# Patient Record
Sex: Female | Born: 1980 | Race: Black or African American | Hispanic: No | Marital: Married | Smoking: Never smoker
Health system: Southern US, Community
[De-identification: ages and names within clinical notes are randomized; demographics above are authoritative.]

## PROBLEM LIST (undated history)

## (undated) DIAGNOSIS — L309 Dermatitis, unspecified: Secondary | ICD-10-CM

## (undated) DIAGNOSIS — E282 Polycystic ovarian syndrome: Secondary | ICD-10-CM

## (undated) DIAGNOSIS — T7840XA Allergy, unspecified, initial encounter: Secondary | ICD-10-CM

## (undated) DIAGNOSIS — J45909 Unspecified asthma, uncomplicated: Secondary | ICD-10-CM

## (undated) DIAGNOSIS — F32A Depression, unspecified: Secondary | ICD-10-CM

## (undated) DIAGNOSIS — N200 Calculus of kidney: Secondary | ICD-10-CM

## (undated) DIAGNOSIS — K219 Gastro-esophageal reflux disease without esophagitis: Secondary | ICD-10-CM

## (undated) DIAGNOSIS — F419 Anxiety disorder, unspecified: Secondary | ICD-10-CM

## (undated) DIAGNOSIS — G4730 Sleep apnea, unspecified: Secondary | ICD-10-CM

## (undated) DIAGNOSIS — L732 Hidradenitis suppurativa: Secondary | ICD-10-CM

## (undated) HISTORY — DX: Dermatitis, unspecified: L30.9

## (undated) HISTORY — DX: Gastro-esophageal reflux disease without esophagitis: K21.9

## (undated) HISTORY — DX: Unspecified asthma, uncomplicated: J45.909

## (undated) HISTORY — DX: Sleep apnea, unspecified: G47.30

## (undated) HISTORY — DX: Hidradenitis suppurativa: L73.2

## (undated) HISTORY — DX: Calculus of kidney: N20.0

## (undated) HISTORY — DX: Polycystic ovarian syndrome: E28.2

## (undated) HISTORY — PX: NECK SURGERY: SHX720

## (undated) HISTORY — PX: APPENDECTOMY: SHX54

## (undated) HISTORY — DX: Anxiety disorder, unspecified: F41.9

## (undated) HISTORY — DX: Allergy, unspecified, initial encounter: T78.40XA

## (undated) HISTORY — DX: Depression, unspecified: F32.A

---

## 2000-11-13 ENCOUNTER — Other Ambulatory Visit: Admission: RE | Admit: 2000-11-13 | Discharge: 2000-11-13 | Payer: Self-pay | Admitting: Obstetrics and Gynecology

## 2001-10-10 ENCOUNTER — Encounter: Admission: RE | Admit: 2001-10-10 | Discharge: 2002-01-08 | Payer: Self-pay | Admitting: Family Medicine

## 2001-10-23 ENCOUNTER — Ambulatory Visit (HOSPITAL_COMMUNITY): Admission: RE | Admit: 2001-10-23 | Discharge: 2001-10-23 | Payer: Self-pay | Admitting: Endocrinology

## 2003-07-09 ENCOUNTER — Encounter: Admission: RE | Admit: 2003-07-09 | Discharge: 2003-10-07 | Payer: Self-pay | Admitting: Endocrinology

## 2005-05-30 ENCOUNTER — Emergency Department (HOSPITAL_COMMUNITY): Admission: EM | Admit: 2005-05-30 | Discharge: 2005-05-30 | Payer: Self-pay | Admitting: Emergency Medicine

## 2006-05-01 DIAGNOSIS — N200 Calculus of kidney: Secondary | ICD-10-CM

## 2006-05-01 HISTORY — PX: LITHOTRIPSY: SUR834

## 2006-05-01 HISTORY — DX: Calculus of kidney: N20.0

## 2006-09-09 ENCOUNTER — Emergency Department (HOSPITAL_COMMUNITY): Admission: EM | Admit: 2006-09-09 | Discharge: 2006-09-09 | Payer: Self-pay | Admitting: Emergency Medicine

## 2006-09-10 ENCOUNTER — Inpatient Hospital Stay (HOSPITAL_COMMUNITY): Admission: EM | Admit: 2006-09-10 | Discharge: 2006-09-12 | Payer: Self-pay | Admitting: Emergency Medicine

## 2007-04-16 ENCOUNTER — Ambulatory Visit (HOSPITAL_COMMUNITY): Admission: RE | Admit: 2007-04-16 | Discharge: 2007-04-16 | Payer: Self-pay | Admitting: *Deleted

## 2007-08-15 ENCOUNTER — Inpatient Hospital Stay (HOSPITAL_COMMUNITY): Admission: AD | Admit: 2007-08-15 | Discharge: 2007-08-15 | Payer: Self-pay | Admitting: Obstetrics and Gynecology

## 2008-02-11 ENCOUNTER — Encounter: Admission: RE | Admit: 2008-02-11 | Discharge: 2008-02-11 | Payer: Self-pay | Admitting: Obstetrics and Gynecology

## 2008-02-14 ENCOUNTER — Inpatient Hospital Stay (HOSPITAL_COMMUNITY): Admission: AD | Admit: 2008-02-14 | Discharge: 2008-02-14 | Payer: Self-pay | Admitting: Obstetrics and Gynecology

## 2008-04-02 ENCOUNTER — Inpatient Hospital Stay (HOSPITAL_COMMUNITY): Admission: RE | Admit: 2008-04-02 | Discharge: 2008-04-05 | Payer: Self-pay | Admitting: Obstetrics and Gynecology

## 2009-04-11 ENCOUNTER — Emergency Department (HOSPITAL_COMMUNITY): Admission: EM | Admit: 2009-04-11 | Discharge: 2009-04-11 | Payer: Self-pay | Admitting: Emergency Medicine

## 2009-04-12 ENCOUNTER — Emergency Department (HOSPITAL_COMMUNITY): Admission: EM | Admit: 2009-04-12 | Discharge: 2009-04-12 | Payer: Self-pay | Admitting: Emergency Medicine

## 2009-04-19 ENCOUNTER — Ambulatory Visit (HOSPITAL_COMMUNITY): Admission: RE | Admit: 2009-04-19 | Discharge: 2009-04-19 | Payer: Self-pay | Admitting: Internal Medicine

## 2009-05-29 ENCOUNTER — Emergency Department (HOSPITAL_COMMUNITY): Admission: EM | Admit: 2009-05-29 | Discharge: 2009-05-29 | Payer: Self-pay | Admitting: Emergency Medicine

## 2009-09-17 ENCOUNTER — Emergency Department (HOSPITAL_COMMUNITY): Admission: EM | Admit: 2009-09-17 | Discharge: 2009-09-17 | Payer: Self-pay | Admitting: Emergency Medicine

## 2010-03-16 ENCOUNTER — Emergency Department (HOSPITAL_COMMUNITY): Admission: EM | Admit: 2010-03-16 | Discharge: 2010-03-16 | Payer: Self-pay | Admitting: Emergency Medicine

## 2010-07-18 LAB — URINE MICROSCOPIC-ADD ON

## 2010-07-18 LAB — URINALYSIS, ROUTINE W REFLEX MICROSCOPIC
Bilirubin Urine: NEGATIVE
Glucose, UA: NEGATIVE mg/dL
Ketones, ur: NEGATIVE mg/dL
Nitrite: NEGATIVE
Protein, ur: NEGATIVE mg/dL
Specific Gravity, Urine: 1.02 (ref 1.005–1.030)
Urobilinogen, UA: 0.2 mg/dL (ref 0.0–1.0)

## 2010-08-02 LAB — URINALYSIS, ROUTINE W REFLEX MICROSCOPIC
Bilirubin Urine: NEGATIVE
Glucose, UA: NEGATIVE mg/dL
Glucose, UA: NEGATIVE mg/dL
Nitrite: NEGATIVE
Protein, ur: 30 mg/dL — AB
Protein, ur: NEGATIVE mg/dL
Specific Gravity, Urine: 1.02 (ref 1.005–1.030)
Urobilinogen, UA: 0.2 mg/dL (ref 0.0–1.0)
Urobilinogen, UA: 0.2 mg/dL (ref 0.0–1.0)
pH: 5.5 (ref 5.0–8.0)

## 2010-08-02 LAB — BASIC METABOLIC PANEL
BUN: 10 mg/dL (ref 6–23)
Chloride: 105 mEq/L (ref 96–112)
GFR calc non Af Amer: >60 mL/min (ref >60)
Potassium: 4.1 mEq/L (ref 3.5–5.1)
Sodium: 138 mEq/L (ref 135–145)

## 2010-08-02 LAB — DIFFERENTIAL
Basophils Absolute: 0 10*3/uL (ref 0.0–0.1)
Basophils Relative: 0 % (ref 0–1)
Eosinophils Absolute: 0.1 10*3/uL (ref 0.0–0.7)
Lymphs Abs: 1.8 10*3/uL (ref 0.7–4.0)
Monocytes Relative: 7 % (ref 3–12)
Neutro Abs: 4.7 10*3/uL (ref 1.7–7.7)

## 2010-08-02 LAB — CBC
HCT: 41 % (ref 36.0–46.0)
Hemoglobin: 13.8 g/dL (ref 12.0–15.0)
RBC: 5.31 MIL/uL — ABNORMAL HIGH (ref 3.87–5.11)
RDW: 15.3 % (ref 11.5–15.5)

## 2010-08-02 LAB — URINE MICROSCOPIC-ADD ON

## 2010-08-02 LAB — URINE CULTURE

## 2010-08-02 LAB — PREGNANCY, URINE: Preg Test, Ur: NEGATIVE

## 2010-09-13 NOTE — H&P (Signed)
NAMEGARIELLE, MROZ              ACCOUNT NO.:  [id number]   MEDICAL RECORD NO.:  [id number]          PATIENT TYPE:  INP   LOCATION:  9170                          FACILITY:  [facility]   PHYSICIAN:  Hal Morales, M.D.DATE OF BIRTH:  [birth year]   DATE OF ADMISSION:  04/02/2008  DATE OF DISCHARGE:                              HISTORY & PHYSICAL   HISTORY:  Ms. Sonya Olson is a 30 year old married black female primigravida  at 38-5/7 weeks who presents this morning for a scheduled induction with  favorable cervix.  She has been followed by MD service at [facility].  She was  a transfer of care from [facility] around [redacted] weeks gestation.   PAST MEDICAL HISTORY:  Her history has been remarkable for:  1. Hemoglobin C trait.  2. Right ovarian cyst.  3. History of PCOS.  4. Obese.  5. Group beta strep positive.  6. Positive LAC.  7. Proteinuria.  8. History of kidney stones.  9. Family history of high blood pressure.   She is without complaints on morning of admission.  Denies PIH or UTI  signs or symptoms.  She is having some regular contractions.  They are  about every 3-4 minutes apart.  She reports good fetal movement.  No  leakage of fluid or vaginal bleeding.  Does voice desire for early  epidural.   OBSTETRICAL HISTORY:  She is a primigravida.   LABORATORY DATA:  Prenatal labs:  Blood type is O+, Rh antibody screen  negative.  Sickle cell is negative.  However, hemoglobinopathy of  hemoglobin C trait.  RPR nonreactive.  Rubella titer immune.  Hepatitis  surface antigen negative.  HIV nonreactive.  She had a quad screen which  was within normal limits.  Her hemoglobin on October 25, 2007 was 11.4,  hematocrit 33.2, platelets were 217.  She had a 2 hour glucose tolerance  test fasting that was 96, 1 hour was 176, 2 hour was 161.   ALLERGIES:  DENIES MEDICATION OR LATEX ALLERGY.  No other sensitivities.   MENSTRUAL HISTORY:  She reports menarche at 17.  She has had irregular  cycles.   Diagnosed with PCOS.  Reported an LMP of July 06, 2007, but  best [facility] April 11, 2008 by an ultrasound August 15, 2007.   She reports use of birth control pills as well as condoms for  contraception in the past, specifically Alesse.  She had an abnormal Pap  smear 2004-2005.  Repeat was done and was normal.  Ovarian cyst during  the pregnancy was 10 cm.  On one ultrasound had previously been 3 cm.  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  She took Clomid 50  and then Femara.  She took some infertility shots at [facility].  Treated  for chlamydia 2004-2005 and frequent yeast infection, varicella as a  child.   GENETIC HISTORY:  Unremarkable.   FAMILY HISTORY:  Mother, maternal grandmother, maternal grandfather  heart attack, otherwise those folks have cardiovascular disease.  Sister  anemia.  Maternal grandmother and mom diabetic.  Sister thyroid  dysfunction.  Sister renal failure  at 54 years of age.  Maternal  grandmother CVA and maternal uncle schizophrenia.  Maternal aunt and  uncle alcohol abuse.   SOCIAL HISTORY:  She is a married African American female.  Her  husband's name is Serena Petterson III.  He is involved and supportive.  The  patient is a Herbalist.  The father of the baby works full-  time at [employer].  He has had 15 years of education.  The patient has  a bachelor's degree.  She denies alcohol, tobacco or illicit drug use.   HISTORY OF PRESENT PREGNANCY:  She reported a pregravid weight of 240.  She is 5 feet 6-3/4 inches.  She initiated care at [facility] and  then later transferred care to [place] at around 22 weeks.  Around  mid  August, she had new OB workup December 13, 2007.  Her weight at the time  was 270.  She had trace protein at that time.  She was measuring 2 weeks  ahead of gestational age.  The patient had an ultrasound at 24-3/7  weeks, single intrauterine pregnancy, vertex, normal fluid.  Gestational  age was 90 and 2 which was  consistent with dating.  Cervical length was  greater than 3 cm.  Positive urine culture and was prescribed Macrobid  for 7 days.  Started complaining of dependent edema in lower extremities  around December 31, 2007.  Did start having 1+ proteinuria around 25-3/7  weeks and 24 hour urine was completed.  She was encouraged to obtain  compression hose for edema.  Did complain of some heartburn and was  prescribed Zantac b.i.d.  At 26-3/7 weeks, she was prescribed Septra DS  for 14 days for continued persisting UTI and plan was made for her to  have prophylaxis dose secondary to continued UTI for a 24 hour urine  that she had done previously showed 189 of total protein for the 24  hours.  Creatinine clearance was 258 mL per minute.  CMP was normal  except for her albumin was 3.4.  She complained of some upper  respiratory complaints and was treated with Tamiflu at that time, it was  on January 08, 2008 and was taken out of work for 1 week.   The patient did have a positive lupus anticoagulant screen at around 26-  3/7 weeks which  plan was made to have it repeated in 6 weeks.  At 28-  3/7 weeks, she had her Glucola.  Urine culture was sent.  Hemoglobin was  11.4.  She was switched to PreferaOB.  Had an ultrasound at 29-5/7 weeks  secondary to size greater than dates.  Baby was measuring in the 72nd  percentile, weighing 3 pounds 3 ounces, vertex, cervical length 4.4 cm  and normal fluid.  She was prescribed Ambien at that time for insomnia.  Three hour GTT was done that day and was within normal limits except for  1 elevated reading.  The 1 hour was high equal to 202.  The patient was  instructed after that on proper nutrition and diet.  She was to have  fasting blood sugars and 2 hour PCs every 4 weeks.  A growth ultrasound  planned for 32 and 36 weeks.  She had a repeat ultrasound at 31-6/7  weeks.  AFI was less than third percentile 6.76, BPP was 8/8, vertex,  posterior placenta, 4  pounds 1 ounce which was 67th percentile.  She had  a repeat 24 hour urine that was  handed in on that same day.  At 32-6/7  weeks, she had a repeat ultrasound and AFI was still less than third  percentile, it was 5.69.  She was encouraged to increase her water  intake.  She had a reactive NST.  She did start antenatal testing with  every other week ultrasound and in between weeks for twice a week NSTs.   She was prescribed a Z-Pak at 33-4/7 weeks for suspected upper  respiratory infection.  Ultrasound that day showed AFI in 20th  percentile.  BPP was 8/8.  A 24 hour urine just before that showed total  protein 193.  The patient continued spilling trace to 1+ protein in her  urine, common pregnancy discomfort and continued worsening lower  extremity edema.  Continued measuring size greater than dates with  fundal height, however, growth remained less than 90th percentile.  Fluid continued to go up and down for AFI, it was in 10th percentile at  35 weeks and plan was made for her induction today, April 02, 2008.   OBJECTIVE:  VITAL SIGNS:  On admission, initial blood pressure 115/56,  heart rate was 104, respirations were 22 and temperature 98.3.  Fetal  heart rate on admission 145, reactive, moderate variability.  Contractions every 3-4 minutes on arrival, mild-moderate on palpation.  GENERAL:  No acute distress, alert and oriented x3, pleasant.  HEENT:  Grossly intact and within normal limits.  CARDIOVASCULAR:  Regular rate and rhythm without murmur.  LUNGS:  Clear to auscultation bilaterally.  ABDOMEN:  Soft, nontender, gravid.  Fundal height slightly greater than  dates.  Cervix 2 cm, 70% and -2 station, soft, anterior with slightly  bulging membranes.  EXTREMITIES:  She has 3+ pitting edema to bilateral  lower extremities.  No clonus.  DTRs are difficult to assess, but around  1+ in arms.  SKIN:  Warm, dry and intact.  The patient does have issues with mobility  secondary to size  and discomfort.   IMPRESSION:  1. Interim pregnancy at 38-5/7 weeks.  2. Group B strep positive.  3. Proteinuria.  4. Favorable cervix and for induction.   PLAN:  1. Admit to birthing suites with Dr. Pennie Rushing as attending physician.  2. Routine L&D orders.  3. Low-dose Pitocin IV per protocol.  4. Penicillin G IV per GBS prophylaxis protocol and MD to follow.      Candice Keokee, [place]      Hal Morales, M.D.  Electronically Signed    CHS/MEDQ  D:  04/02/2008  T:  04/02/2008  Job:  [id number]

## 2010-09-13 NOTE — H&P (Signed)
[name], BEHRLE            ACCOUNT NO.:  [id number]   MEDICAL RECORD NO.:  [id number]          PATIENT TYPE:  INP   LOCATION:  A303                          FACILITY:  [facility]   PHYSICIAN:  Dennie Maizes, M.D.   DATE OF BIRTH:  1980-11-30   DATE OF ADMISSION:  09/10/2006  DATE OF DISCHARGE:  LH                              HISTORY & PHYSICAL   CHIEF COMPLAINT:  Severe left flank pain, radiating to the front,  nausea, low grade chills.   HISTORY OF PRESENT ILLNESS:  A 30 year old female who has a past history  of recurrent urolithiasis.  She passed a stone last year.  She has been  to the emergency room at [facility] x2.  She complains of  severe left flank pain radiating to the front associated with nausea,  vomiting, as well as chills for the past two days.  She denied having  any voiding difficulty, dysuria, or gross hematuria.  She has not had  any fever.  Urinalysis in the emergency room was suggestive of a urinary  tract infection.  The patient was started on p.o. Levaquin. She was sent  home with p.o. medications.  She returned to the ER with severe  persistent pain.  Evaluation has been done with a non-contrast CT scan  of the abdomen and pelvis.  This revealed a 5-mm size left ureteropelvic  junction stone with obstruction and mild hydronephrosis.  The patient's  pain was not adequately controlled in the emergency room.  She has been  admitted to the hospital for pain control and further treatment.   PAST MEDICAL HISTORY:  1. History of polycystic ovarian disease.  2. Status post appendectomy.  3. Recurrent urolithiasis.   MEDICATIONS:  1. Birth control pills.  2. Metformin 5 mg p.o. b.i.d. \   ALLERGIES:  NONE.   PHYSICAL EXAMINATION:  GENERAL:  The patient is in moderate discomfort.  HEENT:  Normal.  NECK:  No masses.  LUNGS:  Clear to auscultation.  HEART:  Regular rate and rhythm.  No murmurs.  ABDOMEN:  Soft.  No palpable flank mass.  Moderate  left costovertebral  angle tenderness is noted.  No suprapubic tenderness.  Bladder is not  palpable.   ADMISSION LABORATORY:  BUN 9, creatinine 0.62.  CBC:  WBC 10.1,  hemoglobin 13.9, hematocrit 41.  Urinalysis:  Blood large, nitrate  positive, leukocyte esterase trace, WBC 21-50 per high-powered field,  RBCs 21-50 per high-powered field, bacteria many.  Urine pregnancy test  is negative.   IMPRESSION:  1. Left renal calculus with obstruction.  2. Left renal colic.  3. Left hydronephrosis.  4. Urinary tract infection.   PLAN:  1. Urine culture and sensitivity today.  We will start the patient on      IV Levaquin.  2. IV fluids.  3. Strain the urine for stones.  4. Discuss with the patient regarding management options.  She has      severe persistent pain.  We will proceed with cystoscopy, left      retrograde pyelogram, and left ureteral stent placement under  anesthesia in the a.m.  The obstructing stone will later be treated      with ESL after control of the urinary tract infection.      Dennie Maizes, M.D.  Electronically Signed     SK/MEDQ  D:  09/10/2006  T:  09/10/2006  Job:  [id number]

## 2010-09-13 NOTE — Op Note (Signed)
[name], [name]            ACCOUNT NO.:  [id number]   MEDICAL RECORD NO.:  [id number]          PATIENT TYPE:  INP   LOCATION:  A303                          FACILITY:  [facility]   PHYSICIAN:  Dennie Maizes, M.D.   DATE OF BIRTH:  1981-04-05   DATE OF PROCEDURE:  09/11/2006  DATE OF DISCHARGE:                               OPERATIVE REPORT   PREOPERATIVE DIAGNOSIS:  Left renal calculus with obstruction, left  hydronephrosis, left renal colic, urinary tract infection.   POSTOPERATIVE DIAGNOSIS:  Left renal calculus with obstruction, left  hydronephrosis, left renal colic, urinary tract infection.   OPERATIVE PROCEDURE:  Cystoscopy, left retrograde pyelogram and left  ureteral stent placement.   ANESTHESIA:  General.   SURGEON:  Dennie Maizes, M.D.   COMPLICATIONS:  None.   ESTIMATED BLOOD LOSS:  None.   DRAINS:  6.26 cm size left ureteral stent.   COMPLICATIONS:  None.   INDICATIONS FOR PROCEDURE:  This is a 30 year old female was admitted to  hospital severe left flank pain and chills.  X-rays revealed a 5 mm size  left renal calculus was located the ureteropelvic junction with  obstruction and hydronephrosis.  The patient also had urinary tract  infection.  She was taken to the operating room today for cystoscopy,  left retrograde pyelogram and left ureteral stent placement.   DESCRIPTION OF PROCEDURE:  General anesthesia was induced and the  patient was placed on the OR table in the dorsal lithotomy position.  The lower abdomen and genitalia were prepped and draped in a sterile  fashion.  Cystoscopy was done with a 25-French scope.  Appearance of the  bladder was normal.  The trigone ureteral orifice and bladder mucosa  were unremarkable.   A 5-French French catheter was then placed the left ureteral orifice.  About 7 mL of Renografin 60 was injected the collecting system and  retrograde pyelogram was done by C-arm fluoroscopy.  The distal ureter  was normal.   There was a filling defect about 5 mm in size in the  proximal ureter at the level of ureteropelvic junction with obstruction  and hydronephrosis.   A 5-French open-ended catheter was then placed in the left distal  ureter.  A 0.038-gauge Bentson guide was then inserted the left  collecting system without any difficulty.   Open-ended catheter was then removed.  A 6 French 26 cm size stent was  then placed over the guidewire inserted into the left collecting system.  The cystoscope was removed.  The patient was transferred to the PACU in  a satisfactory condition.      Dennie Maizes, M.D.  Electronically Signed     SK/MEDQ  D:  09/11/2006  T:  09/11/2006  Job:  [id number]

## 2010-09-16 NOTE — Discharge Summary (Signed)
Sonya Olson, Sonya Olson            ACCOUNT NO.:  [id number]   MEDICAL RECORD NO.:  [id number]          PATIENT TYPE:  INP   LOCATION:  A303                          FACILITY:  [facility]   PHYSICIAN:  Dennie Maizes, M.D.   DATE OF BIRTH:  08-15-1980   DATE OF ADMISSION:  09/10/2006  DATE OF DISCHARGE:  05/14/2008LH                               DISCHARGE SUMMARY   FINAL DIAGNOSES:  1. Left renal calculus with obstruction.  2. Left renal colic.  3. Left hydronephrosis.  4. Possible urinary tract infection.   OPERATIVE PROCEDURE:  Cystoscopy, left retrograde pyelogram, and left  ureteral stent placement done on Sep 11, 2006.   COMPLICATIONS:  None.   DISCHARGE SUMMARY:  This 30 year old female has a past history of  recurrent urolithiasis.  She has passed a stone last.  She has been to  the emergency room at [facility] x2.  She complained of severe  left flank pain radiating to the front associated with nausea, vomiting,  as well as chills, for the past 2 days.  She denied having any voiding  difficulty, dysuria, gross hematuria.  She has not had any fever.  Urinalysis in the emergency room was suggestive of urinary tract  infection.  The patient was started on p.o. Levaquin.  She was sent home  with p.o. medication.  She returned to the ER with severe persistent  pain.  Evaluation was done with a noncontrast CT scan of abdomen and  pelvis.  This revealed a 5 mm size left ureteropelvic junction stone  with obstruction in left and mild hydronephrosis.  The patient's pain  was not adequately controlled in the emergency room.  She was admitted  to hospital for pain control and further treatment.   PAST MEDICAL HISTORY:  Past history of ovarian disease, status post  appendectomy, recurrent urolithiasis.   MEDICATIONS:  1. Birth control pills.  2. Metformin 500 mg 1 p.o. b.i.d.   ALLERGIES:  None.   EXAMINATION:  The patient was in moderate discomfort.  HEAD, EYES, EARS,  NOSE AND THROAT:  Normal.  NECK:  No masses.  LUNGS:  Clear to auscultation.  HEART:  Regular rate and rhythm.  No murmurs.  ABDOMEN:  Soft.  Palpable flank mass.  Moderate left costovertebral  angle tenderness was noted.  No suprapubic tenderness.  Bladder was not  palpable.   ADMISSION LABS:  The BUN 9, creatinine 0.62.  The CBC - WBC 10.1,  hemoglobin 13.9, hematocrit 41.  Urinalysis:  Blood large, nitrate  positive, leukocyte esterase trace, WBCs 21-50 per high power field,  RBCs 21-50 per high-powered field, bacteria many.  Urine pregnancy test  was negative.  Urine culture and sensitivity was done and the patient  was started on IV Levaquin.   The patient continued to have intermittent severe left flank pain.  I  discussed with her about the management options.  She was taken to the  OR on Sep 11, 2006.  Cystoscopy, left retrograde pyelogram, and left  ureteral stent placement were done under general anesthesia.  The  patient has considerable pain even after the stent  placement.  She was  in turn on narcotics.  On Sep 12, 2006, she was found to have good pain  relief.  She was discharged and sent home.  She was given Cipro, 500 mg  p.o. b.i.d. for 7 days, and Percocet, 5/325, one p.o. q.8 h. p.r.n.  pain, #20.  She will be reviewed in the office and ESL of the renal  calculus will be done as an outpatient.  The condition of the patient at  the time of discharge was stable.  She was advised to call me for fever,  chills, voiding difficulty, or gross hematuria.      Dennie Maizes, M.D.  Electronically Signed     SK/MEDQ  D:  10/01/2006  T:  10/01/2006  Job:  [id number]

## 2011-01-24 LAB — URINALYSIS, ROUTINE W REFLEX MICROSCOPIC
Bilirubin Urine: NEGATIVE
Hgb urine dipstick: NEGATIVE
Ketones, ur: NEGATIVE
Nitrite: NEGATIVE
Protein, ur: NEGATIVE
Specific Gravity, Urine: >1.03 — ABNORMAL HIGH
Urobilinogen, UA: 0.2

## 2011-01-24 LAB — CBC
Hemoglobin: 12.4
MCV: 74.4 — ABNORMAL LOW
Platelets: 250

## 2011-01-24 LAB — POCT PREGNANCY, URINE: Preg Test, Ur: POSITIVE

## 2011-01-30 LAB — COMPREHENSIVE METABOLIC PANEL
AST: 19
Albumin: 2.4 — ABNORMAL LOW
Alkaline Phosphatase: 84
BUN: 5 — ABNORMAL LOW
Chloride: 105
GFR calc Af Amer: >60
Potassium: 3.6
Total Bilirubin: 0.2 — ABNORMAL LOW
Total Protein: 5.4 — ABNORMAL LOW

## 2011-01-30 LAB — LUPUS ANTICOAGULANT PANEL: Lupus Anticoagulant: NOT DETECTED

## 2011-01-30 LAB — URINE CULTURE: Colony Count: 70000

## 2011-02-03 LAB — CBC
HCT: 28.3 % — ABNORMAL LOW (ref 36.0–46.0)
Hemoglobin: 11.3 g/dL — ABNORMAL LOW (ref 12.0–15.0)
MCHC: 33.3 g/dL (ref 30.0–36.0)
MCHC: 33.8 g/dL (ref 30.0–36.0)
MCV: 73.7 fL — ABNORMAL LOW (ref 78.0–100.0)
Platelets: 171 10*3/uL (ref 150–400)
RBC: 4.53 MIL/uL (ref 3.87–5.11)
RDW: 18.4 % — ABNORMAL HIGH (ref 11.5–15.5)
RDW: 18.6 % — ABNORMAL HIGH (ref 11.5–15.5)

## 2011-02-03 LAB — GLUCOSE, CAPILLARY: Glucose-Capillary: 129 mg/dL — ABNORMAL HIGH (ref 70–99)

## 2011-02-03 LAB — RPR: RPR Ser Ql: NONREACTIVE

## 2011-08-28 ENCOUNTER — Ambulatory Visit (INDEPENDENT_AMBULATORY_CARE_PROVIDER_SITE_OTHER): Payer: BC Managed Care – PPO | Admitting: Obstetrics and Gynecology

## 2011-08-28 ENCOUNTER — Encounter: Payer: Self-pay | Admitting: Obstetrics and Gynecology

## 2011-08-28 VITALS — BP 124/70 | Temp 97.9°F | Ht 66.5 in | Wt 306.0 lb

## 2011-08-28 DIAGNOSIS — Z331 Pregnant state, incidental: Secondary | ICD-10-CM

## 2011-08-28 DIAGNOSIS — E282 Polycystic ovarian syndrome: Secondary | ICD-10-CM | POA: Insufficient documentation

## 2011-08-28 DIAGNOSIS — IMO0001 Reserved for inherently not codable concepts without codable children: Secondary | ICD-10-CM

## 2011-08-28 DIAGNOSIS — O10019 Pre-existing essential hypertension complicating pregnancy, unspecified trimester: Secondary | ICD-10-CM

## 2011-08-28 DIAGNOSIS — O10919 Unspecified pre-existing hypertension complicating pregnancy, unspecified trimester: Secondary | ICD-10-CM

## 2011-08-28 DIAGNOSIS — IMO0002 Reserved for concepts with insufficient information to code with codable children: Secondary | ICD-10-CM

## 2011-08-28 DIAGNOSIS — Z01419 Encounter for gynecological examination (general) (routine) without abnormal findings: Secondary | ICD-10-CM

## 2011-08-28 DIAGNOSIS — B356 Tinea cruris: Secondary | ICD-10-CM

## 2011-08-28 DIAGNOSIS — Z124 Encounter for screening for malignant neoplasm of cervix: Secondary | ICD-10-CM

## 2011-08-28 MED ORDER — METFORMIN HCL ER 500 MG PO TB24: 500.0000 mg | ORAL_TABLET | Freq: Every day | ORAL | Status: DC

## 2011-08-28 MED ORDER — LEVONORGESTREL-ETHINYL ESTRAD 0.1-20 MG-MCG PO TABS: 1.0000 | ORAL_TABLET | Freq: Every day | ORAL | Status: DC

## 2011-08-28 MED ORDER — CLOTRIMAZOLE-BETAMETHASONE 1-0.05 % EX CREA: TOPICAL_CREAM | Freq: Two times a day (BID) | CUTANEOUS | Status: AC

## 2011-08-28 NOTE — Progress Notes (Signed)
Addended by: Henreitta Leber on: 08/28/2011 11:49 AM   Modules accepted: Orders

## 2011-08-28 NOTE — Progress Notes (Signed)
Last Pap:09/28/2010 WNL  Regular Periods:no PT STATES THEY HAVE NEVER BEEN REGULAR Monthly Breast exam:yes Tetanus<17yrs:yes Nl.Bladder Function:yes Daily BMs:yes Healthy Diet:yes Calcium:no Mammogram:yes Exercise:yes Seatbelt: yes Abuse at home: yes Stressful work:yes Sigmoid-colonoscopy: NEVER

## 2011-08-28 NOTE — Progress Notes (Signed)
Subjective:    Sonya Olson is a 31 y.o. female, G1P1001, who presents for an annual exam. The patient reports:no complaints    History   Social History  . Marital Status: Married    Spouse Name: N/A    Number of Children: N/A  . Years of Education: N/A   Social History Main Topics  . Smoking status: Never Smoker   . Smokeless tobacco: Never Used  . Alcohol Use: No  . Drug Use: No  . Sexually Active: Yes -- Female partner(s)    Birth Control/ Protection: Pill     ALESSE   Other Topics Concern  . None   Social History Narrative  . None    Last mammogram: was normal Last pap: was normal  2012  Menstrual cycle:   LMP: Patient's last menstrual period was 08/24/2011.           Cycle is monthly with normal flow                                  and without intermenstrual bleeding or severe dysmenorrhea  Review of Systems Pertinent items are noted in HPI. Denies pelvic pain, uti symptoms, vaginitis symptoms, irregular bleeding, menopausal symptoms or rectal bleeding   Objective:    BP 124/70  Temp(Src) 97.9 F (36.6 C) (Oral)  Ht 5' 6.5" (1.689 m)  Wt 306 lb (138.801 kg)  BMI 48.65 kg/m2  LMP 08/24/2011 Weight:  Wt Readings from Last 1 Encounters:  08/28/11 306 lb (138.801 kg)   BMI: Body mass index is 48.65 kg/(m^2). General Appearance: Alert, appropriate appearance for age. No acute distress HEENT: Grossly normal Neck / Thyroid: Supple, no masses, nodes or enlargement Lungs: clear to auscultation bilaterally Back: No CVA tenderness Breast Exam: No masses or nodes.No dimpling, nipple retraction or discharge. Cardiovascular: Regular rate and rhythm. S1, S2, no murmur Gastrointestinal: Soft, non-tender, no masses or organomegaly Pelvic Exam: Vaginal: normal mucosa without prolapse or lesions Cervix: normal appearance Adnexa: normal bimanual exam Uterus: normal single, nontender Exam limited by anxiety and body habitus  Rectovaginal: not indicated Lymphatic  Exam: Non-palpable nodes in neck, clavicular, axillary, or inguinal regions Skin: no rash or abnormalities Extremities: no redness or tenderness in the calves or thighs, no edema negative Homan's  Neurologic: grossly normal Psychiatric: Alert and oriented, appropriate affect.  Natal Cleft: medial aspect with pigmented, well circumscribed rash and area of excoriations  Wet Prep:not applicable Urinalysis:not applicable UPT: Not done   Assessment:    Normal gyn exam Tinea Cruris    Plan:  Refill on Alesse #1 1 po qd 12 refills pap smear return annually or prn Lotrisone Cream 30 grams topically twice daily for 7-14 days Decrease soap and hot water use.  Discontinue Hibiclens   Jhalen Eley,ELMIRAPA-C

## 2011-08-28 NOTE — Progress Notes (Signed)
Addended byWinfred Leeds on: 08/28/2011 01:10 PM   Modules accepted: Orders

## 2011-08-30 LAB — PAP IG W/ RFLX HPV ASCU

## 2012-03-26 DIAGNOSIS — L0291 Cutaneous abscess, unspecified: Secondary | ICD-10-CM | POA: Insufficient documentation

## 2012-04-09 DIAGNOSIS — L729 Follicular cyst of the skin and subcutaneous tissue, unspecified: Secondary | ICD-10-CM | POA: Insufficient documentation

## 2012-11-18 ENCOUNTER — Emergency Department (HOSPITAL_COMMUNITY)
Admission: EM | Admit: 2012-11-18 | Discharge: 2012-11-18 | Disposition: A | Discharge status: Home / Self Care | Payer: 59 | Attending: Emergency Medicine | Admitting: Emergency Medicine

## 2012-11-18 ENCOUNTER — Encounter (HOSPITAL_COMMUNITY): Payer: Self-pay | Admitting: *Deleted

## 2012-11-18 DIAGNOSIS — E669 Obesity, unspecified: Secondary | ICD-10-CM | POA: Insufficient documentation

## 2012-11-18 DIAGNOSIS — Z9889 Other specified postprocedural states: Secondary | ICD-10-CM | POA: Insufficient documentation

## 2012-11-18 DIAGNOSIS — Z79899 Other long term (current) drug therapy: Secondary | ICD-10-CM | POA: Insufficient documentation

## 2012-11-18 DIAGNOSIS — L0211 Cutaneous abscess of neck: Secondary | ICD-10-CM

## 2012-11-18 DIAGNOSIS — E282 Polycystic ovarian syndrome: Secondary | ICD-10-CM | POA: Insufficient documentation

## 2012-11-18 DIAGNOSIS — L03221 Cellulitis of neck: Secondary | ICD-10-CM | POA: Insufficient documentation

## 2012-11-18 MED ORDER — FENTANYL CITRATE 0.05 MG/ML IJ SOLN
100.0000 ug | Freq: Once | INTRAMUSCULAR | Status: AC
  Administered 2012-11-18: 100 ug via NASAL
  Filled 2012-11-18: qty 2

## 2012-11-18 MED ORDER — LIDOCAINE-EPINEPHRINE (PF) 2 %-1:200000 IJ SOLN
INTRAMUSCULAR | Status: AC
  Filled 2012-11-18: qty 20

## 2012-11-18 MED ORDER — HYDROCODONE-ACETAMINOPHEN 5-325 MG PO TABS: 2.0000 | ORAL_TABLET | ORAL | Status: DC | PRN

## 2012-11-18 MED ORDER — LIDOCAINE-EPINEPHRINE (PF) 2 %-1:200000 IJ SOLN
INTRAMUSCULAR | Status: AC
  Administered 2012-11-18: 20 mL
  Filled 2012-11-18: qty 20

## 2012-11-18 NOTE — ED Notes (Signed)
Pt had surgery to remove cyst from neck on December 19th. Pt has another cyst on neck, was seen at [facility] & advised to come to the ER to be drained.

## 2012-11-18 NOTE — ED Provider Notes (Signed)
History    CSN: [id number] Arrival date & time 11/18/12  2110  First MD Initiated Contact with Patient 11/18/12 2142     Chief Complaint  Patient presents with  . Abscess   (Consider location/radiation/quality/duration/timing/severity/associated sxs/prior Treatment) HPI This 32 year old female has a history of a cyst removal from her posterior neck in the past, she now has several days of posterior neck tenderness and swelling and was referred to the emergency department for possible abscess needing drainage from an urgent care tonight, upon arrival to the emergency department she does have a small amount of spontaneous purulent drainage from her neck abscess, she is no fever no headache no stiff neck no breathing difficulty no chest pain no cough no shortness of breath no abdominal pain no vomiting no back pain. She is well localized moderately severe pain and tenderness to her posterior neck skin the abscess area only. There is no treatment prior to arrival. Past Medical History  Diagnosis Date  . PCOS (polycystic ovarian syndrome)    obesity Past Surgical History  Procedure Laterality Date  . Appendectomy    . Neck surgery     Family History  Problem Relation Age of Onset  . Hypertension Mother   . Diabetes Mother   . Thyroid disease Sister   . Kidney disease Sister   . Cancer Sister     OVARIAN  . Asthma Daughter   . Hypertension Maternal Uncle   . Diabetes Maternal Uncle   . Stroke Maternal Grandmother   . Hypertension Maternal Grandfather   . Mental illness Maternal Uncle    History  Substance Use Topics  . Smoking status: Never Smoker   . Smokeless tobacco: Never Used  . Alcohol Use: No   OB History   Grav Para Term Preterm Abortions TAB SAB Ect Mult Living   1 1 1       1      Review of Systems 10 Systems reviewed and are negative for acute change except as noted in the HPI. Allergies  Review of patient's allergies indicates no known allergies.  Home  Medications   Current Outpatient Rx  Name  Route  Sig  Dispense  Refill  . HYDROcodone-acetaminophen (NORCO) 5-325 MG per tablet   Oral   Take 2 tablets by mouth every 4 (four) hours as needed for pain. Dispense to go.   6 tablet   0   . HYDROcodone-acetaminophen (NORCO) 5-325 MG per tablet   Oral   Take 2 tablets by mouth every 4 (four) hours as needed for pain.   10 tablet   0   . levonorgestrel-ethinyl estradiol (AVIANE,ALESSE,LESSINA) 0.1-20 MG-MCG tablet   Oral   Take 1 tablet by mouth daily.   1 Package   12   . metFORMIN (GLUCOPHAGE) 500 MG tablet   Oral   Take 500 mg by mouth 2 (two) times daily with a meal.          BP 135/87  Pulse 89  Temp(Src) 98.5 F (36.9 C) (Oral)  Resp 20  Ht 5\' 7"  (1.702 m)  Wt 315 lb (142.883 kg)  BMI 49.32 kg/m2  SpO2 98%  LMP 11/14/2012 Physical Exam  Nursing note and vitals reviewed. Constitutional:  Awake, alert, nontoxic appearance.  HENT:  Head: Atraumatic.  Eyes: Right eye exhibits no discharge. Left eye exhibits no discharge.  Neck: Neck supple.  Posterior neck has a several centimeter diameter palpable abscess with scant purulent spontaneous drainage with limited bedside ultrasound revealing  subcutaneous fluid collection consistent with abscess, clinically there is no surrounding cellulitis with no erythema, induration, or excessive warmth surrounding the abscess  Cardiovascular: Normal rate and regular rhythm.   No murmur heard. Pulmonary/Chest: Effort normal and breath sounds normal. No stridor. No respiratory distress. She has no wheezes. She has no rales. She exhibits no tenderness.  Abdominal: Soft. Bowel sounds are normal. She exhibits no distension. There is no tenderness. There is no rebound.  Musculoskeletal: She exhibits no tenderness.  Baseline ROM, no obvious new focal weakness.  Neurological:  Mental status and motor strength appears baseline for patient and situation.  Skin: No rash noted.  Psychiatric:  She has a normal mood and affect.    ED Course  Procedures (including critical care time) INCISION AND DRAINAGE Performed by: Hurman Horn Consent: Verbal consent obtained. Risks and benefits: risks, benefits and alternatives were discussed Time out performed prior to procedure Type: abscess with limited bedside US used to localize subcutaneous fluid collection Body area: posterior neck Anesthesia: local infiltration Incision was made with a scalpel. Local anesthetic: lidocaine 2% with epinephrine Anesthetic total: 8 ml Complexity: complex Blunt dissection to break up loculations Drainage: purulent Drainage amount: copious Packing material: none Patient tolerance: Patient tolerated the procedure well with no immediate complications.  Patient informed of clinical course, understand medical decision-making process, and agree with plan. Labs Reviewed - No data to display No results found. 1. Abscess of neck     MDM  I doubt any other EMC precluding discharge at this time including, but not necessarily limited to the following:sepsis.  Hurman Horn, MD 11/20/12 2213

## 2012-11-25 MED FILL — Oxycodone w/ Acetaminophen Tab 5-325 MG: ORAL | Qty: 6 | Status: AC

## 2013-09-03 ENCOUNTER — Ambulatory Visit (INDEPENDENT_AMBULATORY_CARE_PROVIDER_SITE_OTHER): Payer: 59

## 2013-09-03 VITALS — BP 140/81 | HR 99 | Resp 18 | Ht 67.0 in | Wt 310.0 lb

## 2013-09-03 DIAGNOSIS — R609 Edema, unspecified: Secondary | ICD-10-CM

## 2013-09-03 DIAGNOSIS — B353 Tinea pedis: Secondary | ICD-10-CM

## 2013-09-03 MED ORDER — LULICONAZOLE 1 % EX CREA: TOPICAL_CREAM | CUTANEOUS | Status: DC

## 2013-09-03 NOTE — Progress Notes (Signed)
   Subjective:    Patient ID: Sonya Olson, female    DOB: 03/02/1981, 33 y.o.   MRN: [id number]  HPI Comments: N athletes feet L B/L plantar feet D and O 1 month or more C peeling and severe itching A enclosed shoes T cleaning bath areas with bleach, multiple OTC antifungal creams       Review of Systems  Allergic/Immunologic: Positive for environmental allergies.       Pollen, and dust, cats       Objective:   Physical Exam Neurovascular status is intact with pedal pulses palpable DP and PT +2/4 bilateral capillary fill time 3 seconds all digits is normal plantar response and DTRs noted neurologically skin color pigment normal hair growth absent patient does have some significant peripheral edema both lower extremities did recommend compression stockings. Orthopedic biomechanical exam rectus foot type mild digital contractures noted flexible nature. Patient does have Amoxil distribution of dry scaling fissuring skin with interdigital maceration and fissuring and some proximal distal sulcus blistering in the past. No active blisters discharge or drainage are noted at this time. No significant malodor is noted. Patient does have considerable or pruritus and itching. Has tried topical antifungal is using Lamisil cream or other Lotrimin type preparations for 4-6 weeks although not consistent orthopedic biomechanical exam unremarkable remainder of exam is unremarkable no open wounds or ulcerations are noted.       Assessment & Plan:  Assessment this time #1 tinea pedis with Amoxil distribution of the foot also has venous insufficiency and edema would benefit from some compression stockings to give recommendations for support hose can be obtained at elastic therapy and [place]. Plan at this time patient placed on a regimen of prescription for luzu topical antifungal. A prescription for did to the mailing pharmacy with coupon. Patient will initiate daily or twice daily  applications for 6-8 week duration as instructed. Followup in the next 2-3 months if it fails to improve or resolve also recommendations for treatment of shoes suggest a spray antifungal such as cold bother Tinactin spray for shoes and using hot water and bleach to wash white cotton or Kerlix socks avoid nylon or other materials that increased moisture perspiration patient is advised however fungal infections can recur suggest followup in 3 months for reassessment if no improvement next  [facility] [place] Merin Borjon DPM

## 2013-09-03 NOTE — Patient Instructions (Addendum)
Athlete's Foot Athlete's foot (tinea pedis) is a fungal infection of the skin on the feet. It often occurs on the skin between the toes or underneath the toes. It can also occur on the soles of the feet. Athlete's foot is more likely to occur in hot, humid weather. Not washing your feet or changing your socks often enough can contribute to athlete's foot. The infection can spread from person to person (contagious). CAUSES Athlete's foot is caused by a fungus. This fungus thrives in warm, moist places. Most people get athlete's foot by sharing shower stalls, towels, and wet floors with an infected person. People with weakened immune systems, including those with diabetes, may be more likely to get athlete's foot. SYMPTOMS   Itchy areas between the toes or on the soles of the feet.  White, flaky, or scaly areas between the toes or on the soles of the feet.  Tiny, intensely itchy blisters between the toes or on the soles of the feet.  Tiny cuts on the skin. These cuts can develop a bacterial infection.  Thick or discolored toenails. DIAGNOSIS  Your caregiver can usually tell what the problem is by doing a physical exam. Your caregiver may also take a skin sample from the rash area. The skin sample may be examined under a microscope, or it may be tested to see if fungus will grow in the sample. A sample may also be taken from your toenail for testing. TREATMENT  Over-the-counter and prescription medicines can be used to kill the fungus. These medicines are available as powders or creams. Your caregiver can suggest medicines for you. Fungal infections respond slowly to treatment. You may need to continue using your medicine for several weeks. PREVENTION   Do not share towels.  Wear sandals in wet areas, such as shared locker rooms and shared showers.  Keep your feet dry. Wear shoes that allow air to circulate. Wear cotton or wool socks. HOME CARE INSTRUCTIONS   Take medicines as directed by  your caregiver. Do not use steroid creams on athlete's foot.  Keep your feet clean and cool. Wash your feet daily and dry them thoroughly, especially between your toes.  Change your socks every day. Wear cotton or wool socks. In hot climates, you may need to change your socks 2 to 3 times per day.  Wear sandals or canvas tennis shoes with good air circulation.  If you have blisters, soak your feet in Burow's solution or Epsom salts for 20 to 30 minutes, 2 times a day to dry out the blisters. Make sure you dry your feet thoroughly afterward. SEEK MEDICAL CARE IF:   You have a fever.  You have swelling, soreness, warmth, or redness in your foot.  You are not getting better after 7 days of treatment.  You are not completely cured after 30 days.  You have any problems caused by your medicines. MAKE SURE YOU:   Understand these instructions.  Will watch your condition.  Will get help right away if you are not doing well or get worse. Document Released: 04/14/2000 Document Revised: 07/10/2011 Document Reviewed: 02/03/2011 [facility] Patient Information 2014 [employer] [place].   Spray shoes with the antifungal spray after every use Wash white cotton or Kerlix socks in hot water and bleach Apply the prescribed antifungal cream twice daily for 6-8 weeks as instructed to all the affected area  Compression stockings or support hose can be obtained at elastic therapy in [place]. The fact restores located behind [place]  [employer]. [address] exit.

## 2014-12-31 ENCOUNTER — Encounter: Payer: Self-pay | Admitting: *Deleted

## 2015-01-06 ENCOUNTER — Encounter: Payer: Self-pay | Admitting: Physician Assistant

## 2015-01-06 ENCOUNTER — Ambulatory Visit (INDEPENDENT_AMBULATORY_CARE_PROVIDER_SITE_OTHER): Payer: 59 | Admitting: Physician Assistant

## 2015-01-06 VITALS — BP 124/76 | HR 96 | Temp 98.6°F | Resp 20 | Ht 67.0 in | Wt 326.0 lb

## 2015-01-06 DIAGNOSIS — L732 Hidradenitis suppurativa: Secondary | ICD-10-CM | POA: Diagnosis not present

## 2015-01-06 DIAGNOSIS — Z0000 Encounter for general adult medical examination without abnormal findings: Secondary | ICD-10-CM

## 2015-01-06 DIAGNOSIS — E282 Polycystic ovarian syndrome: Secondary | ICD-10-CM

## 2015-01-06 DIAGNOSIS — J3089 Other allergic rhinitis: Secondary | ICD-10-CM | POA: Insufficient documentation

## 2015-01-06 DIAGNOSIS — Z8249 Family history of ischemic heart disease and other diseases of the circulatory system: Secondary | ICD-10-CM

## 2015-01-06 DIAGNOSIS — G4733 Obstructive sleep apnea (adult) (pediatric): Secondary | ICD-10-CM | POA: Diagnosis not present

## 2015-01-06 DIAGNOSIS — Z9109 Other allergy status, other than to drugs and biological substances: Secondary | ICD-10-CM

## 2015-01-06 DIAGNOSIS — Z91048 Other nonmedicinal substance allergy status: Secondary | ICD-10-CM | POA: Diagnosis not present

## 2015-01-06 DIAGNOSIS — E669 Obesity, unspecified: Secondary | ICD-10-CM | POA: Insufficient documentation

## 2015-01-06 MED ORDER — NALTREXONE-BUPROPION HCL ER 8-90 MG PO TB12: ORAL_TABLET | ORAL | Status: DC

## 2015-01-06 NOTE — Progress Notes (Signed)
Patient ID: Sonya Olson MRN: [id number], DOB: Mar 08, 1981, 34 y.o. Date of Encounter: 01/06/2015,   Chief Complaint: Physical (CPE)  HPI: 34 y.o. y/o AA female  here for CPE. She is also being seen as a new patient to establish care.  She states that she has not had a primary care provider in quite some time. She does see the following specialists:  Dermatologist Dr. Sharyn Lull says that they are located across from [place]. Sees them for hidradenitis (problem with sweat glands-- which she says she has this affecting all the following areas:  her axilla under her breasts, and her groins, and her neck. Says that Humira recently got the indication to be used for this and so she is on Humira for this as well as some topical medications and also the medications prescribed by the allergist help with this hidradenitis and eczema.  She also sees asthma and allergy doctor at [facility] asthma [facility] allergy. Says that she is ill has allergies to cats, dogs, pollen, dust. They prescribe Xyzal and Singulair and Claritin.  She also sees gynecologist Dr. Penni Bombard at [facility]. She has PCOS. They prescribe metformin and her birth control pills to control her PCOS.  She says that she has been diagnosed with obstructive sleep apnea and has a CPAP machine but is not compliant with using this. Says that in the past she has had problems with insomnia and sleep walking even when she was much younger. Says that she has tried different types of masks but during her sleep she takes them off without realizing it etc. and just has not been able to use this.  We also discussed her family history which is significant for: She says that her maternal grandfather died of a sudden MI at age 31. Says that he had no known/ diagnosed CAD. Says that because of that family history, her mother's brother decided at age 33, despite being asymptomatic, that he should have screening evaluation for heart disease.  Says that he was asymptomatic but when they did evaluation it was positive and he was told he had to have CABG. Says that because of the father's history and the brother's history, her mother then decided she should go be evaluated. She was also asymptomatic. Her evaluation also was positive and she also ended up having to have CABG at age 53.  Also patient states that she wanted to discuss using medication called contrary they've for weight loss. She says that when she was pregnant with her daughter she gained 60 pounds despite being on a strict diet because she was off metformin during that time. Says now with age it is getting harder and harder for her to control her weight. Is that her weight today is the heaviest she has ever been. Says that she has a friend that has recently lost 30 pounds with Contrave. She is interested in getting this medication.  No other complaints or concerns.   Review of Systems: Consitutional: No fever, chills, fatigue, night sweats, lymphadenopathy. No significant/unexplained weight changes. Eyes: No visual changes, eye redness, or discharge. ENT/Mouth: No ear pain, sore throat, nasal drainage, or sinus pain. Cardiovascular: No chest pressure,heaviness, tightness or squeezing, even with exertion. No increased shortness of breath or dyspnea on exertion.No palpitations, edema, orthopnea, PND. Respiratory: No cough, hemoptysis, SOB, or wheezing. Gastrointestinal: No anorexia, dysphagia, reflux, pain, nausea, vomiting, hematemesis, diarrhea, constipation, BRBPR, or melena. Breast: No mass, nodules, bulging, or retraction. No skin changes or inflammation. No  nipple discharge. No lymphadenopathy. Genitourinary: No dysuria, hematuria, incontinence, vaginal discharge, pruritis, burning, abnormal bleeding, or pain. Musculoskeletal: No decreased ROM, No joint pain or swelling. No significant pain in neck, back, or extremities. Skin: No rash, pruritis, or concerning  lesions. Neurological: No headache, dizziness, syncope, seizures, tremors, memory loss, coordination problems, or paresthesias. Psychological: No anxiety, depression, hallucinations, SI/HI. Endocrine: No polydipsia, polyphagia, polyuria, or known diabetes.No increased fatigue. No palpitations/rapid heart rate. No significant/unexplained weight change. All other systems were reviewed and are otherwise negative.  Past Medical History  Diagnosis Date  . PCOS (polycystic ovarian syndrome)   . Kidney stones 2008    last episode  . Allergy   . Asthma   . Hydradenitis      Past Surgical History  Procedure Laterality Date  . Appendectomy    . Neck surgery    . Lithotripsy  2008    Home Meds:  Outpatient Prescriptions Prior to Visit  Medication Sig Dispense Refill  . levocetirizine (XYZAL) 5 MG tablet Take 5 mg by mouth every evening.    . metFORMIN (GLUCOPHAGE) 500 MG tablet Take 500 mg by mouth 2 (two) times daily with a meal.     No facility-administered medications prior to visit.     Allergies: No Known Allergies  Social History   Social History  . Marital Status: Married    Spouse Name: N/A  . Number of Children: N/A  . Years of Education: N/A   Occupational History  . Not on file.   Social History Main Topics  . Smoking status: Never Smoker   . Smokeless tobacco: Never Used  . Alcohol Use: No  . Drug Use: No  . Sexual Activity:    Partners: Male    Birth Control/ Protection: Pill     Comment: ALESSE   Other Topics Concern  . Not on file   Social History Narrative   Entered 12/2014:   Married.   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    Family History  Problem Relation Age of Onset  . Hypertension Mother   . Diabetes Mother   . Heart disease Mother 26    CABG @ 18  . Thyroid disease Sister   . Kidney disease Sister     2 kidney transplants  . Cancer Sister     OVARIAN  . Asthma Daughter   . Hypertension Maternal  Uncle   . Diabetes Maternal Uncle   . Heart disease Maternal Uncle 51    CABG @ 51  . Stroke Maternal Grandmother   . Hypertension Maternal Grandfather   . Heart disease Maternal Grandfather 61    Died of MI @ 52--No Dxed CAD prior  . Mental illness Maternal Uncle   . Alcohol abuse Father     Physical Exam: Blood pressure 124/76, pulse 96, temperature 98.6 F (37 C), temperature source Oral, resp. rate 20, height 5\' 7"  (1.702 m), weight 326 lb (147.873 kg)., Body mass index is 51.05 kg/(m^2). General: Severely Obese AAF. Appears in no acute distress. HEENT: Normocephalic, atraumatic. Conjunctiva pink, sclera non-icteric. Pupils 2 mm constricting to 1 mm, round, regular, and equally reactive to light and accomodation. EOMI. Internal auditory canal clear. TMs with good cone of light and without pathology. Nasal mucosa pink. Nares are without discharge. No sinus tenderness. Oral mucosa pink.  Pharynx without exudate.   Neck: Supple. Trachea midline. No thyromegaly. Full ROM. No lymphadenopathy.No Carotid Bruits. Lungs: Clear to auscultation  bilaterally without wheezes, rales, or rhonchi. Breathing is of normal effort and unlabored. Cardiovascular: RRR with S1 S2. No murmurs, rubs, or gallops. Distal pulses 2+ symmetrically. No carotid or abdominal bruits. Breast: Per Gyn. Abdomen: Soft, non-tender, non-distended with normoactive bowel sounds. No hepatosplenomegaly or masses. No rebound/guarding. No CVA tenderness. No hernias.  Genitourinary:  Per Gyn. Musculoskeletal: Full range of motion and 5/5 strength throughout.  Skin: Warm and moist without erythema, ecchymosis, wounds, or rash. Neuro: A+Ox3. CN II-XII grossly intact. Moves all extremities spontaneously. Full sensation throughout. Normal gait. DTR 2+ throughout upper and lower extremities.  Psych:  Responds to questions appropriately with a normal affect.   Assessment/Plan:  34 y.o. y/o female here for CPE 1. Visit for preventive  health examination  A. Screening Labs: She is not fasting but states that she can easily return fasting for lab work in the near future. - CBC with Differential/Platelet; Future - COMPLETE METABOLIC PANEL WITH GFR; Future - Lipid panel; Future - TSH; Future - Vit D  25 hydroxy (rtn osteoporosis monitoring); Future  B. Pap: Per Gyn  C. Screening Mammogram: Per Gyn  D. DEXA/BMD:  Can wait until closer to age 9 to start this  E. Colorectal Cancer Screening: There is no premature family history of colorectal cancer. No indication to require this until age 22.  F. Immunizations:  Influenza:--------N/A---Sept 7th----wait until later in October to get flu vaccine Tetanus:----->10 years--she is agreeable to receive this today-----however when I went to get the nurse to give this she states that we just ran out of this and it has been ordered and will be here tomorrow. ---------------TOLD PT TO GET TDAP WHEN SHE RETURNS FOR FASTING LABS----------------------- Pneumococcal: She has no indication to require this until age 43 Zostavax: Not indicated until age 70    2. Severe obesity (BMI >= 40) - Naltrexone-Bupropion HCl ER (CONTRAVE) 8-90 MG TB12; Week 1: 1 tablet in the morning Week 2: 1 tablet in the morning, 1 tablet at night Week 3: 2 tablet in the morning, 1 tablet at night Week 4: 2 tablets in the morning, 2 tablets at night  Dispense: 70 tablet; Refill: 0 She is to take the Contrave as directed above. She is to schedule follow-up office visit here for 4 weeks from now for follow-up. She is to follow-up sooner if she has any adverse effects with the medication.  3. Family history of premature coronary artery disease She says that her maternal grandfather died of a sudden MI at age 24. Says that he had no known/ diagnosed CAD. Says that because of that family history, her mother's brother decided at age 80, despite being asymptomatic, that he should have screening evaluation for  heart disease. Says that he was asymptomatic but when they did evaluation it was positive and he was told he had to have CABG. Says that because of the father's history and the brother's history, her mother then decided she should go be evaluated. She was also asymptomatic. Her evaluation also was positive and she also ended up having to have CABG at age 49. --- Because of these issues I do feel that weight loss is very important for her and therefore prescribing Contrave today.  4. PCOS (polycystic ovarian syndrome) --- Because of these comorbidities, I do feel that weight loss is very important for her and therefore prescribing Contrave today Remainder of PCOS treatment is being managed by GYN  5. OSA (obstructive sleep apnea) --- Because of these comorbidities, I do  feel that weight loss is very important for her and therefore prescribing Contrave today  6. Environmental allergies [facility] They prescribe her Xyzal Singulair and Claritin  7. Hydradenitis Managed by Dermatologist. On Humira for this   She is to return fasting for fasting labs. She is to return for T dap. She is to return for 1 month follow-up office visit regarding Contrave / weight loss.  [address] [place], [place], [facility] 01/06/2015 4:54 PM

## 2015-02-08 ENCOUNTER — Ambulatory Visit: Payer: 59 | Admitting: Physician Assistant

## 2015-08-10 LAB — HM PAP SMEAR

## 2015-08-11 ENCOUNTER — Encounter: Payer: Self-pay | Admitting: *Deleted

## 2015-11-12 ENCOUNTER — Encounter: Payer: Self-pay | Admitting: Allergy and Immunology

## 2015-11-12 ENCOUNTER — Ambulatory Visit (INDEPENDENT_AMBULATORY_CARE_PROVIDER_SITE_OTHER): Payer: 59 | Admitting: Allergy and Immunology

## 2015-11-12 ENCOUNTER — Encounter (INDEPENDENT_AMBULATORY_CARE_PROVIDER_SITE_OTHER): Payer: Self-pay

## 2015-11-12 VITALS — BP 118/68 | HR 90 | Temp 98.3°F | Resp 16 | Ht 66.0 in | Wt 299.6 lb

## 2015-11-12 DIAGNOSIS — L209 Atopic dermatitis, unspecified: Secondary | ICD-10-CM | POA: Diagnosis not present

## 2015-11-12 DIAGNOSIS — J4530 Mild persistent asthma, uncomplicated: Secondary | ICD-10-CM

## 2015-11-12 DIAGNOSIS — J3089 Other allergic rhinitis: Secondary | ICD-10-CM

## 2015-11-12 NOTE — Assessment & Plan Note (Signed)
   Continue appropriate skin care measures, desonide cream, and fluocinonide cream as described.

## 2015-11-12 NOTE — Assessment & Plan Note (Addendum)
   Continue montelukast 10 mg daily at bedtime and albuterol every 4-6 hours as needed.  Subjective and objective measures of pulmonary function will be followed and the treatment plan will be adjusted accordingly.  A letter has been [place] her place of [place] [employer], regarding making accommodations to minimize allergen exposure.

## 2015-11-12 NOTE — Patient Instructions (Addendum)
Mild persistent asthma  Continue montelukast 10 mg daily at bedtime and albuterol every 4-6 hours as needed.  Subjective and objective measures of pulmonary function will be followed and the treatment plan will be adjusted accordingly.  A letter has been written to her place of [place], [employer], regarding making accommodations to minimize allergen exposure.  Perennial and seasonal allergic rhinitis Sonya RungMonique is an excellent candidate for aeroallergen immunotherapy given her occupation and polisensitization to perennial and seasonal aeroallergens.  Continue appropriate allergen avoidance measures, montelukast daily, levocetirizine as needed, and fluticasone nasal spray as needed.  If additional over-the-counter antihistamine is required, I have recommended cetirizine or fexofenadine rather than loratadine.  If allergen avoidance measures and medications fail to adequately relieve symptoms, aeroallergen immunotherapy will be considered.  Atopic dermatitis  Continue appropriate skin care measures, desonide cream, and fluocinonide cream as described.    Return in about 4 months (around 03/14/2016), or if symptoms worsen or fail to improve.

## 2015-11-12 NOTE — Assessment & Plan Note (Signed)
Sonya RungMonique is an excellent candidate for aeroallergen immunotherapy given her occupation and polisensitization to perennial and seasonal aeroallergens.  Continue appropriate allergen avoidance measures, montelukast daily, levocetirizine as needed, and fluticasone nasal spray as needed.  If additional over-the-counter antihistamine is required, I have recommended cetirizine or fexofenadine rather than loratadine.  If allergen avoidance measures and medications fail to adequately relieve symptoms, aeroallergen immunotherapy will be considered.

## 2015-11-12 NOTE — Progress Notes (Signed)
Follow-up Note  RE: LEILENE DIPRIMA MRN: [id number] DOB: 1980-07-13 Date of Office Visit: 11/12/2015  Primary care provider: Frazier Richards, PA-C Referring provider: Dorena Bodo, PA-C  History of present illness: HPI Comments: Dayja Loveridge is a 35 y.o. female with asthma, allergic rhinitis, gastroesophageal reflux, and atopic dermatitis presents today for follow up.  She was last seen in this clinic in December 2015.  She currently works as a Building services engineer.  She notes that her asthma and allergic rhinoconjunctivitis are exacerbated by exposure to animals, even indirect exposure to animal allergen on other people's clothing.  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  She is concerned about the implications regarding her asthma and allergy control.  If she is able to avoid cats, dogs, and pollens, her asthma is stable and well-controlled with montelukast and albuterol as needed.  She controls her nasal/ocular symptoms with montelukast, levocetirizine, loratadine, and fluticasone nasal spray.  Typically with multiple medications she is able to control her nasal/ocular symptoms except when she is around animals.  She is followed by her dermatologist for atopic dermatitis as well as hydradenitis.  She has reservations about considering aeroallergen immunotherapy because of her current treatment regimen with Remicade.   Assessment and plan: Mild persistent asthma  Continue montelukast 10 mg daily at bedtime and albuterol every 4-6 hours as needed.  Subjective and objective measures of pulmonary function will be followed and the treatment plan will be adjusted accordingly.  A letter has been written to her place of employment, [employer], regarding making accommodations to minimize allergen exposure.  Perennial and seasonal allergic rhinitis Larayah is an excellent candidate for aeroallergen immunotherapy given  her occupation and polisensitization to perennial and seasonal aeroallergens.  Continue appropriate allergen avoidance measures, montelukast daily, levocetirizine as needed, and fluticasone nasal spray as needed.  If additional over-the-counter antihistamine is required, I have recommended cetirizine or fexofenadine rather than loratadine.  If allergen avoidance measures and medications fail to adequately relieve symptoms, aeroallergen immunotherapy will be considered.  Atopic dermatitis  Continue appropriate skin care measures, desonide cream, and fluocinonide cream as described.   Diagnositics: Spirometry:  Normal with an FEV1 of 82% predicted.  Please see scanned spirometry results for details.    Physical examination: Blood pressure 118/68, pulse 90, temperature 98.3 F (36.8 C), temperature source Oral, resp. rate 16, height 5\' 6"  (1.676 m), weight 299 lb 9.7 oz (135.9 kg), SpO2 95 %.  General: Alert, interactive, in no acute distress. HEENT: TMs pearly gray, turbinates moderately edematous without discharge, post-pharynx mildly erythematous. Neck: Supple without lymphadenopathy. Lungs: Clear to auscultation without wheezing, rhonchi or rales. CV: Normal S1, S2 without murmurs. Skin: Warm and dry, without lesions or rashes.  The following portions of the patient's history were reviewed and updated as appropriate: allergies, current medications, past family history, past medical history, past social history, past surgical history and problem list.    Medication List       This list is accurate as of: 11/12/15  2:21 PM.  Always use your most recent med list.               desonide 0.05 % cream  Commonly known as:  DESOWEN  Apply 1 application topically daily as needed.     fluocinonide cream 0.05 %  Commonly known as:  LIDEX  Apply 1 application topically daily.     HUMIRA 40 MG/0.8ML Pskt  Generic drug:  Adalimumab  Inject 1 each into the skin once a week.  Reported on 11/12/2015     inFLIXimab in sodium chloride 0.9 %  Inject 5 mg/kg into the vein every 8 (eight) weeks.     levocetirizine 5 MG tablet  Commonly known as:  XYZAL  Take 5 mg by mouth every evening.     levonorgestrel-ethinyl estradiol 0.1-20 MG-MCG tablet  Commonly known as:  AVIANE,ALESSE,LESSINA  Take 1 tablet by mouth.     loratadine 10 MG tablet  Commonly known as:  CLARITIN  Take 10 mg by mouth daily.     metFORMIN 500 MG tablet  Commonly known as:  GLUCOPHAGE  Take 500 mg by mouth 2 (two) times daily with a meal.     montelukast 10 MG tablet  Commonly known as:  SINGULAIR  Take 10 mg by mouth at bedtime.     Naltrexone-Bupropion HCl ER 8-90 MG Tb12  Commonly known as:  CONTRAVE  Week 1: 1 tablet in the morning Week 2: 1 tablet in the morning, 1 tablet at night Week 3: 2 tablet in the morning, 1 tablet at night Week 4: 2 tablets in the morning, 2 tablets at night     omeprazole 20 MG capsule  Commonly known as:  PRILOSEC  Take 20 mg by mouth 2 (two) times daily before a meal.        No Known Allergies  Review of systems: Constitutional: Negative for fever, chills and weight loss.  HENT: Negative for nosebleeds.   Positive for nasal congestion, runny nose, sneezing. Eyes: Negative for blurred vision.  Respiratory: Negative for hemoptysis.   Cardiovascular: Negative for chest pain.  Positive for chest tightness, coughing, wheezing. Gastrointestinal: Negative for diarrhea and constipation.  Genitourinary: Negative for dysuria.  Musculoskeletal: Negative for myalgias and joint pain.  Neurological: Negative for dizziness.  Endo/Heme/Allergies: Does not bruise/bleed easily.  Cutaneous: Negative for rash.  Past Medical History  Diagnosis Date  . PCOS (polycystic ovarian syndrome)   . Kidney stones 2008    last episode  . Allergy   . Asthma   . Hydradenitis   . Eczema     Family History  Problem Relation Age of Onset  . Hypertension Mother   .  Diabetes Mother   . Heart disease Mother 1552    CABG @ 6052  . Thyroid disease Sister   . Kidney disease Sister     2 kidney transplants  . Cancer Sister     OVARIAN  . Asthma Daughter   . Hypertension Maternal Uncle   . Diabetes Maternal Uncle   . Heart disease Maternal Uncle 51    CABG @ 51  . Stroke Maternal Grandmother   . Hypertension Maternal Grandfather   . Heart disease Maternal Grandfather 852    Died of MI @ 52--No Dxed CAD prior  . Mental illness Maternal Uncle   . Alcohol abuse Father     Social History   Social History  . Marital Status: Married    Spouse Name: N/A  . Number of Children: N/A  . Years of Education: N/A   Occupational History  . Not on file.   Social History Main Topics  . Smoking status: Never Smoker   . Smokeless tobacco: Never Used  . Alcohol Use: No  . Drug Use: No  . Sexual Activity:    Partners: Male    Birth Control/ Protection: Pill     Comment: ALESSE   Other  Topics Concern  . Not on file   Social History Narrative   Entered 12/2014:   Married.   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    I appreciate the opportunity to take part in [facility] care. Please do not hesitate to contact me with questions.  Sincerely,   R. Jorene Guest, MD

## 2015-11-22 ENCOUNTER — Ambulatory Visit: Payer: 59 | Admitting: Allergy and Immunology

## 2016-06-23 ENCOUNTER — Ambulatory Visit (INDEPENDENT_AMBULATORY_CARE_PROVIDER_SITE_OTHER): Payer: BC Managed Care – PPO | Admitting: Family Medicine

## 2016-06-23 ENCOUNTER — Encounter: Payer: Self-pay | Admitting: Family Medicine

## 2016-06-23 DIAGNOSIS — E282 Polycystic ovarian syndrome: Secondary | ICD-10-CM | POA: Diagnosis not present

## 2016-06-23 DIAGNOSIS — L03119 Cellulitis of unspecified part of limb: Secondary | ICD-10-CM

## 2016-06-23 DIAGNOSIS — L02619 Cutaneous abscess of unspecified foot: Secondary | ICD-10-CM

## 2016-06-23 MED ORDER — NALTREXONE-BUPROPION HCL ER 8-90 MG PO TB12: ORAL_TABLET | ORAL | 2 refills | Status: DC

## 2016-06-23 MED ORDER — FLUCONAZOLE 150 MG PO TABS: ORAL_TABLET | ORAL | 1 refills | Status: DC

## 2016-06-23 MED ORDER — SULFAMETHOXAZOLE-TRIMETHOPRIM 800-160 MG PO TABS: 1.0000 | ORAL_TABLET | Freq: Two times a day (BID) | ORAL | 0 refills | Status: DC

## 2016-06-23 NOTE — Patient Instructions (Signed)
Take 1 tablet daily in the morning x 1 week THen 1 tablet twice a day for 1 week Then 2 tablets in morning and 1 in evening x 1 week Then 2 tablets in morning and 2 in the evening- and continue this  Take antibiotics as prescribed F/U 6 weeks for weight

## 2016-06-23 NOTE — Progress Notes (Signed)
   Subjective:    Patient ID: Sonya Olson, female    DOB: 20-Jan-1981, 36 y.o.   MRN: [id number]  Patient presents for bug bite on left foot   Yesterday left foot pain, did not have any particular injury, today noticed redness and hard spot withswelling on top of foot. No fever, no chills. No pain with ambulating.  Unsure if she was bitten by something   On Remicade for her severe hidraenitis   She also has change in insurance wants to try Contrave see if it is covered      Review Of Systems:  GEN- denies fatigue, fever, weight loss,weakness, recent illness HEENT- denies eye drainage, change in vision, nasal discharge, CVS- denies chest pain, palpitations RESP- denies SOB, cough, wheeze ABD- denies N/V, change in stools, abd pain GU- denies dysuria, hematuria, dribbling, incontinence MSK- denies joint pain, muscle aches, injury Neuro- denies headache, dizziness, syncope, seizure activity       Objective:    BP 118/70   Pulse 97   Temp 99 F (37.2 C) (Oral)   Resp 18   Wt 297 lb 9.6 oz (135 kg)   SpO2 98%   BMI 48.03 kg/m  GEN- NAD, alert and oriented x3 CVS- RRR, no murmur RESP-CTAB EXT- Trace pedal edema bilat, Left foot swelling top of foot, with2x3cm area of erythema with induration at center, no opening in skinseen Pulses- Radial, DP- 2+        Assessment & Plan:      Problem List Items Addressed This Visit    Severe obesity (BMI >= 40) (HCC) - Primary    Obesity with PCOS Has tried phentermine, nutritiomist, working with trainer  Will benefit from weight loss to control risk factors of heart disease    Concern for cellulitis, unclear if bug/spider bite, no streaking, she is immuncompromised on remicade, start bactrim DS she does not respond to doxycycline with her multiple treatments for hidradenitis       Relevant Medications   Naltrexone-Bupropion HCl ER (CONTRAVE) 8-90 MG TB12   PCOS (polycystic ovarian syndrome)   Relevant Medications   Naltrexone-Bupropion HCl ER (CONTRAVE) 8-90 MG TB12    Other Visit Diagnoses    Cellulitis and abscess of foot          Note: This dictation was prepared with Dragon dictation along with smaller phrase technology. Any transcriptional errors that result from this process are unintentional.

## 2016-06-24 NOTE — Assessment & Plan Note (Addendum)
Obesity with PCOS Has tried phentermine, nutritiomist, working with trainer  Will benefit from weight loss to control risk factors of heart disease    Concern for cellulitis, unclear if bug/spider bite, no streaking, she is immuncompromised on remicade, start bactrim DS she does not respond to doxycycline with her multiple treatments for hidradenitis

## 2016-07-07 ENCOUNTER — Telehealth: Payer: Self-pay | Admitting: Family Medicine

## 2016-07-07 MED ORDER — SULFAMETHOXAZOLE-TRIMETHOPRIM 800-160 MG PO TABS: 1.0000 | ORAL_TABLET | Freq: Two times a day (BID) | ORAL | 0 refills | Status: DC

## 2016-07-07 MED ORDER — HYDROCODONE-ACETAMINOPHEN 5-325 MG PO TABS: 1.0000 | ORAL_TABLET | Freq: Four times a day (QID) | ORAL | 0 refills | Status: DC | PRN

## 2016-07-07 NOTE — Telephone Encounter (Signed)
Patient came in the office today complaining of left foot pain with a pain level of 14   Examined patient foot it is slightly dark around area where the biopsy was done previously by Dr. Janyth ContesSayed, and it looks as if the foot has inflammation.  I called Dr. Janyth ContesSayed office to let them know what was going on and to see if the could see the patient today because they did previous biopsy and stated if patient foot did not get better then they would need to do another biopsy. Dr, Janyth ContesSayed was not in the office today, but they were able to schedule patient to be [facility] [place] [facility]  Per Dr. Jeanice Limurham I will send in a RX for bactrim DS. Explained to patient if her symptoms did not improve or if she felt as if the Pain medication was not helping then she would need to go to an Urgent Care or the ER, but to also make sure she follow up with the appointment on 3-12 @ 10am

## 2016-07-07 NOTE — Telephone Encounter (Signed)
Pt in office  having pain, seen by derm, had biopsy,not found to be infectious  Given Norco 5-325mg  Q 6hrs prn pain Has appt with derm on Monday  I reviewed there note  Stiches still in place   Stop Contrave while on pain medication

## 2016-08-16 LAB — HM PAP SMEAR

## 2016-08-18 LAB — HM PAP SMEAR: HM Pap smear: NORMAL

## 2016-11-02 ENCOUNTER — Encounter: Payer: 59 | Admitting: Physician Assistant

## 2016-11-03 ENCOUNTER — Encounter: Payer: Self-pay | Admitting: Physician Assistant

## 2016-11-13 ENCOUNTER — Telehealth: Payer: Self-pay | Admitting: Physician Assistant

## 2016-11-13 NOTE — Telephone Encounter (Signed)
Patient is calling to let you know that the contrave needs authorization  [phone]

## 2016-11-14 NOTE — Telephone Encounter (Signed)
Re-authorization of weight loss medication requires follow up visit to determine if medication is effective.   Call placed to patient to make aware. LMTRC.

## 2016-11-14 NOTE — Telephone Encounter (Signed)
Call placed to patient and patient made aware per VM.  

## 2016-11-14 NOTE — Telephone Encounter (Signed)
Patient calling you back and would like you to call her at [phone] a different number

## 2016-11-22 ENCOUNTER — Ambulatory Visit (INDEPENDENT_AMBULATORY_CARE_PROVIDER_SITE_OTHER): Payer: BC Managed Care – PPO | Admitting: Family Medicine

## 2016-11-22 ENCOUNTER — Encounter: Payer: Self-pay | Admitting: Family Medicine

## 2016-11-22 DIAGNOSIS — F418 Other specified anxiety disorders: Secondary | ICD-10-CM

## 2016-11-22 DIAGNOSIS — E282 Polycystic ovarian syndrome: Secondary | ICD-10-CM | POA: Diagnosis not present

## 2016-11-22 MED ORDER — NALTREXONE-BUPROPION HCL ER 8-90 MG PO TB12: ORAL_TABLET | ORAL | 2 refills | Status: DC

## 2016-11-22 NOTE — Assessment & Plan Note (Signed)
She is morbidly obese with other comorbidities. She would benefit from significant weight loss. The Contrave  may or should help with this her weight as well as her mood and the stresses that it has the Wellbutrin. We will see if we can get this covered through her insurance. If for some reason this is not covered in the next option would be Saxenda and using trazodone or sleep. Discussed this with her and she agrees. She is getting labs followed by her rheumatologist before her infusions.  Discussed some dietary changes including no meals after 8 PM she is to eat 3 meals a day if she does have a snack it should be fresh fruits or veggies. She is to cut out soda and she uses water showing main beverage. Within a follow-up in a couple of months to see how she is doing

## 2016-11-22 NOTE — Patient Instructions (Addendum)
No eating after 8pm 8 cups of water No sweets  Eat three meals Contrave 1 a day for 1 week,  Week 2- 1 tablet twice a day  Week 3- 2 tablets in the morning and 1 in the evening  Week 4- 2 tablets twice a day  F/U 2 months

## 2016-11-22 NOTE — Progress Notes (Signed)
Subjective:    Patient ID: Sonya PointerMonique N Searight, female    DOB: 01/15/1981, 36 y.o.   MRN: [id number]  Patient presents for Follow-up (is not fasting) Issue here to discuss weight loss medication. In the past she's tried phentermine to try Weight Watchers she's tried nutritionists. Her heaviest weight was 325 pounds is in a told she did lose about 32 pounds using green smoothies and some other nutritional changes. She would like to try contrary we did discuss at the last visit however she never started as she had no inflammatory type of infection on her foot where she had multiple biopsies and was on oral prednisone for about 2 months. She would now like to proceed with the medication and needs documentation for her insurance. She is cutting out sweets as well as late-night eating. She tried to increase her water.  At the end the visit she also mentioned that she has been under a lot of stress with her declining health which is when the recent issues try to get her weight down. Her sleep has been effective recently she is still working and doing her regular activities. She was noted there is anything that she could use to help with this.  Note she has had labs with her rheumatologist. Her gastroenterologist she is still on treatment for PCO S as well as hidradenitis Her last creatinine was 0.54 and hemoglobin 11.2 a few weeks ago.   Review Of Systems:  GEN- denies fatigue, fever, weight loss,weakness, recent illness HEENT- denies eye drainage, change in vision, nasal discharge, CVS- denies chest pain, palpitations RESP- denies SOB, cough, wheeze ABD- denies N/V, change in stools, abd pain GU- denies dysuria, hematuria, dribbling, incontinence MSK- denies joint pain, muscle aches, injury Neuro- denies headache, dizziness, syncope, seizure activity       Objective:    BP 112/68   Pulse 100   Temp 99.3 F (37.4 C) (Oral)   Resp 14   Ht 5\' 6"  (1.676 m)   Wt (!) 305 lb (138.3 kg)   SpO2  96%   BMI 49.23 kg/m  GEN- NAD, alert and oriented x3,obese  HEENT- PERRL, EOMI, non injected sclera, pink conjunctiva, MMM, oropharynx clear Neck- Supple, no thyromegaly CVS- RRR, no murmur RESP-CTAB ABD-NABS,soft,NT,ND Psych- Normal affect and mood, very pleasant  EXT- ankle edema bilat  Pulses- Radial, DP- 2+        Assessment & Plan:      Problem List Items Addressed This Visit    PCOS (polycystic ovarian syndrome)   Relevant Medications   Naltrexone-Bupropion HCl ER (CONTRAVE) 8-90 MG TB12   Severe obesity (BMI >= 40) (HCC) - Primary    She is morbidly obese with other comorbidities. She would benefit from significant weight loss. The Contrave  may or should help with this her weight as well as her mood and the stresses that it has the Wellbutrin. We will see if we can get this covered through her insurance. If for some reason this is not covered in the next option would be Saxenda and using trazodone or sleep. Discussed this with her and she agrees. She is getting labs followed by her rheumatologist before her infusions.  Discussed some dietary changes including no meals after 8 PM she is to eat 3 meals a day if she does have a snack it should be fresh fruits or veggies. She is to cut out soda and she uses water showing main beverage. Within a follow-up in a couple of months  to see how she is doing      Relevant Medications   Naltrexone-Bupropion HCl ER (CONTRAVE) 8-90 MG TB12    Other Visit Diagnoses    Situational anxiety          Note: This dictation was prepared with Dragon dictation along with smaller phrase technology. Any transcriptional errors that result from this process are unintentional.

## 2016-11-23 ENCOUNTER — Telehealth: Payer: Self-pay | Admitting: *Deleted

## 2016-11-23 NOTE — Telephone Encounter (Signed)
Your information has been submitted to [employer]. To check for an updated outcome later, reopen this PA request from your dashboard. If [employer] has not responded to your request within 24 hours, contact [employer] at [phone]. If you think there may be a problem with your PA request, use our live chat feature at the bottom right.    

## 2016-11-23 NOTE — Telephone Encounter (Signed)
Received request from pharmacy for PA on Contrave.   PA submitted.   Dx: E66.01- Obesity.

## 2016-11-28 NOTE — Telephone Encounter (Signed)
Received PA determination.   PA approved 11/23/2016- 03/26/2017.  Pharmacy made aware.

## 2016-12-14 ENCOUNTER — Other Ambulatory Visit: Payer: Self-pay | Admitting: Family Medicine

## 2017-01-23 ENCOUNTER — Ambulatory Visit: Payer: BC Managed Care – PPO | Admitting: Family Medicine

## 2017-01-25 ENCOUNTER — Encounter: Payer: Self-pay | Admitting: Physician Assistant

## 2017-05-09 ENCOUNTER — Other Ambulatory Visit: Payer: Self-pay

## 2017-05-09 ENCOUNTER — Encounter: Payer: Self-pay | Admitting: Family Medicine

## 2017-05-09 ENCOUNTER — Ambulatory Visit: Payer: BC Managed Care – PPO | Admitting: Family Medicine

## 2017-05-09 DIAGNOSIS — E282 Polycystic ovarian syndrome: Secondary | ICD-10-CM

## 2017-05-09 DIAGNOSIS — H1032 Unspecified acute conjunctivitis, left eye: Secondary | ICD-10-CM

## 2017-05-09 MED ORDER — LIRAGLUTIDE -WEIGHT MANAGEMENT 18 MG/3ML ~~LOC~~ SOPN: 0.6000 mg | PEN_INJECTOR | Freq: Every day | SUBCUTANEOUS | 2 refills | Status: DC

## 2017-05-09 MED ORDER — OMEPRAZOLE 20 MG PO CPDR: 20.0000 mg | DELAYED_RELEASE_CAPSULE | Freq: Two times a day (BID) | ORAL | 6 refills | Status: DC

## 2017-05-09 MED ORDER — POLYMYXIN B-TRIMETHOPRIM 10000-0.1 UNIT/ML-% OP SOLN: 1.0000 [drp] | Freq: Four times a day (QID) | OPHTHALMIC | 0 refills | Status: DC

## 2017-05-09 MED ORDER — CIPROFLOXACIN HCL 0.3 % OP SOLN: OPHTHALMIC | 0 refills | Status: DC

## 2017-05-09 NOTE — Progress Notes (Signed)
   Subjective:    Patient ID: Sonya Olson, female    DOB: Oct 23, 1980, 37 y.o.   MRN: [id number]  Patient presents for Medication Management (is not fasting)   Obesity- has PCOS, likes bread and fruit juices, has been nutritionist. She does skip meals at times On MTF for PCOS , Had SE of headache and no significant weight loss Wants to try different medication   GERD- ues omeprazole, bowels are fine, no GI upset with eating   Has done some exercise past few weeks, will walk on treadmill   Severe Hidradentitis- changing to a new infusion, planning for surgery this year in axilla, has many ares around neck,. Groin that continue to get inflammed  Left eye red with drainage, crusting/matting  Past 2 days, using eye allergy drops but did not help   Cr 0.58 BUN 11 hAS NOT HAD lipid panel in a few years unless GYN did it, A1C done at GYN    Review Of Systems:  GEN- denies fatigue, fever, weight loss,weakness, recent illness HEENT- + eye drainage, change in vision, nasal discharge, CVS- denies chest pain, palpitations RESP- denies SOB, cough, wheeze ABD- denies N/V, change in stools, abd pain GU- denies dysuria, hematuria, dribbling, incontinence MSK- denies joint pain, muscle aches, injury Neuro- denies headache, dizziness, syncope, seizure activity       Objective:    BP 114/76   Pulse 82   Temp 98.2 F (36.8 C) (Oral)   Resp 14   Ht 5\' 6"  (1.676 m)   Wt (!) 309 lb (140.2 kg)   SpO2 98%   BMI 49.87 kg/m  GEN- NAD, alert and oriented x3 HEENT- PERRL, EOMI, non injected sclera, pink conjunctiva, MMM, oropharynx clear, Left eye injected, mild drainage, vision grossly in tact  Neck- Supple, no thyromegaly, no LAD, hidradenitis post neck with scarring  CVS- RRR, no murmur RESP-CTAB EXT- pedal  edema Pulses- Radial, DP- 2+        Assessment & Plan:      Problem List Items Addressed This Visit      Unprioritized   PCOS (polycystic ovarian syndrome)   Severe  obesity (BMI >= 40) (HCC) - Primary    Obesity in the setting of PCO S.  We will obtain records from her GYN to get her last A1c.  Her renal function liver function and hemoglobin are normal recently done at her surgeon's office which I reviewed from her my chart portal. Discussed the use of Saxenda.  She would benefit from significant weight loss this will also help with her upcoming surgeries with trying to close her wounds.  He will take the medication is titrated follow-up in 6 weeks for recheck of the cholesterol panel at that time if it was not done at her GYNs office       Relevant Medications   Liraglutide -Weight Management (SAXENDA) 18 MG/3ML SOPN    Other Visit Diagnoses    Acute bacterial conjunctivitis of left eye       Polytrim drop, dispose of contact      Note: This dictation was prepared with Dragon dictation along with smaller phrase technology. Any transcriptional errors that result from this process are unintentional.

## 2017-05-09 NOTE — Assessment & Plan Note (Signed)
Obesity in the setting of PCO S.  We will obtain records from her GYN to get her last A1c.  Her renal function liver function and hemoglobin are normal recently done at her surgeon's office which I reviewed from her my chart portal. Discussed the use of Saxenda.  She would benefit from significant weight loss this will also help with her upcoming surgeries with trying to close her wounds.  He will take the medication is titrated follow-up in 6 weeks for recheck of the cholesterol panel at that time if it was not done at her GYNs office

## 2017-05-09 NOTE — Patient Instructions (Addendum)
F/U 6 weeks for weight  We will call with lab results  Release of records- [facility]- need last PAP/ SET UP  Start saxenda:  Week 1: 0.6mg  injected daily:   Week 2:  1.2 mg injected daily  Week 3:  1.8mg  injected daily  Week 4:  2.4mg  injected daily  Week 5:  3mg  injected daily

## 2017-05-10 ENCOUNTER — Telehealth: Payer: Self-pay | Admitting: *Deleted

## 2017-05-10 NOTE — Telephone Encounter (Signed)
Received request from pharmacy for PA on Saxenda.  PA submitted.   Dx: E66.09- Obesity.   Weight: 309lb Height: 66" BMI: 49.9%

## 2017-05-10 NOTE — Telephone Encounter (Signed)
Your information has been submitted to [employer]. To check for an updated outcome later, reopen this PA request from your dashboard. If [employer] has not responded to your request within 24 hours, contact [employer] at [phone]. If you think there may be a problem with your PA request, use our live chat feature at the bottom right.    

## 2017-05-15 MED ORDER — LIRAGLUTIDE -WEIGHT MANAGEMENT 18 MG/3ML ~~LOC~~ SOPN: 0.6000 mg | PEN_INJECTOR | Freq: Every day | SUBCUTANEOUS | 2 refills | Status: DC

## 2017-05-15 NOTE — Telephone Encounter (Signed)
Received PA determination.   PA Approved 05/10/2017- 09/07/2017.  Pharmacy made aware.

## 2017-05-23 ENCOUNTER — Emergency Department (HOSPITAL_COMMUNITY)
Admission: EM | Admit: 2017-05-23 | Discharge: 2017-05-23 | Disposition: A | Discharge status: Home / Self Care | Payer: BC Managed Care – PPO | Attending: Emergency Medicine | Admitting: Emergency Medicine

## 2017-05-23 ENCOUNTER — Emergency Department (HOSPITAL_COMMUNITY): Payer: BC Managed Care – PPO

## 2017-05-23 ENCOUNTER — Other Ambulatory Visit: Payer: Self-pay

## 2017-05-23 ENCOUNTER — Encounter (HOSPITAL_COMMUNITY): Payer: Self-pay | Admitting: Emergency Medicine

## 2017-05-23 DIAGNOSIS — J4530 Mild persistent asthma, uncomplicated: Secondary | ICD-10-CM | POA: Insufficient documentation

## 2017-05-23 DIAGNOSIS — N3001 Acute cystitis with hematuria: Secondary | ICD-10-CM

## 2017-05-23 DIAGNOSIS — Z79899 Other long term (current) drug therapy: Secondary | ICD-10-CM | POA: Diagnosis not present

## 2017-05-23 DIAGNOSIS — N201 Calculus of ureter: Secondary | ICD-10-CM

## 2017-05-23 DIAGNOSIS — R1032 Left lower quadrant pain: Secondary | ICD-10-CM | POA: Diagnosis present

## 2017-05-23 LAB — BASIC METABOLIC PANEL
ANION GAP: 13 (ref 5–15)
BUN: 13 mg/dL (ref 6–20)
CALCIUM: 9.3 mg/dL (ref 8.9–10.3)
CO2: 25 mmol/L (ref 22–32)
Chloride: 101 mmol/L (ref 101–111)
Creatinine, Ser: 0.77 mg/dL (ref 0.44–1.00)
GFR calc non Af Amer: >60 mL/min (ref >60)
Glucose, Bld: 125 mg/dL — ABNORMAL HIGH (ref 65–99)
POTASSIUM: 4 mmol/L (ref 3.5–5.1)
Sodium: 139 mmol/L (ref 135–145)

## 2017-05-23 LAB — CBC WITH DIFFERENTIAL/PLATELET
BASOS ABS: 0 10*3/uL (ref 0.0–0.1)
Basophils Relative: 0 %
Eosinophils Absolute: 0.3 10*3/uL (ref 0.0–0.7)
Eosinophils Relative: 2 %
HEMATOCRIT: 36 % (ref 36.0–46.0)
HEMOGLOBIN: 11.7 g/dL — AB (ref 12.0–15.0)
LYMPHS PCT: 23 %
Lymphs Abs: 3.3 10*3/uL (ref 0.7–4.0)
MCH: 22.5 pg — ABNORMAL LOW (ref 26.0–34.0)
MCHC: 32.5 g/dL (ref 30.0–36.0)
MCV: 69.1 fL — AB (ref 78.0–100.0)
Monocytes Absolute: 1.1 10*3/uL — ABNORMAL HIGH (ref 0.1–1.0)
Monocytes Relative: 8 %
NEUTROS ABS: 9.9 10*3/uL — AB (ref 1.7–7.7)
NEUTROS PCT: 67 %
Platelets: 458 10*3/uL — ABNORMAL HIGH (ref 150–400)
RBC: 5.21 MIL/uL — AB (ref 3.87–5.11)
RDW: 17.9 % — AB (ref 11.5–15.5)
WBC: 14.7 10*3/uL — AB (ref 4.0–10.5)

## 2017-05-23 LAB — URINALYSIS, ROUTINE W REFLEX MICROSCOPIC
BILIRUBIN URINE: NEGATIVE
GLUCOSE, UA: NEGATIVE mg/dL
Ketones, ur: 5 mg/dL — AB
NITRITE: NEGATIVE
Specific Gravity, Urine: 1.035 — ABNORMAL HIGH (ref 1.005–1.030)
pH: 6 (ref 5.0–8.0)

## 2017-05-23 LAB — I-STAT BETA HCG BLOOD, ED (MC, WL, AP ONLY): I-stat hCG, quantitative: <5 m[IU]/mL (ref <5)

## 2017-05-23 MED ORDER — ONDANSETRON HCL 4 MG/2ML IJ SOLN
4.0000 mg | Freq: Once | INTRAMUSCULAR | Status: AC
  Administered 2017-05-23: 4 mg via INTRAVENOUS
  Filled 2017-05-23: qty 2

## 2017-05-23 MED ORDER — HYDROMORPHONE HCL 1 MG/ML IJ SOLN
1.0000 mg | Freq: Once | INTRAMUSCULAR | Status: AC
  Administered 2017-05-23: 1 mg via INTRAVENOUS
  Filled 2017-05-23: qty 1

## 2017-05-23 MED ORDER — ONDANSETRON HCL 4 MG PO TABS: 4.0000 mg | ORAL_TABLET | Freq: Three times a day (TID) | ORAL | 0 refills | Status: DC | PRN

## 2017-05-23 MED ORDER — KETOROLAC TROMETHAMINE 30 MG/ML IJ SOLN
30.0000 mg | Freq: Once | INTRAMUSCULAR | Status: AC
  Administered 2017-05-23: 30 mg via INTRAVENOUS
  Filled 2017-05-23: qty 1

## 2017-05-23 MED ORDER — TAMSULOSIN HCL 0.4 MG PO CAPS: ORAL_CAPSULE | ORAL | 0 refills | Status: DC

## 2017-05-23 MED ORDER — OXYCODONE-ACETAMINOPHEN 5-325 MG PO TABS: 1.0000 | ORAL_TABLET | Freq: Four times a day (QID) | ORAL | 0 refills | Status: DC | PRN

## 2017-05-23 MED ORDER — ONDANSETRON 8 MG PO TBDP
8.0000 mg | ORAL_TABLET | Freq: Once | ORAL | Status: AC
  Administered 2017-05-23: 8 mg via ORAL
  Filled 2017-05-23: qty 1

## 2017-05-23 MED ORDER — CEPHALEXIN 500 MG PO CAPS
500.0000 mg | ORAL_CAPSULE | Freq: Once | ORAL | Status: AC
  Administered 2017-05-23: 500 mg via ORAL
  Filled 2017-05-23: qty 1

## 2017-05-23 MED ORDER — CEPHALEXIN 500 MG PO CAPS: 500.0000 mg | ORAL_CAPSULE | Freq: Three times a day (TID) | ORAL | 0 refills | Status: DC

## 2017-05-23 NOTE — ED Triage Notes (Signed)
Pt c/o left flank pain that started this am.  

## 2017-05-23 NOTE — Discharge Instructions (Signed)
Drink plenty of fluids. Take the medications as prescribed. Return to the ED if you get fever or have uncontrolled vomiting or pain. Call Dr Dimas MillinMcKenzie's office, he's the Urologist on call to get a follow up appointment. You have a 2 mm UPJ stone on the left.

## 2017-05-23 NOTE — ED Provider Notes (Signed)
[facility] EMERGENCY DEPARTMENT Provider Note   CSN: [id number] Arrival date & time: 05/23/17  0419  Time seen 04:45 AM   History   Chief Complaint Chief Complaint  Patient presents with  . Flank Pain    HPI Sonya Olson is a 37 y.o. female.  HPI patient states she has a history of kidney stones and has had about 4 in the past.  She states the last one however was complicated and was large and stuck between the kidney and the bladder and she had to have lithotripsy.  This was in 2008.  She states this evening around 10 PM she was having some pain in the left lower quadrant however she was able to fall asleep.  She states she awakened at 2 AM with pain now in her left lower quadrant and in her flank.  Nothing she does makes the pain worse, nothing she does makes it feel better.  Patient has had nausea and vomited once.  She had one loose stool.  She denies dysuria, frequency, or hematuria.  She states this feels like when she has had stones in the past.  PCP Jeanice Lim, Velna Hatchet, MD   Past Medical History:  Diagnosis Date  . Allergy   . Asthma   . Eczema   . Hydradenitis   . Kidney stones 2008   last episode  . PCOS (polycystic ovarian syndrome)     Patient Active Problem List   Diagnosis Date Noted  . Mild persistent asthma 11/12/2015  . Atopic dermatitis 11/12/2015  . Perennial and seasonal allergic rhinitis 01/06/2015  . Hydradenitis 01/06/2015  . OSA (obstructive sleep apnea) 01/06/2015  . Family history of premature coronary artery disease 01/06/2015  . Severe obesity (BMI >= 40) (HCC) 01/06/2015  . PCOS (polycystic ovarian syndrome) 08/28/2011  . BMI greater than 30 08/28/2011    Past Surgical History:  Procedure Laterality Date  . APPENDECTOMY    . LITHOTRIPSY  2008  . NECK SURGERY      OB History    Gravida Para Term Preterm AB Living   1 1 1     1    SAB TAB Ectopic Multiple Live Births                   Home Medications    Prior to Admission  medications   Medication Sig Start Date End Date Taking? Authorizing Provider  desonide (DESOWEN) 0.05 % cream Apply 1 application topically daily as needed. 10/20/14  Yes [provider]  fluocinonide cream (LIDEX) 0.05 % Apply 1 application topically daily.   Yes [provider]  inFLIXimab in sodium chloride 0.9 % Inject 5 mg/kg into the vein every 8 (eight) weeks.   Yes [provider]  levocetirizine (XYZAL) 5 MG tablet Take 5 mg by mouth every evening.   Yes [provider]  levonorgestrel-ethinyl estradiol (AVIANE,ALESSE,LESSINA) 0.1-20 MG-MCG tablet Take 1 tablet by mouth.   Yes [provider]  Liraglutide -Weight Management (SAXENDA) 18 MG/3ML SOPN Inject 0.6 mg into the skin daily. Increase to max of 3mg  daily 05/15/17  Yes Turner, Velna Hatchet, MD  loratadine (CLARITIN) 10 MG tablet Take 10 mg by mouth daily.   Yes [provider]  metFORMIN (GLUCOPHAGE) 500 MG tablet Take 500 mg by mouth 2 (two) times daily with a meal.   Yes [provider]  montelukast (SINGULAIR) 10 MG tablet Take 10 mg by mouth at bedtime.   Yes [provider]  omeprazole (PRILOSEC) 20 MG capsule Take 1 capsule (20 mg total) by mouth 2 (two) times daily before a meal. 05/09/17  Yes Gray, Velna HatchetKawanta F, MD  cephALEXin (KEFLEX) 500 MG capsule Take 1 capsule (500 mg total) by mouth 3 (three) times daily. 05/23/17   Devoria AlbeKnapp, Mickie Kozikowski, MD  ciprofloxacin (CILOXAN) 0.3 % ophthalmic solution Administer 1 drop, 5 TIMES A DAY  , for the next 5 days. 05/09/17   , Velna HatchetKawanta F, MD  ondansetron (ZOFRAN) 4 MG tablet Take 1 tablet (4 mg total) by mouth every 8 (eight) hours as needed for nausea or vomiting. 05/23/17   Devoria AlbeKnapp, Peyson Delao, MD  oxyCODONE-acetaminophen (PERCOCET/ROXICET) 5-325 MG tablet Take 1 tablet by mouth every 6 (six) hours as needed for severe pain. 05/23/17   Devoria AlbeKnapp, Canary Fister, MD  tamsulosin (FLOMAX) 0.4 MG CAPS capsule Take 1 po QD until you pass the stone. 05/23/17    Devoria AlbeKnapp, Mitzi Lilja, MD    Family History Family History  Problem Relation Age of Onset  . Hypertension Mother   . Diabetes Mother   . Heart disease Mother 2852       CABG @ 5052  . Thyroid disease Sister   . Kidney disease Sister        2 kidney transplants  . Cancer Sister        OVARIAN  . Alcohol abuse Father   . Asthma Daughter   . Hypertension Maternal Uncle   . Diabetes Maternal Uncle   . Heart disease Maternal Uncle 51       CABG @ 51  . Stroke Maternal Grandmother   . Hypertension Maternal Grandfather   . Heart disease Maternal Grandfather [id number]       Died of MI @ 52--No Dxed CAD prior  . Mental illness Maternal Uncle     Social History Social History   Tobacco Use  . Smoking status: Never Smoker  . Smokeless tobacco: Never Used  Substance Use Topics  . Alcohol use: No  . Drug use: No  employed   Allergies   Bee pollen; Other; and Strawberry extract   Review of Systems Review of Systems  All other systems reviewed and are negative.    Physical Exam Updated Vital Signs BP (!) 138/98 (BP Location: Left Arm)   Pulse (!) 110   Temp 98.1 F (36.7 C) (Oral)   Resp 17   Ht 5\' 6"  (1.676 m)   Wt (!) 140.6 kg (310 lb)   SpO2 99%   BMI 50.04 kg/m   Vital signs normal except for tachycardia   Physical Exam  Constitutional: She is oriented to person, place, and time. She appears well-developed and well-nourished.  Non-toxic appearance. She does not appear ill. She appears distressed.  HENT:  Head: Normocephalic and atraumatic.  Right Ear: External ear normal.  Left Ear: External ear normal.  Nose: Nose normal. No mucosal edema or rhinorrhea.  Mouth/Throat: Mucous membranes are normal. No dental abscesses or uvula swelling.  Eyes: Conjunctivae and EOM are normal.  Neck: Normal range of motion and full passive range of motion without pain. Neck supple.  Cardiovascular: Regular rhythm and normal heart sounds. Tachycardia present. Exam reveals no gallop and no  friction rub.  No murmur heard. Pulmonary/Chest: Effort normal and breath sounds normal. No respiratory distress. She has no wheezes. She has no rhonchi. She has no rales. She exhibits no tenderness and no crepitus.  Abdominal: Soft. Normal appearance and bowel sounds are normal. She exhibits no distension. There is no  tenderness. There is no rebound and no guarding.    Area of pain noted, nontender  Genitourinary:  Genitourinary Comments: Patient describes left flank pain but she does not have pain to percussion.  Musculoskeletal: Normal range of motion. She exhibits no edema or tenderness.  Moves all extremities well.   Neurological: She is alert and oriented to person, place, and time. She has normal strength. No cranial nerve deficit.  Skin: Skin is warm, dry and intact. No rash noted. No erythema. No pallor.  Psychiatric: She has a normal mood and affect. Her speech is normal and behavior is normal. Her mood appears not anxious.  Nursing note and vitals reviewed.    ED Treatments / Results  Labs (all labs ordered are listed, but only abnormal results are displayed) Results for orders placed or performed during the hospital encounter of 05/23/17  Basic metabolic panel  Result Value Ref Range   Sodium 139 135 - 145 mmol/L   Potassium 4.0 3.5 - 5.1 mmol/L   Chloride 101 101 - 111 mmol/L   CO2 25 22 - 32 mmol/L   Glucose, Bld 125 (H) 65 - 99 mg/dL   BUN 13 6 - 20 mg/dL   Creatinine, Ser 6.96 0.44 - 1.00 mg/dL   Calcium 9.3 8.9 - 29.5 mg/dL   GFR calc non Af Amer >60 >60 mL/min   GFR calc Af Amer >60 >60 mL/min   Anion gap 13 5 - 15  CBC with Differential  Result Value Ref Range   WBC 14.7 (H) 4.0 - 10.5 K/uL   RBC 5.21 (H) 3.87 - 5.11 MIL/uL   Hemoglobin 11.7 (L) 12.0 - 15.0 g/dL   HCT 28.4 13.2 - 44.0 %   MCV 69.1 (L) 78.0 - 100.0 fL   MCH 22.5 (L) 26.0 - 34.0 pg   MCHC 32.5 30.0 - 36.0 g/dL   RDW 10.2 (H) 72.5 - 36.6 %   Platelets 458 (H) 150 - 400 K/uL   Neutrophils  Relative % 67 %   Neutro Abs 9.9 (H) 1.7 - 7.7 K/uL   Lymphocytes Relative 23 %   Lymphs Abs 3.3 0.7 - 4.0 K/uL   Monocytes Relative 8 %   Monocytes Absolute 1.1 (H) 0.1 - 1.0 K/uL   Eosinophils Relative 2 %   Eosinophils Absolute 0.3 0.0 - 0.7 K/uL   Basophils Relative 0 %   Basophils Absolute 0.0 0.0 - 0.1 K/uL  Urinalysis, Routine w reflex microscopic  Result Value Ref Range   Color, Urine AMBER (A) YELLOW   APPearance CLOUDY (A) CLEAR   Specific Gravity, Urine 1.035 (H) 1.005 - 1.030   pH 6.0 5.0 - 8.0   Glucose, UA NEGATIVE NEGATIVE mg/dL   Hgb urine dipstick SMALL (A) NEGATIVE   Bilirubin Urine NEGATIVE NEGATIVE   Ketones, ur 5 (A) NEGATIVE mg/dL   Protein, ur >=440 (A) NEGATIVE mg/dL   Nitrite NEGATIVE NEGATIVE   Leukocytes, UA SMALL (A) NEGATIVE   RBC / HPF TOO NUMEROUS TO COUNT 0 - 5 RBC/hpf   WBC, UA TOO NUMEROUS TO COUNT 0 - 5 WBC/hpf   Bacteria, UA FEW (A) NONE SEEN   Squamous Epithelial / LPF 6-30 (A) NONE SEEN   Mucus PRESENT    Uric Acid Crys, UA PRESENT   I-Stat beta hCG blood, ED  Result Value Ref Range   I-stat hCG, quantitative <5.0 <5 mIU/mL   Comment 3           Laboratory interpretation all normal  except leukocytosis    EKG  EKG Interpretation None       Radiology Ct Renal Stone Study  Result Date: 05/23/2017 CLINICAL DATA:  Left flank pain starting this morning. EXAM: CT ABDOMEN AND PELVIS WITHOUT CONTRAST TECHNIQUE: Multidetector CT imaging of the abdomen and pelvis was performed following the standard protocol without IV contrast. COMPARISON:  04/11/2009 FINDINGS: Lower chest: The lung bases are clear. Hepatobiliary: No focal liver abnormality is seen. No gallstones, gallbladder wall thickening, or biliary dilatation. Pancreas: Unremarkable. No pancreatic ductal dilatation or surrounding inflammatory changes. Spleen: Normal in size without focal abnormality. Adrenals/Urinary Tract: No adrenal gland nodules. 2 mm stone in the proximal left  ureter at the ureteropelvic junction. No significant proximal obstruction. Right kidney and ureter as well as the bladder are unremarkable. Stomach/Bowel: Stomach, small bowel, stomach, small bowel, and colon are mostly decompressed. No inflammatory infiltration. Appendix is not identified and may be surgically absent. Vascular/Lymphatic: No significant vascular findings are present. No enlarged abdominal or pelvic lymph nodes. Reproductive: Uterus and bilateral adnexa are unremarkable. Other: No abdominal wall hernia or abnormality. No abdominopelvic ascites. Musculoskeletal: No acute or significant osseous findings. IMPRESSION: 1. 2 mm stone in the proximal left ureter without significant proximal obstructive changes. Electronically Signed   By: Burman Nieves M.D.   On: 05/23/2017 06:00    Procedures Procedures (including critical care time)  Medications Ordered in ED Medications  cephALEXin (KEFLEX) capsule 500 mg (not administered)  ondansetron (ZOFRAN) injection 4 mg (4 mg Intravenous Given 05/23/17 0520)  ketorolac (TORADOL) 30 MG/ML injection 30 mg (30 mg Intravenous Given 05/23/17 0520)  ondansetron (ZOFRAN-ODT) disintegrating tablet 8 mg (8 mg Oral Given 05/23/17 0516)  HYDROmorphone (DILAUDID) injection 1 mg (1 mg Intravenous Given 05/23/17 0647)     Initial Impression / Assessment and Plan / ED Course  I have reviewed the triage vital signs and the nursing notes.  Pertinent labs & imaging results that were available during my care of the patient were reviewed by me and considered in my medical decision making (see chart for details).     6:20 AM we discussed her CT results.  Although her stone is small it is stuck up high.  Hopefully she would be able to pass it.  She states she does need something else for pain before she leaves and she has someone to drive her home.  Patient is getting me a urine sample so I can decide if she needs to be on any antibiotics.  We discussed follow-up  with urology and reasons to return to the ED such as fever, uncontrollable vomiting or severe pain.  Review of the [place] shows patient got tramadol 20 tablets on February 21, 2017 and #30 hydrocodone 5/325 on July 07, 2016.  Final Clinical Impressions(s) / ED Diagnoses   Final diagnoses:  Left ureteral stone  Acute cystitis with hematuria    ED Discharge Orders        Ordered    cephALEXin (KEFLEX) 500 MG capsule  3 times daily     05/23/17 0658    oxyCODONE-acetaminophen (PERCOCET/ROXICET) 5-325 MG tablet  Every 6 hours PRN     05/23/17 0658    ondansetron (ZOFRAN) 4 MG tablet  Every 8 hours PRN     05/23/17 0658    tamsulosin (FLOMAX) 0.4 MG CAPS capsule     05/23/17 0658      Plan discharge  Devoria Albe, MD, Concha Pyo, MD 05/23/17 [phone]

## 2017-05-25 LAB — URINE CULTURE: Special Requests: NORMAL

## 2017-05-30 ENCOUNTER — Encounter: Payer: Self-pay | Admitting: *Deleted

## 2017-06-20 ENCOUNTER — Other Ambulatory Visit: Payer: Self-pay

## 2017-06-20 ENCOUNTER — Ambulatory Visit: Payer: BC Managed Care – PPO | Admitting: Family Medicine

## 2017-06-20 ENCOUNTER — Encounter: Payer: Self-pay | Admitting: Family Medicine

## 2017-06-20 DIAGNOSIS — F4321 Adjustment disorder with depressed mood: Secondary | ICD-10-CM

## 2017-06-20 DIAGNOSIS — F418 Other specified anxiety disorders: Secondary | ICD-10-CM | POA: Insufficient documentation

## 2017-06-20 MED ORDER — ESCITALOPRAM OXALATE 5 MG PO TABS: 5.0000 mg | ORAL_TABLET | Freq: Every day | ORAL | 2 refills | Status: DC

## 2017-06-20 MED ORDER — LIRAGLUTIDE -WEIGHT MANAGEMENT 18 MG/3ML ~~LOC~~ SOPN: 0.6000 mg | PEN_INJECTOR | Freq: Every day | SUBCUTANEOUS | 2 refills | Status: DC

## 2017-06-20 NOTE — Assessment & Plan Note (Signed)
Situational depression along with some anxiety and insomnia.  Significant medical history especially with her hidradenitis which is very severe.  She is still awaiting a Engineer, petroleumplastic surgeon to try to have some operations done on her arms which give her the most trouble.  We will start her on Lexapro 5 mg at bedtime she is going to follow-up in May via my chart or telephone in a few weeks as she has a couple [facility] the [facility]

## 2017-06-20 NOTE — Assessment & Plan Note (Signed)
Weight down 7 pounds in the past 5 weeks.  She was off for a week secondary to the kidney stone.  We will have her continue with the current dose she is tolerating well.  Goal is at least 15 pounds off by her next visit in May.  Continue to work on decreasing her sweets and carbs.

## 2017-06-20 NOTE — Patient Instructions (Signed)
Call or email in about 3-4 weeks to see how you are doing  F/U 2nd week in May for weight

## 2017-06-20 NOTE — Progress Notes (Signed)
Subjective:    Patient ID: Sonya Olson, female    DOB: 01-13-1981, 37 y.o.   MRN: [id number]  Patient presents for Follow-up (weight check) Pt here to f/u obesity she is currently on Saxenda , she has underlying PCOS as well. Weight Jan 9th was 309lbs   She stopped medication for 1 week, when she was  Dx with left kidney stone in at end of January, [facility] as [facility].  Up to 3mg  on Saxenda with dinner , mild nausea Drink smoothie for breakfast, packing lunch instead of going out, has decreased eating out for dinner    Cut out some dairy to help with inflammation and her autoimmune flares Cut back on fruit punch juices, increasing water  Agitated after work, not sleeping well, cant turn her brain off, feels so tired , she does feel depressed at times, when she thinks about her overall health especially the severity of her hidradenitis that she is very self-conscious about this.  Other times she just feels on edge.  She does find herself yelling at family members and then apologizing.  Meeting with plastic surgeon on 27th of march     Review Of Systems:  GEN- denies fatigue, fever, weight loss,weakness, recent illness HEENT- denies eye drainage, change in vision, nasal discharge, CVS- denies chest pain, palpitations RESP- denies SOB, cough, wheeze ABD- denies N/V, change in stools, abd pain GU- denies dysuria, hematuria, dribbling, incontinence MSK- denies joint pain, muscle aches, injury Neuro- denies headache, dizziness, syncope, seizure activity       Objective:    BP 122/72   Pulse 92   Temp 98.9 F (37.2 C) (Oral)   Resp 14   Ht 5\' 6"  (1.676 m)   Wt (!) 302 lb (137 kg)   SpO2 99%   BMI 48.74 kg/m  GEN- NAD, alert and oriented x3 HEENT- PERRL, EOMI, non injected sclera, pink conjunctiva, MMM, oropharynx clear Neck- Supple, open HS on back of neck with clear drainage (chronic) CVS- RRR, no murmur RESP-CTAB Psych- normal affect and mood  EXT-  pedal edema Pulses- Radial  2+        Assessment & Plan:      Problem List Items Addressed This Visit      Unprioritized   Situational depression    Situational depression along with some anxiety and insomnia.  Significant medical history especially with her hidradenitis which is very severe.  She is still awaiting a Engineer, petroleumplastic surgeon to try to have some operations done on her arms which give her the most trouble.  We will start her on Lexapro 5 mg at bedtime she is going to follow-up in May via my chart or telephone in a few weeks as she has a couple other appointments in the next [facility] weeks [facility].      Relevant Medications   escitalopram (LEXAPRO) 5 MG tablet   Severe obesity (BMI >= 40) (HCC) - Primary    Weight down 7 pounds in the past 5 weeks.  She was off for a week secondary to the kidney stone.  We will have her continue with the current dose she is tolerating well.  Goal is at least 15 pounds off by her next visit in May.  Continue to work on decreasing her sweets and carbs.      Relevant Medications   metFORMIN (GLUMETZA) 500 MG (MOD) 24 hr tablet   Liraglutide -Weight Management (SAXENDA) 18 MG/3ML SOPN  Note: This dictation was prepared with Dragon dictation along with smaller phrase technology. Any transcriptional errors that result from this process are unintentional.

## 2017-06-27 ENCOUNTER — Encounter: Payer: Self-pay | Admitting: Physician Assistant

## 2017-06-27 ENCOUNTER — Other Ambulatory Visit: Payer: Self-pay

## 2017-06-27 ENCOUNTER — Ambulatory Visit: Payer: BC Managed Care – PPO | Admitting: Physician Assistant

## 2017-06-27 VITALS — BP 132/84 | HR 115 | Temp 98.7°F | Resp 14 | Wt 297.8 lb

## 2017-06-27 DIAGNOSIS — J988 Other specified respiratory disorders: Secondary | ICD-10-CM | POA: Diagnosis not present

## 2017-06-27 DIAGNOSIS — R52 Pain, unspecified: Secondary | ICD-10-CM | POA: Diagnosis not present

## 2017-06-27 DIAGNOSIS — B9789 Other viral agents as the cause of diseases classified elsewhere: Secondary | ICD-10-CM | POA: Diagnosis not present

## 2017-06-27 LAB — INFLUENZA A AND B AG, IMMUNOASSAY
INFLUENZA A ANTIGEN: NOT DETECTED
INFLUENZA B ANTIGEN: NOT DETECTED

## 2017-06-27 NOTE — Progress Notes (Signed)
Patient ID: Sonya PointerMonique N Touchton MRN: [id number], DOB: 1980-10-16, 37 y.o. Date of Encounter: 06/27/2017, 10:27 AM    Chief Complaint:  Chief Complaint  Patient presents with  . Generalized Body Aches  . chest congestion  . Cough     HPI: 37 y.o. year old female presents with above.   States that symptoms just started yesterday.  Today developed some chest congestion and cough.  Also was achy all over.  Says that she had a low-grade fever yesterday but says that that happens sometimes for her in general because she has hidradenitis.  Has had minimal nasal drainage.  No sore throat.  Says she is just extra cautious because she is on Remicade so just wanted to come in and get evaluated and also to make sure she did not have the flu. 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  Has missed no work until today.     Home Meds:   Outpatient Medications Prior to Visit  Medication Sig Dispense Refill  . desonide (DESOWEN) 0.05 % cream Apply 1 application topically daily as needed.    Marland Kitchen. escitalopram (LEXAPRO) 5 MG tablet Take 1 tablet (5 mg total) by mouth at bedtime. 30 tablet 2  . fluocinonide cream (LIDEX) 0.05 % Apply 1 application topically daily.    Marland Kitchen. inFLIXimab in sodium chloride 0.9 % Inject 5 mg/kg into the vein every 8 (eight) weeks.    Marland Kitchen. levocetirizine (XYZAL) 5 MG tablet Take 5 mg by mouth every evening.    Marland Kitchen. levonorgestrel-ethinyl estradiol (AVIANE,ALESSE,LESSINA) 0.1-20 MG-MCG tablet Take 1 tablet by mouth.    . Liraglutide -Weight Management (SAXENDA) 18 MG/3ML SOPN Inject 0.6 mg into the skin daily. Increase to max of 3mg  daily 5 pen 2  . loratadine (CLARITIN) 10 MG tablet Take 10 mg by mouth daily.    . metFORMIN (GLUMETZA) 500 MG (MOD) 24 hr tablet Take 500 mg by mouth 2 (two) times daily with a meal.    . montelukast (SINGULAIR) 10 MG tablet Take 10 mg by mouth at bedtime.    Marland Kitchen. omeprazole (PRILOSEC) 20 MG capsule Take 1 capsule (20 mg total) by mouth 2 (two) times daily before a meal. 60  capsule 6   No facility-administered medications prior to visit.     Allergies:  Allergies  Allergen Reactions  . Bee Pollen   . Other     Grass, trees, pet dander, dust    . Strawberry Extract       Review of Systems: See HPI for pertinent ROS. All other ROS negative.    Physical Exam: Blood pressure 132/84, pulse (!) 115, temperature 98.7 F (37.1 C), temperature source Oral, resp. rate 14, weight 135.1 kg (297 lb 12.8 oz), SpO2 98 %., Body mass index is 48.07 kg/m. General:  Obese AAF. Appears in no acute distress. HEENT: Normocephalic, atraumatic, eyes without discharge, sclera non-icteric, nares are without discharge. Bilateral auditory canals clear, TM's are without perforation, pearly grey and translucent with reflective cone of light bilaterally. Oral cavity moist, posterior pharynx without exudate, erythema, peritonsillar abscess.  No tenderness with percussion to frontal or maxillary sinuses bilaterally.  Neck: Supple. No thyromegaly. No lymphadenopathy. Lungs: Clear bilaterally to auscultation without wheezes, rales, or rhonchi. Breathing is unlabored. Heart: Regular rhythm. No murmurs, rubs, or gallops. Msk:  Strength and tone normal for age. Extremities/Skin: Warm and dry.  Neuro: Alert and oriented X 3. Moves all extremities spontaneously. Gait is normal. CNII-XII grossly in tact. Psych:  Responds to  questions appropriately with a normal affect.     ASSESSMENT AND PLAN:  37 y.o. year old female with  1. Viral respiratory infection Discussed that flu test is negative. Discussed that at this time consistent with a viral illness.  Recommend over-the-counter medications as needed for symptom relief.  Follow up if symptoms worsen significantly or develops high fever or if symptoms persist greater than 7-10 days. Offered Note for work but she defers any note. 2. Generalized body aches - Influenza A and B Ag, Immunoassay   Signed, [address] [place], [place],  [facility] 06/27/2017 10:27 AM

## 2017-07-11 ENCOUNTER — Encounter: Payer: Self-pay | Admitting: Family Medicine

## 2017-08-24 ENCOUNTER — Other Ambulatory Visit: Payer: Self-pay | Admitting: Family Medicine

## 2017-09-12 ENCOUNTER — Telehealth: Payer: Self-pay | Admitting: *Deleted

## 2017-09-12 MED ORDER — LIRAGLUTIDE -WEIGHT MANAGEMENT 18 MG/3ML ~~LOC~~ SOPN: 0.6000 mg | PEN_INJECTOR | Freq: Every day | SUBCUTANEOUS | 2 refills | Status: DC

## 2017-09-12 NOTE — Telephone Encounter (Signed)
Received PA determination.   PA approved 09/12/2017 - 09/13/2018.   Pharmacy made aware.

## 2017-09-12 NOTE — Telephone Encounter (Signed)
Your information has been submitted to [employer]. To check for an updated outcome later, reopen this PA request from your dashboard. If [employer] has not responded to your request within 24 hours, contact [employer] at [phone]. If you think there may be a problem with your PA request, use our live chat feature at the bottom right.    

## 2017-09-12 NOTE — Telephone Encounter (Signed)
Received request from pharmacy for PA on Saxenda.  PA submitted.   Dx: E66.09- Obesity.   05/09/2017  06/27/2017 309lb   297lb 49.9%   48.07%

## 2017-09-14 ENCOUNTER — Ambulatory Visit (INDEPENDENT_AMBULATORY_CARE_PROVIDER_SITE_OTHER): Payer: BC Managed Care – PPO | Admitting: Family Medicine

## 2017-09-14 ENCOUNTER — Other Ambulatory Visit: Payer: Self-pay

## 2017-09-14 ENCOUNTER — Encounter: Payer: Self-pay | Admitting: Family Medicine

## 2017-09-14 ENCOUNTER — Ambulatory Visit: Payer: BC Managed Care – PPO | Admitting: Family Medicine

## 2017-09-14 DIAGNOSIS — E7439 Other disorders of intestinal carbohydrate absorption: Secondary | ICD-10-CM | POA: Diagnosis not present

## 2017-09-14 DIAGNOSIS — Z13220 Encounter for screening for lipoid disorders: Secondary | ICD-10-CM | POA: Diagnosis not present

## 2017-09-14 DIAGNOSIS — F4321 Adjustment disorder with depressed mood: Secondary | ICD-10-CM

## 2017-09-14 DIAGNOSIS — E282 Polycystic ovarian syndrome: Secondary | ICD-10-CM | POA: Diagnosis not present

## 2017-09-14 MED ORDER — OMEPRAZOLE 20 MG PO CPDR: 20.0000 mg | DELAYED_RELEASE_CAPSULE | Freq: Two times a day (BID) | ORAL | 6 refills | Status: DC

## 2017-09-14 MED ORDER — ESCITALOPRAM OXALATE 10 MG PO TABS: 10.0000 mg | ORAL_TABLET | Freq: Every day | ORAL | 2 refills | Status: DC

## 2017-09-14 NOTE — Progress Notes (Signed)
   Subjective:    Patient ID: Sonya Olson, female    DOB: Dec 15, 1980, 37 y.o.   MRN: [id number]  Patient presents for Follow-up (is fasting) Patient here to follow-up.  At her last visit in February she was started on Lexapro 5 mg to help with stressors in the setting of all her chronic medical problems especially her severe hidradenitis causing recurrent infections all over her skin. Still stressors, feels it helps take the edge off, but wears off some days, wants to try  Higher dose. Family can tell if she has not taken meds Sleeping okay    Morbid obesity she is currently on Saxenda and Metformin her weight was 302 pounds in February, down 9lbs Has been out of medication for the past week due to pre-autherization  Surgery in August 19th for right axilla/ arm hydradenitis  GYN next Friday    Review Of Systems:  GEN- denies fatigue, fever, weight loss,weakness, recent illness HEENT- denies eye drainage, change in vision, nasal discharge, CVS- denies chest pain, palpitations RESP- denies SOB, cough, wheeze ABD- denies N/V, change in stools, abd pain GU- denies dysuria, hematuria, dribbling, incontinence MSK- denies joint pain, muscle aches, injury Neuro- denies headache, dizziness, syncope, seizure activity       Objective:    BP 130/68   Pulse 90   Temp 97.9 F (36.6 C) (Oral)   Resp 14   Ht  (1.676 m)   Wt 293 lb (132.9 kg)   SpO2 97%   BMI 47.29 kg/m  GEN- NAD, alert and oriented x3 CVS- RRR, no murmur RESP-CTAB Psych- normal affect and mood EXT- chronic ankle edema Pulses- Radial  2+        Assessment & Plan:      Problem List Items Addressed This Visit      Unprioritized   Situational depression    Increase lexapro to  daily      Relevant Medications   escitalopram (LEXAPRO) 10 MG tablet   Severe obesity (BMI >= 40) (HCC) - Primary    Restart saxenda, overall doing well  Has passed first goal of < 300lbs GOAL 10-15 lbs by next  visit in 3 months       Relevant Orders   CBC with Differential/Platelet   Comprehensive metabolic panel   Hemoglobin A1c   Lipid panel   PCOS (polycystic ovarian syndrome)    Check A1C Continue MTF Working on weight loss      Relevant Orders   CBC with Differential/Platelet   Comprehensive metabolic panel    Other Visit Diagnoses    Glucose intolerance       Relevant Orders   CBC with Differential/Platelet   Comprehensive metabolic panel   Hemoglobin A1c   Screening cholesterol level       Relevant Orders   Lipid panel      Note: This dictation was prepared with Dragon dictation along with smaller phrase technology. Any transcriptional errors that result from this process are unintentional.

## 2017-09-14 NOTE — Patient Instructions (Addendum)
Continue Saxenda Lexapro increased to   Release of records [facility]- Dr. Pennie Rushing F/U 4 months Frazier Richards

## 2017-09-16 NOTE — Assessment & Plan Note (Signed)
Restart saxenda, overall doing well  Has passed first goal of < 300lbs GOAL 10-15 lbs by next visit in 3 months

## 2017-09-16 NOTE — Assessment & Plan Note (Signed)
Increase lexapro to 10 mg daily.

## 2017-09-16 NOTE — Assessment & Plan Note (Signed)
Check A1C Continue MTF Working on weight loss

## 2017-09-20 ENCOUNTER — Other Ambulatory Visit: Payer: BC Managed Care – PPO

## 2017-09-20 NOTE — Progress Notes (Unsigned)
L 

## 2017-09-21 ENCOUNTER — Encounter: Payer: Self-pay | Admitting: *Deleted

## 2017-09-21 LAB — CBC WITH DIFFERENTIAL/PLATELET
BASOS PCT: 0.3 %
Basophils Absolute: 35 cells/uL (ref 0–200)
EOS PCT: 1 %
Eosinophils Absolute: 115 cells/uL (ref 15–500)
HEMATOCRIT: 31.7 % — AB (ref 35.0–45.0)
HEMOGLOBIN: 10.3 g/dL — AB (ref 11.7–15.5)
Lymphs Abs: 2542 cells/uL (ref 850–3900)
MCH: 22.5 pg — ABNORMAL LOW (ref 27.0–33.0)
MCHC: 32.5 g/dL (ref 32.0–36.0)
MCV: 69.4 fL — AB (ref 80.0–100.0)
MONOS PCT: 6.4 %
MPV: 9.9 fL (ref 7.5–12.5)
NEUTROS ABS: 8073 {cells}/uL — AB (ref 1500–7800)
Neutrophils Relative %: 70.2 %
Platelets: 381 10*3/uL (ref 140–400)
RBC: 4.57 10*6/uL (ref 3.80–5.10)
RDW: 17.6 % — ABNORMAL HIGH (ref 11.0–15.0)
TOTAL LYMPHOCYTE: 22.1 %
WBC mixed population: 736 cells/uL (ref 200–950)
WBC: 11.5 10*3/uL — ABNORMAL HIGH (ref 3.8–10.8)

## 2017-09-21 LAB — COMPREHENSIVE METABOLIC PANEL
AG RATIO: 0.8 (calc) — AB (ref 1.0–2.5)
ALKALINE PHOSPHATASE (APISO): 66 U/L (ref 33–115)
ALT: 9 U/L (ref 6–29)
AST: 9 U/L — ABNORMAL LOW (ref 10–30)
Albumin: 3.5 g/dL — ABNORMAL LOW (ref 3.6–5.1)
BILIRUBIN TOTAL: 0.3 mg/dL (ref 0.2–1.2)
BUN: 12 mg/dL (ref 7–25)
CALCIUM: 9 mg/dL (ref 8.6–10.2)
CO2: 26 mmol/L (ref 20–32)
Chloride: 105 mmol/L (ref 98–110)
Creat: 0.59 mg/dL (ref 0.50–1.10)
Globulin: 4.3 g/dL (calc) — ABNORMAL HIGH (ref 1.9–3.7)
Glucose, Bld: 75 mg/dL (ref 65–99)
POTASSIUM: 4.4 mmol/L (ref 3.5–5.3)
SODIUM: 140 mmol/L (ref 135–146)
TOTAL PROTEIN: 7.8 g/dL (ref 6.1–8.1)

## 2017-09-21 LAB — LIPID PANEL
CHOLESTEROL: 169 mg/dL (ref <200)
HDL: 49 mg/dL — ABNORMAL LOW (ref >50)
LDL Cholesterol (Calc): 102 mg/dL (calc) — ABNORMAL HIGH
Non-HDL Cholesterol (Calc): 120 mg/dL (calc) (ref <130)
TRIGLYCERIDES: 87 mg/dL (ref <150)
Total CHOL/HDL Ratio: 3.4 (calc) (ref <5.0)

## 2017-09-21 LAB — HEMOGLOBIN A1C
EAG (MMOL/L): 6.6 (calc)
Hgb A1c MFr Bld: 5.8 % of total Hgb — ABNORMAL HIGH (ref <5.7)
Mean Plasma Glucose: 120 (calc)

## 2017-09-24 ENCOUNTER — Encounter: Payer: Self-pay | Admitting: Family Medicine

## 2017-09-26 ENCOUNTER — Other Ambulatory Visit: Payer: Self-pay | Admitting: *Deleted

## 2017-09-26 MED ORDER — LEVOCETIRIZINE DIHYDROCHLORIDE 5 MG PO TABS: 5.0000 mg | ORAL_TABLET | Freq: Every evening | ORAL | 3 refills | Status: DC

## 2017-09-27 ENCOUNTER — Other Ambulatory Visit: Payer: Self-pay | Admitting: *Deleted

## 2017-09-27 MED ORDER — ESCITALOPRAM OXALATE 5 MG PO TABS: 7.5000 mg | ORAL_TABLET | Freq: Every day | ORAL | 3 refills | Status: DC

## 2017-09-27 MED ORDER — FERROUS SULFATE 325 (65 FE) MG PO TABS: 325.0000 mg | ORAL_TABLET | Freq: Every day | ORAL | 3 refills | Status: DC

## 2017-09-27 MED ORDER — METFORMIN HCL ER (MOD) 500 MG PO TB24: 500.0000 mg | ORAL_TABLET | Freq: Every day | ORAL | 0 refills | Status: DC

## 2017-10-11 ENCOUNTER — Ambulatory Visit: Payer: BC Managed Care – PPO | Admitting: Allergy & Immunology

## 2017-10-11 ENCOUNTER — Encounter: Payer: Self-pay | Admitting: Allergy & Immunology

## 2017-10-11 VITALS — BP 122/88 | HR 72 | Temp 98.6°F | Resp 20 | Ht 66.0 in | Wt 297.0 lb

## 2017-10-11 DIAGNOSIS — J302 Other seasonal allergic rhinitis: Secondary | ICD-10-CM | POA: Diagnosis not present

## 2017-10-11 DIAGNOSIS — J3089 Other allergic rhinitis: Secondary | ICD-10-CM

## 2017-10-11 DIAGNOSIS — J4530 Mild persistent asthma, uncomplicated: Secondary | ICD-10-CM

## 2017-10-11 MED ORDER — FLUTICASONE PROPIONATE 50 MCG/ACT NA SUSP: 1.0000 | Freq: Two times a day (BID) | NASAL | 5 refills | Status: DC | PRN

## 2017-10-11 MED ORDER — PREDNISONE 10 MG PO TABS: ORAL_TABLET | ORAL | 0 refills | Status: DC

## 2017-10-11 NOTE — Patient Instructions (Addendum)
1. Mild persistent asthma, uncomplicated - We could not do lung testing today since you were coughing so much. - Continue with montelukast 10mg  daily as needed.  - Continue with albuterol, but increase to four puffs every 4-6 hours for the next few days and then as needed thereafter.  - Feel free to use your daughter's nebulizer machine if needed.   2. Seasonal and perennial allergic rhinitis - with allergic reaction from unknown trigger - I am not sure what triggered this, but we will start you on a prednisone taper: Take 3 tabs (30mg ) twice daily for 3 days, then 2 tabs (20mg ) twice daily for 3 days, then 1 tab (10mg ) twice daily for 3 days, then STOP. - Increase Xyzal to 5mg  twice daily for one week to help with the itching. - Ok to continue with Benadryl 1-2 tablets nightly if needed.  - Continue with fluticasone one spray per nostril up to twice daily as needed.   - Call us Monday with an update.   3. Follow up in six months or earlier if needed.    Please inform us of any Emergency Department visits, hospitalizations, or changes in symptoms. Call us before going to the ED for breathing or allergy symptoms since we might be able to fit you in for a sick visit. Feel free to contact us anytime with any questions, problems, or concerns.  It was a pleasure to meet you today!  Websites that have reliable patient information: 1. American Academy of Asthma, Allergy, and Immunology: www.aaaai.org 2. Food Allergy Research and Education (FARE): foodallergy.org 3. Mothers of Asthmatics: http://www.asthmacommunitynetwork.org 4. American College of Allergy, Asthma, and Immunology: MissingWeapons.cawww.acaai.org   Make sure you are registered to vote!

## 2017-10-11 NOTE — Progress Notes (Signed)
FOLLOW UP  Date of Service/Encounter:  10/11/17   Assessment:   Mild persistent asthma, uncomplicated  Seasonal and perennial allergic rhinitis   Asthma Reportables:  Severity: mild persistent  Risk: high Control: not well controlled   Plan/Recommendations:   1. Mild persistent asthma, uncomplicated - We could not do lung testing today since you were coughing so much. - Continue with montelukast 10mg  daily as needed.  - Continue with albuterol, but increase to four puffs every 4-6 hours for the next few days and then as needed thereafter.  - Feel free to use your daughter's nebulizer machine if needed.   2. Seasonal and perennial allergic rhinitis - with allergic reaction from unknown trigger - I am not sure what triggered this, but we will start you on a prednisone taper: Take 3 tabs (30mg ) twice daily for 3 days, then 2 tabs (20mg ) twice daily for 3 days, then 1 tab (10mg ) twice daily for 3 days, then STOP. - Increase Xyzal to 5mg  twice daily for one week to help with the itching. - Ok to continue with Benadryl 1-2 tablets nightly if needed.  - Continue with fluticasone one spray per nostril up to twice daily as needed.   - Call us Monday with an update.  - There did not seem to be an indication for antibiotics at this time.   3. Follow up in six months or earlier if needed.     Subjective:   Sonya Olson is a 37 y.o. female presenting today for follow up of  Chief Complaint  Patient presents with  . Asthma    productive cough that began last night and has worsened since then. clear sputum.     Sonya PointerMonique N Olson has a history of the following: Patient Active Problem List   Diagnosis Date Noted  . Situational depression 06/20/2017  . Mild persistent asthma 11/12/2015  . Atopic dermatitis 11/12/2015  . Perennial and seasonal allergic rhinitis 01/06/2015  . Hydradenitis 01/06/2015  . OSA (obstructive sleep apnea) 01/06/2015  . Family history of premature  coronary artery disease 01/06/2015  . Severe obesity (BMI >= 40) (HCC) 01/06/2015  . PCOS (polycystic ovarian syndrome) 08/28/2011    History obtained from: chart review and patient.  Particia JasperMonique N Bubeck's Primary Care Provider is Poy SippiDurham, Velna HatchetKawanta F, MD.     Sonya RungMonique is a 37 y.o. female presenting for a sick visit. She has a history of asthma, allergic rhinitis, and GERD as well as atopic dermatitis. She was last seen in July 2017 by Dr. Nunzio CobbsBobbitt. At that time, she was not doing well with worsening allergic rhinitis symptoms. Allergen immunotherapy was discussed but she never went through with it. She was continued on her Singulair 10mg  daily as well as albuterol as needed. She was continued on Xyzal 5mg  daily as well as Singulair and fluticasone.   Since the last visit, she has mostly done well. 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 However, over the last week she has developed worsening postnasal drip as well as mucous congestion within her throat. She has increased her fluticasone to two sprays per nostril twice daily and has been using Xyzal as well as Claritin and Benadryl. She has had no sinus pressure or fever. She does not feel that her asthma is acting up, but she does feel that she needs something to break up the mucous in her throat. She is unsure of the trigger, as there are no sick  contacts in her social circle.   In conjunction with these symptoms, she feels that her skin has started acting up more. She describes these are hives, but in reality they appear to be more papular in nature. They are pruritis. She recently started a weight loss drug - Saxenda - but she has been on this drug for a period of time without problems. Stopping it temporarily did not seem to help at all.   She has a history of hidrenitis and sees Dr. Tammi Sou at [facility].   Otherwise, there have been no changes to her past medical history, surgical  history, family history, or social history.    Review of Systems: a 14-point review of systems is pertinent for what is mentioned in HPI.  Otherwise, all other systems were negative. Constitutional: negative other than that listed in the HPI Eyes: negative other than that listed in the HPI Ears, nose, mouth, throat, and face: negative other than that listed in the HPI Respiratory: negative other than that listed in the HPI Cardiovascular: negative other than that listed in the HPI Gastrointestinal: negative other than that listed in the HPI Genitourinary: negative other than that listed in the HPI Integument: negative other than that listed in the HPI Hematologic: negative other than that listed in the HPI Musculoskeletal: negative other than that listed in the HPI Neurological: negative other than that listed in the HPI Allergy/Immunologic: negative other than that listed in the HPI    Objective:   Blood pressure 122/88, pulse 72, temperature 98.6 F (37 C), temperature source Oral, resp. rate 20, height 5\' 6"  (1.676 m), weight 297 lb (134.7 kg), SpO2 98 %. Body mass index is 47.94 kg/m.   Physical Exam:  General: Alert, interactive, in no acute distress. Pleasant female.  Eyes: No conjunctival injection bilaterally, no discharge on the right, no discharge on the left and no Horner-Trantas dots present. PERRL bilaterally. EOMI without pain. No photophobia.  Ears: Right TM pearly gray with normal light reflex, Left TM pearly gray with normal light reflex, Right TM intact without perforation and Left TM intact without perforation.  Nose/Throat: External nose within normal limits and septum midline. Turbinates edematous and pale with clear discharge. Posterior oropharynx erythematous with cobblestoning in the posterior oropharynx. Tonsils 2+ without exudates.  Tongue without thrush. Lungs: Clear to auscultation without wheezing, rhonchi or rales. No increased work of  breathing. CV: Normal S1/S2. No murmurs. Capillary refill <2 seconds.  Skin: Warm and dry, without lesions or rashes. Neuro:   Grossly intact. No focal deficits appreciated. Responsive to questions.  Diagnostic studies: none (patient refused spirometry)      Malachi Bonds, MD  [facility]

## 2017-11-08 ENCOUNTER — Encounter: Payer: Self-pay | Admitting: Family Medicine

## 2017-11-09 MED ORDER — ESCITALOPRAM OXALATE 5 MG PO TABS: 7.5000 mg | ORAL_TABLET | Freq: Every day | ORAL | 3 refills | Status: DC

## 2018-01-16 ENCOUNTER — Encounter: Payer: Self-pay | Admitting: Physician Assistant

## 2018-01-16 ENCOUNTER — Ambulatory Visit: Payer: BC Managed Care – PPO | Admitting: Physician Assistant

## 2018-01-16 VITALS — BP 120/84 | HR 90 | Temp 98.5°F | Resp 18 | Ht 66.0 in | Wt 302.8 lb

## 2018-01-16 DIAGNOSIS — E282 Polycystic ovarian syndrome: Secondary | ICD-10-CM | POA: Diagnosis not present

## 2018-01-16 DIAGNOSIS — K219 Gastro-esophageal reflux disease without esophagitis: Secondary | ICD-10-CM | POA: Insufficient documentation

## 2018-01-16 DIAGNOSIS — G4733 Obstructive sleep apnea (adult) (pediatric): Secondary | ICD-10-CM

## 2018-01-16 DIAGNOSIS — F4321 Adjustment disorder with depressed mood: Secondary | ICD-10-CM

## 2018-01-16 DIAGNOSIS — L732 Hidradenitis suppurativa: Secondary | ICD-10-CM | POA: Diagnosis not present

## 2018-01-16 DIAGNOSIS — Z8249 Family history of ischemic heart disease and other diseases of the circulatory system: Secondary | ICD-10-CM

## 2018-01-16 MED ORDER — LIRAGLUTIDE -WEIGHT MANAGEMENT 18 MG/3ML ~~LOC~~ SOPN: 0.6000 mg | PEN_INJECTOR | Freq: Every day | SUBCUTANEOUS | 2 refills | Status: DC

## 2018-01-16 MED ORDER — OMEPRAZOLE 20 MG PO CPDR
20.0000 mg | DELAYED_RELEASE_CAPSULE | Freq: Two times a day (BID) | ORAL | 6 refills | Status: DC
Start: 2018-01-16 — End: 2018-09-18

## 2018-01-16 MED ORDER — LIRAGLUTIDE -WEIGHT MANAGEMENT 18 MG/3ML ~~LOC~~ SOPN: 0.6000 mg | PEN_INJECTOR | Freq: Every day | SUBCUTANEOUS | 3 refills | Status: DC

## 2018-01-16 NOTE — Progress Notes (Signed)
Patient ID: Sonya Olson MRN: [id number], DOB: 12-16-1980, 37 y.o. Date of Encounter: @DATE @  Chief Complaint:  Chief Complaint  Patient presents with  . follow up depression  . Weight Loss    discuss saxenda dose     HPI: 37 y.o. year old female  presents with above.   I reviewed Dr. Deirdre Peer note from 09/14/2017.  That note documented that in February 2019 she was started on Lexapro 5 mg to help with anxiety in the setting of stressors and chronical medical problems including severe hidradenitis causing recurrent infections on her skin.  At that follow-up visit 08/2017 she did feel like the Lexapro 5 mg was helping some but wanted to try a higher dose.  At that time was increased to 7.5 mg daily.  At her visit 09/14/2017 she also was on Saxenda and metformin secondary to morbid obesity, PCOS.   In February 2019 weight was 302 pounds.   At visit 09/14/2017 weight was 293 pounds.  At time of visit 08/2017 she was scheduled to have surgery on August 19 for right axilla/arm hidradenitis. He had appointment with her GYN scheduled for the following Friday.   TODAY: Today she reports that before her surgery they were going to have her come off of medications so she went ahead and stop the Saxenda about 1 week prior to surgery so that her body could be adjusting to being off the medicine prior to going through surgery. That the surgery was performed August 19. She states that she is back on the metformin and is taking the Lexapro 7.5 mg daily. Also states that she is on omeprazole twice daily and needs refill on this. Has been off of the Saxenda since about 1 week prior to surgery but does want to get back on the Saxenda. No other additional concerns to address today.      Past Medical History:  Diagnosis Date  . Allergy   . Asthma   . Eczema   . Hydradenitis   . Kidney stones 2008   last episode  . PCOS (polycystic ovarian syndrome)      Home Meds: Outpatient  Medications Prior to Visit  Medication Sig Dispense Refill  . albuterol (PROVENTIL HFA;VENTOLIN HFA) 108 (90 Base) MCG/ACT inhaler Inhale 1-2 puffs into the lungs every 6 (six) hours as needed for wheezing or shortness of breath.    . desonide (DESOWEN) 0.05 % cream Apply 1 application topically daily as needed.    Marland Kitchen escitalopram (LEXAPRO) 5 MG tablet Take 1.5 tablets (7.5 mg total) by mouth at bedtime. 45 tablet 3  . ferrous sulfate (FEOSOL) 325 (65 FE) MG tablet Take 1 tablet (325 mg total) by mouth daily with breakfast.  3  . fluocinonide cream (LIDEX) 0.05 % Apply 1 application topically daily.    . fluticasone (FLONASE) 50 MCG/ACT nasal spray Place 1 spray into both nostrils 2 (two) times daily as needed for allergies or rhinitis. 16 g 5  . inFLIXimab in sodium chloride 0.9 % Inject 5 mg/kg into the vein every 8 (eight) weeks.    Marland Kitchen levocetirizine (XYZAL) 5 MG tablet Take 1 tablet (5 mg total) by mouth every evening. 90 tablet 3  . levonorgestrel-ethinyl estradiol (AVIANE,ALESSE,LESSINA) 0.1-20 MG-MCG tablet Take 1 tablet by mouth.    . loratadine (CLARITIN) 10 MG tablet Take 10 mg by mouth daily.    . metFORMIN (GLUMETZA) 500 MG (MOD) 24 hr tablet Take 1 tablet (500 mg total) by mouth daily with  breakfast. 90 tablet 0  . montelukast (SINGULAIR) 10 MG tablet Take 10 mg by mouth at bedtime.    . Liraglutide -Weight Management (SAXENDA) 18 MG/3ML SOPN Inject 0.6 mg into the skin daily. Increase to max of 3mg  daily 5 pen 2  . omeprazole (PRILOSEC) 20 MG capsule Take 1 capsule (20 mg total) by mouth 2 (two) times daily before a meal. 60 capsule 6  . predniSONE (DELTASONE) 10 MG tablet 3 tabs bid x3 days, 2 tabs bid x3 days, 1 tab bid x3 days 36 tablet 0   No facility-administered medications prior to visit.     Allergies:  Allergies  Allergen Reactions  . Bee Pollen   . Other     Grass, trees, pet dander, dust    . Strawberry Extract   . Golimumab Itching    Social History    Socioeconomic History  . Marital status: Married    Spouse name: Not on file  . Number of children: Not on file  . Years of education: Not on file  . Highest education level: Not on file  Occupational History  . Not on file  Social Needs  . Financial resource strain: Not on file  . Food insecurity:    Worry: Not on file    Inability: Not on file  . Transportation needs:    Medical: Not on file    Non-medical: Not on file  Tobacco Use  . Smoking status: Never Smoker  . Smokeless tobacco: Never Used  Substance and Sexual Activity  . Alcohol use: No  . Drug use: No  . Sexual activity: Yes    Partners: Male    Birth control/protection: Pill    Comment: ALESSE  Lifestyle  . Physical activity:    Days per week: Not on file    Minutes per session: Not on file  . Stress: Not on file  Relationships  . Social connections:    Talks on phone: Not on file    Gets together: Not on file    Attends religious service: Not on file    Active member of club or organization: Not on file    Attends meetings of clubs or organizations: Not on file    Relationship status: Not on file  . Intimate partner violence:    Fear of current or ex partner: Not on file    Emotionally abused: Not on file    Physically abused: Not on file    Forced sexual activity: Not on file  Other Topics Concern  . Not on file  Social History Narrative   Entered 09/2017:   Married.   One child daughter age 66   Works at [employer] in [place]    Family History  Problem Relation Age of Onset  . Hypertension Mother   . Diabetes Mother   . Heart disease Mother 42       CABG @ 19  . Thyroid disease Sister   . Kidney disease Sister        2 kidney transplants  . Cancer Sister        OVARIAN  . Alcohol abuse Father   . Asthma Daughter   . Hypertension Maternal Uncle   . Diabetes Maternal Uncle   . Heart disease Maternal Uncle 51       CABG @ 51  . Stroke Maternal Grandmother   .  Hypertension Maternal Grandfather   . Heart disease Maternal Grandfather 41  Died of MI @ 52--No Dxed CAD prior  . Mental illness Maternal Uncle      Review of Systems:  See HPI for pertinent ROS. All other ROS negative.    Physical Exam: Blood pressure 120/84, pulse 90, temperature 98.5 F (36.9 C), temperature source Oral, resp. rate 18, height 5\' 6"  (1.676 m), weight (!) 137.3 kg, last menstrual period 11/15/2017, SpO2 97 %., Body mass index is 48.87 kg/m. General: Obese African-American female . appears in no acute distress. Neck: Supple. No thyromegaly. No lymphadenopathy. Lungs: Clear bilaterally to auscultation without wheezes, rales, or rhonchi. Breathing is unlabored. Heart: RRR with S1 S2. No murmurs, rubs, or gallops. Musculoskeletal:  Strength and tone normal for age. Extremities/Skin: Dressings are still in place at right axilla. Neuro: Alert and oriented X 3. Moves all extremities spontaneously. Gait is normal. CNII-XII grossly in tact. Psych:  Responds to questions appropriately with a normal affect.     ASSESSMENT AND PLAN:  37 y.o. year old female with   1. OSA (obstructive sleep apnea) Given her severe obesity and comorbidities will resume Saxenda. We will plan follow-up office visit 3 months.  Follow-up sooner if needed. - Liraglutide -Weight Management (SAXENDA) 18 MG/3ML SOPN; Inject 0.6 mg into the skin daily. Increase to max of 3mg  daily  Dispense: 5 pen; Refill: 2 - Liraglutide -Weight Management (SAXENDA) 18 MG/3ML SOPN; Inject 0.6 mg into the skin daily. 0.6 mg daily for 1 week, 1.2 mg daily for 1 week, 1.8 mg daily for 1 week, 2.4 mg for 1 week and then 3 mg daily  Dispense: 3 pen; Refill: 3  2. PCOS (polycystic ovarian syndrome) Given her severe obesity and comorbidities will resume Saxenda.  - Liraglutide -Weight Management (SAXENDA) 18 MG/3ML SOPN; Inject 0.6 mg into the skin daily. Increase to max of 3mg  daily  Dispense: 5 pen; Refill: 2 -  Liraglutide -Weight Management (SAXENDA) 18 MG/3ML SOPN; Inject 0.6 mg into the skin daily. 0.6 mg daily for 1 week, 1.2 mg daily for 1 week, 1.8 mg daily for 1 week, 2.4 mg for 1 week and then 3 mg daily  Dispense: 3 pen; Refill: 3  3. Hydradenitis Given her severe obesity and comorbidities will resume Saxenda. - Liraglutide -Weight Management (SAXENDA) 18 MG/3ML SOPN; Inject 0.6 mg into the skin daily. Increase to max of 3mg  daily  Dispense: 5 pen; Refill: 2 - Liraglutide -Weight Management (SAXENDA) 18 MG/3ML SOPN; Inject 0.6 mg into the skin daily. 0.6 mg daily for 1 week, 1.2 mg daily for 1 week, 1.8 mg daily for 1 week, 2.4 mg for 1 week and then 3 mg daily  Dispense: 3 pen; Refill: 3  4. Severe obesity (BMI >= 40) (HCC) Given her severe obesity and comorbidities will resume Saxenda.  - Liraglutide -Weight Management (SAXENDA) 18 MG/3ML SOPN; Inject 0.6 mg into the skin daily. Increase to max of 3mg  daily  Dispense: 5 pen; Refill: 2 - Liraglutide -Weight Management (SAXENDA) 18 MG/3ML SOPN; Inject 0.6 mg into the skin daily. 0.6 mg daily for 1 week, 1.2 mg daily for 1 week, 1.8 mg daily for 1 week, 2.4 mg for 1 week and then 3 mg daily  Dispense: 3 pen; Refill: 3  5. Situational depression Stable with Lexapro 7.5 mg.  Continue this dose the same.  6. Family history of premature coronary artery disease Given her severe obesity and comorbidities will resume Saxenda.  - Liraglutide -Weight Management (SAXENDA) 18 MG/3ML SOPN; Inject 0.6 mg into the skin  daily. Increase to max of 3mg  daily  Dispense: 5 pen; Refill: 2 - Liraglutide -Weight Management (SAXENDA) 18 MG/3ML SOPN; Inject 0.6 mg into the skin daily. 0.6 mg daily for 1 week, 1.2 mg daily for 1 week, 1.8 mg daily for 1 week, 2.4 mg for 1 week and then 3 mg daily  Dispense: 3 pen; Refill: 3  7. Gastroesophageal reflux disease, esophagitis presence not specified Her GERD is controlled with using omeprazole twice daily.  Weight loss  will also help this.  Resume the Saxenda. - omeprazole (PRILOSEC) 20 MG capsule; Take 1 capsule (20 mg total) by mouth 2 (two) times daily before a meal.  Dispense: 60 capsule; Refill: 6 - Liraglutide -Weight Management (SAXENDA) 18 MG/3ML SOPN; Inject 0.6 mg into the skin daily. Increase to max of 3mg  daily  Dispense: 5 pen; Refill: 2 - Liraglutide -Weight Management (SAXENDA) 18 MG/3ML SOPN; Inject 0.6 mg into the skin daily. 0.6 mg daily for 1 week, 1.2 mg daily for 1 week, 1.8 mg daily for 1 week, 2.4 mg for 1 week and then 3 mg daily  Dispense: 3 pen; Refill: 3  Plan follow-up visit 3 months.  Follow-up sooner if needed.  [address]igned, Kryslyn Helbig Beth QuebradillasDixon, [place]PA, [facility] HospitalBSFM 01/16/2018 4:12 PM

## 2018-02-21 ENCOUNTER — Encounter: Payer: Self-pay | Admitting: Family Medicine

## 2018-03-03 ENCOUNTER — Emergency Department (HOSPITAL_COMMUNITY): Payer: BC Managed Care – PPO

## 2018-03-03 ENCOUNTER — Other Ambulatory Visit: Payer: Self-pay

## 2018-03-03 ENCOUNTER — Inpatient Hospital Stay (HOSPITAL_COMMUNITY)
Admission: EM | Admit: 2018-03-03 | Discharge: 2018-03-05 | DRG: 872 | Disposition: A | Discharge status: Home / Self Care | Payer: BC Managed Care – PPO | Attending: Nephrology | Admitting: Nephrology

## 2018-03-03 ENCOUNTER — Encounter (HOSPITAL_COMMUNITY): Payer: Self-pay | Admitting: Emergency Medicine

## 2018-03-03 DIAGNOSIS — Z888 Allergy status to other drugs, medicaments and biological substances status: Secondary | ICD-10-CM | POA: Diagnosis not present

## 2018-03-03 DIAGNOSIS — J4530 Mild persistent asthma, uncomplicated: Secondary | ICD-10-CM | POA: Diagnosis present

## 2018-03-03 DIAGNOSIS — L03113 Cellulitis of right upper limb: Secondary | ICD-10-CM | POA: Diagnosis present

## 2018-03-03 DIAGNOSIS — Z79899 Other long term (current) drug therapy: Secondary | ICD-10-CM

## 2018-03-03 DIAGNOSIS — D509 Iron deficiency anemia, unspecified: Secondary | ICD-10-CM | POA: Diagnosis present

## 2018-03-03 DIAGNOSIS — A419 Sepsis, unspecified organism: Secondary | ICD-10-CM | POA: Diagnosis present

## 2018-03-03 DIAGNOSIS — L0390 Cellulitis, unspecified: Secondary | ICD-10-CM

## 2018-03-03 DIAGNOSIS — G4733 Obstructive sleep apnea (adult) (pediatric): Secondary | ICD-10-CM | POA: Diagnosis present

## 2018-03-03 DIAGNOSIS — L03111 Cellulitis of right axilla: Secondary | ICD-10-CM

## 2018-03-03 DIAGNOSIS — E119 Type 2 diabetes mellitus without complications: Secondary | ICD-10-CM | POA: Diagnosis not present

## 2018-03-03 DIAGNOSIS — F418 Other specified anxiety disorders: Secondary | ICD-10-CM | POA: Diagnosis present

## 2018-03-03 DIAGNOSIS — D508 Other iron deficiency anemias: Secondary | ICD-10-CM | POA: Diagnosis not present

## 2018-03-03 DIAGNOSIS — Z6841 Body Mass Index (BMI) 40.0 and over, adult: Secondary | ICD-10-CM

## 2018-03-03 DIAGNOSIS — F4321 Adjustment disorder with depressed mood: Secondary | ICD-10-CM | POA: Diagnosis present

## 2018-03-03 DIAGNOSIS — R0789 Other chest pain: Secondary | ICD-10-CM | POA: Diagnosis present

## 2018-03-03 DIAGNOSIS — L732 Hidradenitis suppurativa: Secondary | ICD-10-CM | POA: Diagnosis present

## 2018-03-03 LAB — URINALYSIS, ROUTINE W REFLEX MICROSCOPIC
BILIRUBIN URINE: NEGATIVE
GLUCOSE, UA: NEGATIVE mg/dL
Ketones, ur: NEGATIVE mg/dL
Nitrite: NEGATIVE
Protein, ur: 30 mg/dL — AB
RBC / HPF: >50 RBC/hpf — ABNORMAL HIGH (ref 0–5)
SPECIFIC GRAVITY, URINE: 1.02 (ref 1.005–1.030)
pH: 8 (ref 5.0–8.0)

## 2018-03-03 LAB — CBC WITH DIFFERENTIAL/PLATELET
ABS IMMATURE GRANULOCYTES: 0.12 10*3/uL — AB (ref 0.00–0.07)
BASOS PCT: 0 %
Basophils Absolute: 0 10*3/uL (ref 0.0–0.1)
EOS ABS: 0 10*3/uL (ref 0.0–0.5)
Eosinophils Relative: 0 %
HCT: 35.5 % — ABNORMAL LOW (ref 36.0–46.0)
Hemoglobin: 11.4 g/dL — ABNORMAL LOW (ref 12.0–15.0)
IMMATURE GRANULOCYTES: 1 %
Lymphocytes Relative: 5 %
Lymphs Abs: 1.1 10*3/uL (ref 0.7–4.0)
MCH: 22.4 pg — ABNORMAL LOW (ref 26.0–34.0)
MCHC: 32.1 g/dL (ref 30.0–36.0)
MCV: 69.9 fL — AB (ref 80.0–100.0)
Monocytes Absolute: 1.2 10*3/uL — ABNORMAL HIGH (ref 0.1–1.0)
Monocytes Relative: 5 %
NEUTROS ABS: 20.9 10*3/uL — AB (ref 1.7–7.7)
NEUTROS PCT: 89 %
PLATELETS: 344 10*3/uL (ref 150–400)
RBC: 5.08 MIL/uL (ref 3.87–5.11)
RDW: 19.6 % — AB (ref 11.5–15.5)
WBC: 23.4 10*3/uL — AB (ref 4.0–10.5)
nRBC: 0 % (ref 0.0–0.2)

## 2018-03-03 LAB — COMPREHENSIVE METABOLIC PANEL
ALT: 19 U/L (ref 0–44)
AST: 17 U/L (ref 15–41)
Albumin: 3.5 g/dL (ref 3.5–5.0)
Alkaline Phosphatase: 58 U/L (ref 38–126)
Anion gap: 8 (ref 5–15)
BUN: 10 mg/dL (ref 6–20)
CHLORIDE: 105 mmol/L (ref 98–111)
CO2: 23 mmol/L (ref 22–32)
Calcium: 8.6 mg/dL — ABNORMAL LOW (ref 8.9–10.3)
Creatinine, Ser: 0.59 mg/dL (ref 0.44–1.00)
Glucose, Bld: 111 mg/dL — ABNORMAL HIGH (ref 70–99)
POTASSIUM: 3.9 mmol/L (ref 3.5–5.1)
Sodium: 136 mmol/L (ref 135–145)
Total Bilirubin: 0.9 mg/dL (ref 0.3–1.2)
Total Protein: 8.5 g/dL — ABNORMAL HIGH (ref 6.5–8.1)

## 2018-03-03 LAB — I-STAT BETA HCG BLOOD, ED (MC, WL, AP ONLY)

## 2018-03-03 LAB — TROPONIN I: Troponin I: <0.03 ng/mL (ref <0.03)

## 2018-03-03 LAB — I-STAT CG4 LACTIC ACID, ED: LACTIC ACID, VENOUS: 1.33 mmol/L (ref 0.5–1.9)

## 2018-03-03 LAB — LIPASE, BLOOD: LIPASE: 23 U/L (ref 11–51)

## 2018-03-03 MED ORDER — MONTELUKAST SODIUM 10 MG PO TABS
10.0000 mg | ORAL_TABLET | Freq: Every day | ORAL | Status: DC
  Administered 2018-03-03 – 2018-03-04 (×2): 10 mg via ORAL
  Filled 2018-03-03 (×2): qty 1

## 2018-03-03 MED ORDER — SODIUM CHLORIDE 0.9% FLUSH
3.0000 mL | Freq: Two times a day (BID) | INTRAVENOUS | Status: DC
  Administered 2018-03-03: 3 mL via INTRAVENOUS

## 2018-03-03 MED ORDER — ACETAMINOPHEN 325 MG PO TABS: 650.0000 mg | ORAL_TABLET | Freq: Four times a day (QID) | ORAL | Status: DC | PRN

## 2018-03-03 MED ORDER — ONDANSETRON HCL 4 MG/2ML IJ SOLN
4.0000 mg | Freq: Once | INTRAMUSCULAR | Status: AC
  Administered 2018-03-03: 4 mg via INTRAVENOUS
  Filled 2018-03-03: qty 2

## 2018-03-03 MED ORDER — SODIUM CHLORIDE 0.9 % IV BOLUS
1000.0000 mL | Freq: Once | INTRAVENOUS | Status: AC
  Administered 2018-03-03: 1000 mL via INTRAVENOUS

## 2018-03-03 MED ORDER — MORPHINE SULFATE (PF) 4 MG/ML IV SOLN
4.0000 mg | Freq: Once | INTRAVENOUS | Status: AC
  Administered 2018-03-03: 4 mg via INTRAVENOUS
  Filled 2018-03-03: qty 1

## 2018-03-03 MED ORDER — PANTOPRAZOLE SODIUM 40 MG PO TBEC
40.0000 mg | DELAYED_RELEASE_TABLET | Freq: Two times a day (BID) | ORAL | Status: DC
  Administered 2018-03-04 – 2018-03-05 (×3): 40 mg via ORAL
  Filled 2018-03-03 (×3): qty 1

## 2018-03-03 MED ORDER — ONDANSETRON HCL 4 MG/2ML IJ SOLN: 4.0000 mg | Freq: Four times a day (QID) | INTRAMUSCULAR | Status: DC | PRN

## 2018-03-03 MED ORDER — PIPERACILLIN-TAZOBACTAM 3.375 G IVPB
3.3750 g | Freq: Three times a day (TID) | INTRAVENOUS | Status: DC
  Administered 2018-03-04 – 2018-03-05 (×5): 3.375 g via INTRAVENOUS
  Filled 2018-03-03 (×5): qty 50

## 2018-03-03 MED ORDER — LEVONORGESTREL-ETHINYL ESTRAD 0.1-20 MG-MCG PO TABS: 1.0000 | ORAL_TABLET | Freq: Every day | ORAL | Status: DC

## 2018-03-03 MED ORDER — ENOXAPARIN SODIUM 80 MG/0.8ML ~~LOC~~ SOLN
70.0000 mg | SUBCUTANEOUS | Status: DC
  Administered 2018-03-03 – 2018-03-04 (×2): 70 mg via SUBCUTANEOUS
  Filled 2018-03-03 (×2): qty 0.8

## 2018-03-03 MED ORDER — ACETAMINOPHEN 650 MG RE SUPP
975.0000 mg | Freq: Once | RECTAL | Status: AC
  Administered 2018-03-03: 975 mg via RECTAL
  Filled 2018-03-03: qty 1

## 2018-03-03 MED ORDER — KETOROLAC TROMETHAMINE 30 MG/ML IJ SOLN
30.0000 mg | Freq: Once | INTRAMUSCULAR | Status: AC
  Administered 2018-03-03: 30 mg via INTRAVENOUS
  Filled 2018-03-03: qty 1

## 2018-03-03 MED ORDER — PIPERACILLIN-TAZOBACTAM 3.375 G IVPB 30 MIN
3.3750 g | Freq: Once | INTRAVENOUS | Status: AC
  Administered 2018-03-03: 3.375 g via INTRAVENOUS
  Filled 2018-03-03: qty 50

## 2018-03-03 MED ORDER — KETOROLAC TROMETHAMINE 30 MG/ML IJ SOLN: 30.0000 mg | Freq: Four times a day (QID) | INTRAMUSCULAR | Status: DC | PRN

## 2018-03-03 MED ORDER — CITALOPRAM HYDROBROMIDE 20 MG PO TABS
20.0000 mg | ORAL_TABLET | Freq: Every day | ORAL | Status: DC
  Filled 2018-03-03 (×2): qty 1

## 2018-03-03 MED ORDER — SODIUM CHLORIDE 0.9 % IV SOLN
INTRAVENOUS | Status: AC
  Administered 2018-03-03: 22:00:00 via INTRAVENOUS

## 2018-03-03 MED ORDER — ACETAMINOPHEN 500 MG PO TABS: 1000.0000 mg | ORAL_TABLET | Freq: Once | ORAL | Status: DC

## 2018-03-03 MED ORDER — IOPAMIDOL (ISOVUE-370) INJECTION 76%
100.0000 mL | Freq: Once | INTRAVENOUS | Status: AC | PRN
  Administered 2018-03-03: 100 mL via INTRAVENOUS

## 2018-03-03 MED ORDER — HYDROCODONE-ACETAMINOPHEN 5-325 MG PO TABS
1.0000 | ORAL_TABLET | ORAL | Status: DC | PRN
  Administered 2018-03-05: 1 via ORAL
  Filled 2018-03-03 (×2): qty 1

## 2018-03-03 MED ORDER — MORPHINE SULFATE (PF) 4 MG/ML IV SOLN: 4.0000 mg | INTRAVENOUS | Status: DC | PRN

## 2018-03-03 MED ORDER — ALBUTEROL SULFATE (2.5 MG/3ML) 0.083% IN NEBU: 3.0000 mL | INHALATION_SOLUTION | Freq: Four times a day (QID) | RESPIRATORY_TRACT | Status: DC | PRN

## 2018-03-03 MED ORDER — ONDANSETRON HCL 4 MG PO TABS: 4.0000 mg | ORAL_TABLET | Freq: Four times a day (QID) | ORAL | Status: DC | PRN

## 2018-03-03 MED ORDER — MORPHINE SULFATE (PF) 4 MG/ML IV SOLN
3.0000 mg | Freq: Four times a day (QID) | INTRAVENOUS | Status: DC | PRN
  Administered 2018-03-04 (×4): 3 mg via INTRAVENOUS
  Filled 2018-03-03 (×4): qty 1

## 2018-03-03 MED ORDER — VANCOMYCIN HCL IN DEXTROSE 1-5 GM/200ML-% IV SOLN
1000.0000 mg | Freq: Once | INTRAVENOUS | Status: AC
  Administered 2018-03-03: 1000 mg via INTRAVENOUS
  Filled 2018-03-03: qty 200

## 2018-03-03 MED ORDER — SENNOSIDES-DOCUSATE SODIUM 8.6-50 MG PO TABS
1.0000 | ORAL_TABLET | Freq: Every evening | ORAL | Status: DC | PRN
  Administered 2018-03-04: 1 via ORAL
  Filled 2018-03-03: qty 1

## 2018-03-03 MED ORDER — HYDROCODONE-ACETAMINOPHEN 5-325 MG PO TABS: 1.0000 | ORAL_TABLET | ORAL | Status: DC | PRN

## 2018-03-03 MED ORDER — FOLIC ACID 1 MG PO TABS
3.0000 mg | ORAL_TABLET | Freq: Every day | ORAL | Status: DC
  Administered 2018-03-04 – 2018-03-05 (×2): 3 mg via ORAL
  Filled 2018-03-03 (×2): qty 3

## 2018-03-03 MED ORDER — ACETAMINOPHEN 650 MG RE SUPP: 650.0000 mg | Freq: Four times a day (QID) | RECTAL | Status: DC | PRN

## 2018-03-03 NOTE — ED Triage Notes (Signed)
Called out for cp. fever with EMS. Pt had right arm surgery aug 19 in armpit due to boil per pt. cbg 115. A/o upon arrival. Pt states pain to central chest into back with vomiitng started earlier today.

## 2018-03-03 NOTE — ED Notes (Signed)
Pt taken to ct 

## 2018-03-03 NOTE — Progress Notes (Addendum)
Patient refused CPAP has not worn one in 4 years as stated by her. CPAP order has been discontinued.

## 2018-03-03 NOTE — ED Provider Notes (Signed)
[facility] EMERGENCY DEPARTMENT Provider Note   CSN: [id number] Arrival date & time: 03/03/18  1540     History   Chief Complaint Chief Complaint  Patient presents with  . Chest Pain  . Arm Pain    HPI Sonya Olson is a 37 y.o. female.  HPI Patient has history of severe hidradenitis suppurativa for which she is followed by dermatology.  She is currently taking Levaquin and Flagyl.  She developed central chest pain, dry heaving which started this afternoon.  Associated with diaphoresis.  She also is complaining of right arm pain and is noticed some red blotches to the arm.  Denies any fever.  States she has had some shortness of breath and minimal cough.  No diarrhea.  Patient is also complaining of generalized headache.  No focal weakness or numbness.  No neck pain or stiffness. Past Medical History:  Diagnosis Date  . Allergy   . Asthma   . Eczema   . Hydradenitis    on mtx and remicade through [facility]  . Kidney stones 2008   last episode  . PCOS (polycystic ovarian syndrome)     Patient Active Problem List   Diagnosis Date Noted  . Sepsis due to cellulitis (HCC) 03/03/2018  . GERD (gastroesophageal reflux disease) 01/16/2018  . Situational depression 06/20/2017  . Mild persistent asthma 11/12/2015  . Atopic dermatitis 11/12/2015  . Perennial and seasonal allergic rhinitis 01/06/2015  . Hydradenitis 01/06/2015  . OSA (obstructive sleep apnea) 01/06/2015  . Family history of premature coronary artery disease 01/06/2015  . Severe obesity (BMI >= 40) (HCC) 01/06/2015  . PCOS (polycystic ovarian syndrome) 08/28/2011    Past Surgical History:  Procedure Laterality Date  . APPENDECTOMY    . LITHOTRIPSY  2008  . NECK SURGERY       OB History    Gravida  1   Para  1   Term  1   Preterm      AB      Living  1     SAB      TAB      Ectopic      Multiple      Live Births               Home Medications    Prior to Admission  medications   Medication Sig Start Date End Date Taking? Authorizing Provider  acetaminophen (TYLENOL) 325 MG tablet Take 650 mg by mouth every 6 (six) hours as needed for mild pain or headache.   Yes [provider]  albuterol (PROVENTIL HFA;VENTOLIN HFA) 108 (90 Base) MCG/ACT inhaler Inhale 1-2 puffs into the lungs every 6 (six) hours as needed for wheezing or shortness of breath.   Yes [provider]  desonide (DESOWEN) 0.05 % cream Apply 1 application topically daily as needed (rash).  10/20/14  Yes [provider]  escitalopram (LEXAPRO) 5 MG tablet Take 1.5 tablets (7.5 mg total) by mouth at bedtime. 11/09/17  Yes Sutherland, Velna Hatchet, MD  ferrous sulfate (FEOSOL) 325 (65 FE) MG tablet Take 1 tablet (325 mg total) by mouth daily with breakfast. 09/27/17  Yes [place], Velna Hatchet, MD  fluocinonide cream (LIDEX) 0.05 % Apply 1 application topically daily.   Yes [provider]  folic acid (FOLVITE) 1 MG tablet Take 3 tablets by mouth daily. Do not take with Methotrexate 02/18/18 02/18/19 Yes [provider]  levocetirizine (XYZAL) 5 MG tablet Take 1 tablet (5 mg total)  by mouth every evening. 09/26/17  Yes Walsh, Velna Hatchet, MD  levonorgestrel-ethinyl estradiol (AVIANE,ALESSE,LESSINA) 0.1-20 MG-MCG tablet Take 1 tablet by mouth.   Yes [provider]  Liraglutide -Weight Management (SAXENDA) 18 MG/3ML SOPN Inject 0.6 mg into the skin daily. Increase to max of 3mg  daily Patient taking differently: Inject 3 mg into the skin daily. Increase to max of 3mg  daily 01/16/18  Yes Allayne Butcher B, PA-C  loratadine (CLARITIN) 10 MG tablet Take 10 mg by mouth daily.   Yes [provider]  metFORMIN (GLUMETZA) 500 MG (MOD) 24 hr tablet Take 1 tablet (500 mg total) by mouth daily with breakfast. 09/27/17  Yes [place], Velna Hatchet, MD  methotrexate (RHEUMATREX) 2.5 MG tablet Take 6 tablets by mouth once a week. Takes on Saturdays 02/18/18  Yes [provider]  montelukast (SINGULAIR) 10 MG tablet Take 10 mg by mouth at bedtime.   Yes [provider]  omeprazole (PRILOSEC) 20 MG capsule Take 1 capsule (20 mg total) by mouth 2 (two) times daily before a meal. 01/16/18  Yes Dixon, Mary B, PA-C  fluticasone (FLONASE) 50 MCG/ACT nasal spray Place 1 spray into both nostrils 2 (two) times daily as needed for allergies or rhinitis. 10/11/17   Alfonse Spruce, MD  inFLIXimab in sodium chloride 0.9 % Inject 5 mg/kg into the vein every 8 (eight) weeks.    [provider]  Liraglutide -Weight Management (SAXENDA) 18 MG/3ML SOPN Inject 0.6 mg into the skin daily. 0.6 mg daily for 1 week, 1.2 mg daily for 1 week, 1.8 mg daily for 1 week, 2.4 mg for 1 week and then 3 mg daily 01/16/18   Dorena Bodo, PA-C    Family History Family History  Problem Relation Age of Onset  . Hypertension Mother   . Diabetes Mother   . Heart disease Mother 76       CABG @ 78  . Thyroid disease Sister   . Kidney disease Sister        2 kidney transplants  . Cancer Sister        OVARIAN  . Alcohol abuse Father   . Asthma Daughter   . Hypertension Maternal Uncle   . Diabetes Maternal Uncle   . Heart disease Maternal Uncle 51       CABG @ 51  . Stroke Maternal Grandmother   . Hypertension Maternal Grandfather   . Heart disease Maternal Grandfather 36       Died of MI @ 52--No Dxed CAD prior  . Mental illness Maternal Uncle     Social History Social History   Tobacco Use  . Smoking status: Never Smoker  . Smokeless tobacco: Never Used  Substance Use Topics  . Alcohol use: No  . Drug use: No     Allergies   Bee pollen; Other; Strawberry extract; and Golimumab   Review of Systems Review of Systems  Constitutional: Positive for diaphoresis. Negative for chills and fever.  HENT: Negative for sore throat and trouble swallowing.   Respiratory: Positive for cough and shortness of breath.   Cardiovascular: Positive for chest pain. Negative  for palpitations and leg swelling.  Gastrointestinal: Positive for abdominal pain and nausea. Negative for constipation, diarrhea and vomiting.  Genitourinary: Negative for dysuria, flank pain and frequency.  Musculoskeletal: Positive for back pain and myalgias. Negative for arthralgias and neck pain.  Skin: Positive for color change. Negative for rash and wound.  Neurological: Positive for headaches. Negative for dizziness,  weakness, light-headedness and numbness.  All other systems reviewed and are negative.    Physical Exam Updated Vital Signs BP 123/81   Pulse (!) 108   Temp 99.7 F (37.6 C) (Oral)   Resp 14   Ht 5\' 7"  (1.702 m)   Wt 136.1 kg   LMP 11/28/2017   SpO2 100%   BMI 46.99 kg/m   Physical Exam  Constitutional: She is oriented to person, place, and time. She appears well-developed and well-nourished. No distress.  HENT:  Head: Normocephalic and atraumatic.  Mouth/Throat: Oropharynx is clear and moist. No oropharyngeal exudate.  Eyes: Pupils are equal, round, and reactive to light. EOM are normal.  Neck: Normal range of motion. Neck supple. No JVD present.  No meningismus  Cardiovascular: Regular rhythm.  Tachycardia  Pulmonary/Chest: Effort normal and breath sounds normal. No stridor. No respiratory distress. She has no wheezes. She has no rales. She exhibits tenderness.  Right parasternal and central chest wall tenderness to palpation.  No crepitance or deformity.  Abdominal: Soft. Bowel sounds are normal. There is tenderness. There is no rebound and no guarding.  Epigastric tenderness to palpation.  No rebound or guarding.  Musculoskeletal: Normal range of motion. She exhibits no edema or tenderness.  Swelling diffusely to the right upper extremity.  Right upper extremity is warm with several patches of erythema.  Postsurgical axillary scar is present which is warm and tender to to palpation.  No obvious drainage.  No lower extremity swelling, asymmetry or  tenderness.  Distal pulses intact.  Lymphadenopathy:    She has no cervical adenopathy.  Neurological: She is alert and oriented to person, place, and time.  Moving all extremities without focal deficit.  Sensation intact.  Skin: Skin is warm and dry. No rash noted. She is not diaphoretic. There is erythema.  Psychiatric: She has a normal mood and affect. Her behavior is normal.  Nursing note and vitals reviewed.    ED Treatments / Results  Labs (all labs ordered are listed, but only abnormal results are displayed) Labs Reviewed  COMPREHENSIVE METABOLIC PANEL - Abnormal; Notable for the following components:      Result Value   Glucose, Bld 111 (*)    Calcium 8.6 (*)    Total Protein 8.5 (*)    All other components within normal limits  CBC WITH DIFFERENTIAL/PLATELET - Abnormal; Notable for the following components:   WBC 23.4 (*)    Hemoglobin 11.4 (*)    HCT 35.5 (*)    MCV 69.9 (*)    MCH 22.4 (*)    RDW 19.6 (*)    Neutro Abs 20.9 (*)    Monocytes Absolute 1.2 (*)    Abs Immature Granulocytes 0.12 (*)    All other components within normal limits  URINALYSIS, ROUTINE W REFLEX MICROSCOPIC - Abnormal; Notable for the following components:   APPearance HAZY (*)    Hgb urine dipstick MODERATE (*)    Protein, ur 30 (*)    Leukocytes, UA TRACE (*)    RBC / HPF >50 (*)    Bacteria, UA RARE (*)    All other components within normal limits  CULTURE, BLOOD (ROUTINE X 2)  CULTURE, BLOOD (ROUTINE X 2)  TROPONIN I  LIPASE, BLOOD  I-STAT CG4 LACTIC ACID, ED  I-STAT BETA HCG BLOOD, ED ([facility], [facility], [facility] ONLY)  I-STAT CG4 LACTIC ACID, ED    EKG EKG Interpretation  Date/Time:  Sunday March 03 2018 15:51:37 EST Ventricular Rate:  121 PR  Interval:    QRS Duration: 80 QT Interval:  309 QTC Calculation: 439 R Axis:   33 Text Interpretation:  Sinus tachycardia Borderline T abnormalities, diffuse leads Confirmed by Loren Racer ([id number]) on 03/03/2018 4:12:17 PM   Radiology Dg  Chest 2 View  Result Date: 03/03/2018 CLINICAL DATA:  Chest pain.  Fever. EXAM: CHEST - 2 VIEW COMPARISON:  May 29, 2009 FINDINGS: Stable right Port-A-Cath. The cardiomediastinal silhouette is unchanged. No pulmonary nodules or masses. No focal infiltrates. IMPRESSION: No active cardiopulmonary disease. Electronically Signed   By: Gerome Sam III M.D   On: 03/03/2018 17:29   Ct Angio Chest Pe W And/or Wo Contrast  Result Date: 03/03/2018 CLINICAL DATA:  Central chest pain EXAM: CT ANGIOGRAPHY CHEST WITH CONTRAST TECHNIQUE: Multidetector CT imaging of the chest was performed using the standard protocol during bolus administration of intravenous contrast. Multiplanar CT image reconstructions and MIPs were obtained to evaluate the vascular anatomy. CONTRAST:  ISOVUE-370 IOPAMIDOL (ISOVUE-370) INJECTION 76% COMPARISON:  Chest x-ray from earlier in the same day. FINDINGS: Cardiovascular: Thoracic aorta shows a normal branching pattern without aneurysmal dilatation or dissection. The heart is within normal limits. No pericardial effusion is seen. The pulmonary artery shows a normal branching pattern without intraluminal filling defect to suggest pulmonary embolism. Mediastinum/Nodes: The esophagus is within normal limits. No hilar or mediastinal adenopathy is seen. The thoracic inlet demonstrates changes consistent with a multinodular goiter. Right chest wall port is noted. Lungs/Pleura: Lungs are well aerated bilaterally. No focal infiltrate or sizable effusion is seen. No pneumothorax is noted. Minimal scarring is noted in the left base. Upper Abdomen: Visualized upper abdomen is within normal limits. Musculoskeletal: No chest wall abnormality. No acute or significant osseous findings. Review of the MIP images confirms the above findings. IMPRESSION: No evidence of pulmonary emboli. Multinodular goiter. No acute abnormality noted. Electronically Signed   By: Alcide Clever M.D.   On: 03/03/2018 19:15      Procedures Procedures (including critical care time)  Medications Ordered in ED Medications  sodium chloride 0.9 % bolus 1,000 mL (1,000 mLs Intravenous New Bag/Given 03/03/18 1945)  piperacillin-tazobactam (ZOSYN) IVPB 3.375 g (3.375 g Intravenous New Bag/Given 03/03/18 1942)  vancomycin (VANCOCIN) IVPB 1000 mg/200 mL premix (1,000 mg Intravenous New Bag/Given 03/03/18 1945)  sodium chloride 0.9 % bolus 1,000 mL (0 mLs Intravenous Stopped 03/03/18 1746)  ondansetron (ZOFRAN) injection 4 mg (4 mg Intravenous Given 03/03/18 1615)  acetaminophen (TYLENOL) suppository 975 mg (975 mg Rectal Given 03/03/18 1608)  morphine 4 MG/ML injection 4 mg (4 mg Intravenous Given 03/03/18 1701)  ketorolac (TORADOL) 30 MG/ML injection 30 mg (30 mg Intravenous Given 03/03/18 1831)  iopamidol (ISOVUE-370) 76 % injection 100 mL (100 mLs Intravenous Contrast Given 03/03/18 1839)  morphine 4 MG/ML injection 4 mg (4 mg Intravenous Given 03/03/18 1927)     Initial Impression / Assessment and Plan / ED Course  I have reviewed the triage vital signs and the nursing notes.  Pertinent labs & imaging results that were available during my care of the patient were reviewed by me and considered in my medical decision making (see chart for details).     Patient is on Remicade and methotrexate as well as 2 antibiotics for her hidradenitis.  Presenting with acute onset dry heaving, epigastric pain, chest pain radiating to her back and swelling redness to her right upper extremity. CT angios chest without acute findings.  Normal lactic acid.  Patient does have elevation in her white blood  cell count.  Suspect some type of infectious process as complications from her hidradenitis.  Given she is already on oral antibiotics will start broad-spectrum IV antibiotics and discuss with hospitalist. Final Clinical Impressions(s) / ED Diagnoses   Final diagnoses:  Cellulitis of right axilla  Atypical chest pain    ED Discharge  Orders    None       Loren Racer, MD 03/03/18 2007

## 2018-03-03 NOTE — ED Notes (Signed)
Pt states all symptoms started today. Pt right arm has sporadic redness noted to areas. Pt c/o upper abd pain and headache as well. edp in with pt now. Right arm warm to touch. No drainage noted to under right arm pit

## 2018-03-03 NOTE — Progress Notes (Signed)
Pharmacy Antibiotic Note  Sonya Olson is a 37 y.o. female admitted on 03/03/2018 with  sepsis due to cellulitis.  Pharmacy has been consulted for vancomycin and Zosyn  dosing.  Plan: Start Zosyn 3.375g IV q8h (4-hr inf) Loading dose:  Vancomycin 2g IV x1 dose Maintenance dose:  Vancomycin 1g IVq8h Goal vancomycin  trough range:  15-20  mcg/mL Pharmacy will continue to monitor renal function, vancomycin troughs, cultures and patient progress.     Height: 5\' 7"  (170.2 cm) Weight: 300 lb (136.1 kg) IBW/kg (Calculated) : 61.6  Temp (24hrs), Avg:100.2 F (37.9 C), Min:99.7 F (37.6 C), Max:100.5 F (38.1 C)  Recent Labs  Lab 03/03/18 1629 03/03/18 1639  WBC 23.4*  --   CREATININE 0.59  --   LATICACIDVEN  --  1.33    Estimated Creatinine Clearance: 136.2 mL/min (by C-G formula based on SCr of 0.59 mg/dL).    Allergies  Allergen Reactions  . Bee Pollen   . Other     Grass, trees, pet dander, dust    . Strawberry Extract   . Golimumab Itching    Antimicrobials this admission: 11/3 vancomycin >>   11/3 Zosyn >>     Microbiology results: 11/3BCx2: in progress    Thank you for allowing pharmacy to be a part of this patient's care.  Tama High 03/03/2018 8:45 PM

## 2018-03-03 NOTE — H&P (Signed)
History and Physical    Sonya Olson:[id number] DOB: [birth year] DOA: 03/03/2018  PCP: Salley Scarlet, MD   Patient coming from: Home   Chief Complaint: Fever, chest pain, right arm pain with swelling and redness   HPI: Sonya Olson is a 37 y.o. female with medical history significant for obesity (BMI 47), hidradenitis suprativa, PCOS, OSA on CPAP, depression, iron deficiency anemia, and asthma, now presenting to the emergency department for evaluation of fevers, chills, malaise, chest pain, upper back pain, and right arm pain with swelling and redness.  Patient reports that she been in her usual state of health until developing a general malaise and fatigue yesterday.  Today she developed fevers, chills and noted pain throughout the right arm, particularly the upper arm, with associated redness and swelling.  She also went on to develop pain in the mid chest and upper back.  She had surgery in the right axilla in August for her early stage III hidradenitis, reports chronic drainage from groin and buttock lesions that does not seem to be worse than usual currently.  She denies any abscess or drainage from the right arm at this time.  The chest pain was localized to the central chest with no alleviating or exacerbating factors identified, and without any significant shortness of breath, cough, or pain or swelling in the lower extremities.  She has been managed with methotrexate and infliximab for her hidradenitis and reports recently being started on Levaquin and Flagyl.  ED Course: Upon arrival to the ED, patient is found to be febrile to 38.1 C, tachycardic to 120, and with stable blood pressure.  EKG features sinus tachycardia with rate 121 and chest x-ray is negative for acute cardiopulmonary disease.  CTA chest is negative for PE or any other acute findings.  Chemistry panel is unremarkable and CBC is notable for leukocytosis to 23,400 and a chronic microcytic anemia.  Lactic acid is  reassuringly normal and troponin is undetectable.  Blood cultures were collected and the patient was given 2 L normal saline, acetaminophen, Toradol, morphine, vancomycin, and Zosyn in the ED.  Chest pain is resolved, tachycardia has improved, blood pressure remained stable, and the patient will be admitted for ongoing evaluation and management of sepsis secondary to cellulitis.  Review of Systems:  All other systems reviewed and apart from HPI, are negative.  Past Medical History:  Diagnosis Date  . Allergy   . Asthma   . Eczema   . Hydradenitis    on mtx and remicade through [facility]  . Kidney stones 2008   last episode  . PCOS (polycystic ovarian syndrome)     Past Surgical History:  Procedure Laterality Date  . APPENDECTOMY    . LITHOTRIPSY  2008  . NECK SURGERY       reports that she has never smoked. She has never used smokeless tobacco. She reports that she does not drink alcohol or use drugs.  Allergies  Allergen Reactions  . Bee Pollen   . Other     Grass, trees, pet dander, dust    . Strawberry Extract   . Golimumab Itching    Family History  Problem Relation Age of Onset  . Hypertension Mother   . Diabetes Mother   . Heart disease Mother 30       CABG @ 30  . Thyroid disease Sister   . Kidney disease Sister        2 kidney transplants  . Cancer Sister  OVARIAN  . Alcohol abuse Father   . Asthma Daughter   . Hypertension Maternal Uncle   . Diabetes Maternal Uncle   . Heart disease Maternal Uncle 51       CABG @ 51  . Stroke Maternal Grandmother   . Hypertension Maternal Grandfather   . Heart disease Maternal Grandfather 44       Died of MI @ 52--No Dxed CAD prior  . Mental illness Maternal Uncle      Prior to Admission medications   Medication Sig Start Date End Date Taking? Authorizing Provider  acetaminophen (TYLENOL) 325 MG tablet Take 650 mg by mouth every 6 (six) hours as needed for mild pain or headache.   Yes [provider]  albuterol (PROVENTIL HFA;VENTOLIN HFA) 108 (90 Base) MCG/ACT inhaler Inhale 1-2 puffs into the lungs every 6 (six) hours as needed for wheezing or shortness of breath.   Yes [provider]  desonide (DESOWEN) 0.05 % cream Apply 1 application topically daily as needed (rash).  10/20/14  Yes [provider]  escitalopram (LEXAPRO) 5 MG tablet Take 1.5 tablets (7.5 mg total) by mouth at bedtime. 11/09/17  Yes [place], Velna Hatchet, MD  ferrous sulfate (FEOSOL) 325 (65 FE) MG tablet Take 1 tablet (325 mg total) by mouth daily with breakfast. 09/27/17  Yes [place], Velna Hatchet, MD  fluocinonide cream (LIDEX) 0.05 % Apply 1 application topically daily.   Yes [provider]  folic acid (FOLVITE) 1 MG tablet Take 3 tablets by mouth daily. Do not take with Methotrexate 02/18/18 02/18/19 Yes [provider]  levocetirizine (XYZAL) 5 MG tablet Take 1 tablet (5 mg total) by mouth every evening. 09/26/17  Yes [place], Velna Hatchet, MD  levonorgestrel-ethinyl estradiol (AVIANE,ALESSE,LESSINA) 0.1-20 MG-MCG tablet Take 1 tablet by mouth.   Yes [provider]  Liraglutide -Weight Management (SAXENDA) 18 MG/3ML SOPN Inject 0.6 mg into the skin daily. Increase to max of 3mg  daily Patient taking differently: Inject 3 mg into the skin daily. Increase to max of 3mg  daily 01/16/18  Yes Allayne Butcher B, PA-C  loratadine (CLARITIN) 10 MG tablet Take 10 mg by mouth daily.   Yes [provider]  metFORMIN (GLUMETZA) 500 MG (MOD) 24 hr tablet Take 1 tablet (500 mg total) by mouth daily with breakfast. 09/27/17  Yes [place], Velna Hatchet, MD  methotrexate (RHEUMATREX) 2.5 MG tablet Take 6 tablets by mouth once a week. Takes on Saturdays 02/18/18  Yes [provider]  montelukast (SINGULAIR) 10 MG tablet Take 10 mg by mouth at bedtime.   Yes [provider]  omeprazole (PRILOSEC) 20 MG capsule Take 1 capsule (20 mg total) by mouth 2 (two) times daily before a meal. 01/16/18   Yes [place], Mary B, PA-C  fluticasone (FLONASE) 50 MCG/ACT nasal spray Place 1 spray into both nostrils 2 (two) times daily as needed for allergies or rhinitis. 10/11/17   Alfonse Spruce, MD  inFLIXimab in sodium chloride 0.9 % Inject 5 mg/kg into the vein every 8 (eight) weeks.    [provider]  Liraglutide -Weight Management (SAXENDA) 18 MG/3ML SOPN Inject 0.6 mg into the skin daily. 0.6 mg daily for 1 week, 1.2 mg daily for 1 week, 1.8 mg daily for 1 week, 2.4 mg for 1 week and then 3 mg daily 01/16/18   Dorena Bodo, PA-C    Physical Exam: Vitals:   03/03/18 1926 03/03/18 1930 03/03/18 1945 03/03/18 2000  BP: 102/71 123/81 (!) 100/56  108/61  Pulse: (!) 106 (!) 108 (!) 104 100  Resp:  14 13 20   Temp:      TempSrc:      SpO2: 98% 100% 95% 93%  Weight:      Height:        Constitutional: NAD, calm  Eyes: PERTLA, lids and conjunctivae normal ENMT: Mucous membranes are moist. Posterior pharynx clear of any exudate or lesions.   Neck: normal, supple, no masses, no thyromegaly Respiratory: clear to auscultation bilaterally, no wheezing, no crackles. Normal respiratory effort.    Cardiovascular: Rate ~110 and regular. No extremity edema.   Abdomen: No distension, no tenderness, soft. Bowel sounds normal.  Musculoskeletal: no clubbing / cyanosis. No joint deformity upper and lower extremities.    Skin: Induration, tenderness, and erythema involving proximal right arm. Warm, dry, well-perfused. Neurologic: CN 2-12 grossly intact. Sensation intact. Strength 5/5 in all 4 limbs.  Psychiatric: Alert and oriented x 3. Calm, cooperative.    Labs on Admission: I have personally reviewed following labs and imaging studies  CBC: Recent Labs  Lab 03/03/18 1629  WBC 23.4*  NEUTROABS 20.9*  HGB 11.4*  HCT 35.5*  MCV 69.9*  PLT 344   Basic Metabolic Panel: Recent Labs  Lab 03/03/18 1629  NA 136  K 3.9  CL 105  CO2 23  GLUCOSE 111*  BUN 10  CREATININE 0.59  CALCIUM  8.6*   GFR: Estimated Creatinine Clearance: 136.2 mL/min (by C-G formula based on SCr of 0.59 mg/dL). Liver Function Tests: Recent Labs  Lab 03/03/18 1629  AST 17  ALT 19  ALKPHOS 58  BILITOT 0.9  PROT 8.5*  ALBUMIN 3.5   Recent Labs  Lab 03/03/18 1629  LIPASE 23   No results for input(s): AMMONIA in the last 168 hours. Coagulation Profile: No results for input(s): INR, PROTIME in the last 168 hours. Cardiac Enzymes: Recent Labs  Lab 03/03/18 1629  TROPONINI <0.03   BNP (last 3 results) No results for input(s): PROBNP in the last 8760 hours. HbA1C: No results for input(s): HGBA1C in the last 72 hours. CBG: No results for input(s): GLUCAP in the last 168 hours. Lipid Profile: No results for input(s): CHOL, HDL, LDLCALC, TRIG, CHOLHDL, LDLDIRECT in the last 72 hours. Thyroid Function Tests: No results for input(s): TSH, T4TOTAL, FREET4, T3FREE, THYROIDAB in the last 72 hours. Anemia Panel: No results for input(s): VITAMINB12, FOLATE, FERRITIN, TIBC, IRON, RETICCTPCT in the last 72 hours. Urine analysis:    Component Value Date/Time   COLORURINE YELLOW 03/03/2018 1734   APPEARANCEUR HAZY (A) 03/03/2018 1734   LABSPEC 1.020 03/03/2018 1734   PHURINE 8.0 03/03/2018 1734   GLUCOSEU NEGATIVE 03/03/2018 1734   HGBUR MODERATE (A) 03/03/2018 1734   BILIRUBINUR NEGATIVE 03/03/2018 1734   KETONESUR NEGATIVE 03/03/2018 1734   PROTEINUR 30 (A) 03/03/2018 1734   UROBILINOGEN 0.2 09/17/2009 1833   NITRITE NEGATIVE 03/03/2018 1734   LEUKOCYTESUR TRACE (A) 03/03/2018 1734   Sepsis Labs: @LABRCNTIP (procalcitonin:4,lacticidven:4) ) Recent Results (from the past 240 hour(s))  Culture, blood (Routine x 2)     Status: None (Preliminary result)   Collection Time: 03/03/18  4:30 PM  Result Value Ref Range Status   Specimen Description RIGHT ANTECUBITAL  Final   Special Requests   Final    BOTTLES DRAWN AEROBIC AND ANAEROBIC Blood Culture adequate volume Performed at [facility], [address], [place], [place] [address]    Culture PENDING  Incomplete   Report Status PENDING  Incomplete  Radiological Exams on Admission: Dg Chest 2 View  Result Date: 03/03/2018 CLINICAL DATA:  Chest pain.  Fever. EXAM: CHEST - 2 VIEW COMPARISON:  May 29, 2009 FINDINGS: Stable right Port-A-Cath. The cardiomediastinal silhouette is unchanged. No pulmonary nodules or masses. No focal infiltrates. IMPRESSION: No active cardiopulmonary disease. Electronically Signed   By: Gerome Sam III M.D   On: 03/03/2018 17:29   Ct Angio Chest Pe W And/or Wo Contrast  Result Date: 03/03/2018 CLINICAL DATA:  Central chest pain EXAM: CT ANGIOGRAPHY CHEST WITH CONTRAST TECHNIQUE: Multidetector CT imaging of the chest was performed using the standard protocol during bolus administration of intravenous contrast. Multiplanar CT image reconstructions and MIPs were obtained to evaluate the vascular anatomy. CONTRAST:  ISOVUE-370 IOPAMIDOL (ISOVUE-370) INJECTION 76% COMPARISON:  Chest x-ray from earlier in the same day. FINDINGS: Cardiovascular: Thoracic aorta shows a normal branching pattern without aneurysmal dilatation or dissection. The heart is within normal limits. No pericardial effusion is seen. The pulmonary artery shows a normal branching pattern without intraluminal filling defect to suggest pulmonary embolism. Mediastinum/Nodes: The esophagus is within normal limits. No hilar or mediastinal adenopathy is seen. The thoracic inlet demonstrates changes consistent with a multinodular goiter. Right chest wall port is noted. Lungs/Pleura: Lungs are well aerated bilaterally. No focal infiltrate or sizable effusion is seen. No pneumothorax is noted. Minimal scarring is noted in the left base. Upper Abdomen: Visualized upper abdomen is within normal limits. Musculoskeletal: No chest wall abnormality. No acute or significant osseous findings. Review of the MIP images confirms the above findings.  IMPRESSION: No evidence of pulmonary emboli. Multinodular goiter. No acute abnormality noted. Electronically Signed   By: Alcide Clever M.D.   On: 03/03/2018 19:15    EKG: Independently reviewed. Sinus rhythm.   Assessment/Plan   1. Sepsis secondary to cellulitis  - Presents with fever/chills, malaise, and right arm pain with redness and swelling  - Reports she has been on Levaquin and Flagyl, as well as methotrexate and infliximab for hidradenitis  - Found to have fever, tachycardia, and marked leukocytosis  - Source is likely right arm cellulitis; no abscess or fluid-collection noted on CTA chest  - Blood cultures collected, 2 liters NS given, and empiric vancomycin and Zosyn started in ED  - Continue Zosyn and vancomycin, follow cultures and clinical course   2. Hidradenitis suppurativa  - Status-post surgery on right axilla in August  - Follows with dermatology and managed with methotrexate and infliximab, reports recently starting Levaquin and Flagyl   - She will continue dermatology follow-up   3. Asthma  - No wheezing, cough, or SOB on admission  - Continue Singulair and as-needed albuterol    4. Iron-deficiency anemia  - Hgb is stable, no active bleeding  - She will continue iron-supplementation    5. Chest pain  - No known hx of CAD  - Troponin undetectable, EKG without acute ischemic changes, and CTA neg for PE or other acute cardiopulmonary disease  - Continue cardiac monitoring for now and repeat troponin    DVT prophylaxis: Lovenox Code Status: Full  Family Communication: Family updated at bedside   Consults called: None  Admission status: Inpatient    Briscoe Deutscher, MD [facility] Pager [phone]  If 7PM-7AM, please contact night-coverage [email or web address] Password [facility]  03/03/2018, 8:27 PM

## 2018-03-03 NOTE — ED Notes (Signed)
Pt taken to xray 

## 2018-03-04 DIAGNOSIS — E119 Type 2 diabetes mellitus without complications: Secondary | ICD-10-CM

## 2018-03-04 DIAGNOSIS — L732 Hidradenitis suppurativa: Secondary | ICD-10-CM

## 2018-03-04 DIAGNOSIS — A419 Sepsis, unspecified organism: Principal | ICD-10-CM

## 2018-03-04 DIAGNOSIS — L0390 Cellulitis, unspecified: Secondary | ICD-10-CM

## 2018-03-04 LAB — CBC WITH DIFFERENTIAL/PLATELET
Abs Immature Granulocytes: 0.08 10*3/uL — ABNORMAL HIGH (ref 0.00–0.07)
BASOS ABS: 0 10*3/uL (ref 0.0–0.1)
Basophils Relative: 0 %
EOS ABS: 0.1 10*3/uL (ref 0.0–0.5)
EOS PCT: 0 %
HEMATOCRIT: 29.2 % — AB (ref 36.0–46.0)
Hemoglobin: 9.5 g/dL — ABNORMAL LOW (ref 12.0–15.0)
Immature Granulocytes: 1 %
LYMPHS ABS: 2.4 10*3/uL (ref 0.7–4.0)
Lymphocytes Relative: 15 %
MCH: 22.7 pg — ABNORMAL LOW (ref 26.0–34.0)
MCHC: 32.5 g/dL (ref 30.0–36.0)
MCV: 69.9 fL — AB (ref 80.0–100.0)
MONO ABS: 1.1 10*3/uL — AB (ref 0.1–1.0)
MONOS PCT: 6 %
NRBC: 0 % (ref 0.0–0.2)
Neutro Abs: 12.9 10*3/uL — ABNORMAL HIGH (ref 1.7–7.7)
Neutrophils Relative %: 78 %
Platelets: 287 10*3/uL (ref 150–400)
RBC: 4.18 MIL/uL (ref 3.87–5.11)
RDW: 18.9 % — AB (ref 11.5–15.5)
WBC: 16.5 10*3/uL — ABNORMAL HIGH (ref 4.0–10.5)

## 2018-03-04 LAB — BASIC METABOLIC PANEL
Anion gap: 5 (ref 5–15)
BUN: 9 mg/dL (ref 6–20)
CALCIUM: 7.9 mg/dL — AB (ref 8.9–10.3)
CHLORIDE: 105 mmol/L (ref 98–111)
CO2: 25 mmol/L (ref 22–32)
CREATININE: 0.63 mg/dL (ref 0.44–1.00)
GFR calc Af Amer: >60 mL/min (ref >60)
GFR calc non Af Amer: >60 mL/min (ref >60)
GLUCOSE: 112 mg/dL — AB (ref 70–99)
Potassium: 3.6 mmol/L (ref 3.5–5.1)
Sodium: 135 mmol/L (ref 135–145)

## 2018-03-04 LAB — TROPONIN I: Troponin I: <0.03 ng/mL (ref <0.03)

## 2018-03-04 LAB — MRSA PCR SCREENING: MRSA BY PCR: NEGATIVE

## 2018-03-04 LAB — GLUCOSE, CAPILLARY: Glucose-Capillary: 85 mg/dL (ref 70–99)

## 2018-03-04 MED ORDER — DIPHENHYDRAMINE HCL 50 MG/ML IJ SOLN
25.0000 mg | Freq: Four times a day (QID) | INTRAMUSCULAR | Status: DC | PRN
  Administered 2018-03-04 – 2018-03-05 (×2): 50 mg via INTRAVENOUS
  Filled 2018-03-04 (×3): qty 1

## 2018-03-04 MED ORDER — SODIUM CHLORIDE 0.45 % IV SOLN
INTRAVENOUS | Status: DC
  Administered 2018-03-04 – 2018-03-05 (×2): via INTRAVENOUS

## 2018-03-04 MED ORDER — LORATADINE 10 MG PO TABS
10.0000 mg | ORAL_TABLET | Freq: Every day | ORAL | Status: DC
  Administered 2018-03-04 – 2018-03-05 (×2): 10 mg via ORAL
  Filled 2018-03-04 (×2): qty 1

## 2018-03-04 MED ORDER — HYDROXYZINE HCL 25 MG PO TABS
25.0000 mg | ORAL_TABLET | Freq: Four times a day (QID) | ORAL | Status: DC | PRN
  Administered 2018-03-04: 25 mg via ORAL
  Filled 2018-03-04: qty 1

## 2018-03-04 MED ORDER — VANCOMYCIN HCL IN DEXTROSE 1-5 GM/200ML-% IV SOLN
1000.0000 mg | Freq: Three times a day (TID) | INTRAVENOUS | Status: DC
  Administered 2018-03-04 – 2018-03-05 (×4): 1000 mg via INTRAVENOUS
  Filled 2018-03-04 (×3): qty 200

## 2018-03-04 MED ORDER — DIPHENHYDRAMINE HCL 50 MG/ML IJ SOLN: 25.0000 mg | Freq: Four times a day (QID) | INTRAMUSCULAR | Status: DC | PRN

## 2018-03-04 NOTE — Progress Notes (Signed)
Has changed dressing today and will again tonight.  Areas of redness on right arm and now one on left arm and c/o itching in these areas.  Requested benadryl.  Sent message to Dr. Arta Silence

## 2018-03-04 NOTE — Progress Notes (Addendum)
[facility] Progress Note  Subjective: fevers better, some new areas redness on the R arm, R arm still hurting below the elbow and just above, no joint c/o's   Vitals:   03/03/18 2108 03/03/18 2109 03/04/18 0542 03/04/18 0951  BP: 132/87 136/90 120/79   Pulse: 99 92 82   Resp: 18 18 20    Temp: 98.6 F (37 C)  98.8 F (37.1 C)   TempSrc: Oral  Oral   SpO2: 98% 98% 97% 96%  Weight: 135.2 kg     Height: 5\' 7"  (1.702 m)       Inpatient medications: . citalopram  20 mg Oral Daily  . enoxaparin (LOVENOX) injection  70 mg Subcutaneous Q24H  . folic acid  3 mg Oral Daily  . levonorgestrel-ethinyl estradiol  1 tablet Oral QHS  . montelukast  10 mg Oral QHS  . pantoprazole  40 mg Oral BID AC  . sodium chloride flush  3 mL Intravenous Q12H   . piperacillin-tazobactam (ZOSYN)  IV 3.375 g (03/04/18 1610)  . vancomycin 1,000 mg (03/04/18 9604)   acetaminophen **OR** acetaminophen, albuterol, HYDROcodone-acetaminophen, morphine injection, ondansetron **OR** ondansetron (ZOFRAN) IV, senna-docusate  Exam: Constitutional: NAD, calm  Neck: normal, supple, no masses, no thyromegaly Respiratory: clear to auscultation bilaterally, no wheezing, no crackles. Normal respiratory effort.   R axilla shows nearly totally healed over post surgical wound, no tenderness, erythema or induration Cardiovascular: Rate ~110 and regular. No extremity edema.   Abdomen: No distension, no tenderness, soft. Bowel sounds normal.  Musculoskeletal: no clubbing / cyanosis. No joint deformity upper and lower extremities.    Skin: splotchy and mildly tender areas of erythema scattered over the R forearm and just above R elbow on post side, erythematous areas are superficial and not indurated or fluctuant  CN 2-12 grossly intact. Sensation intact. Strength 5/5 in all 4 limbs.  Psychiatric: Alert and oriented x 3. Calm, cooperative.       Presentation Summary: Sonya Olson is a 37 y.o. female with medical  history significant for obesity (BMI 47), hidradenitis suprativa, PCOS, OSA on CPAP, depression, iron deficiency anemia, and asthma, now presenting to the emergency department for evaluation of fevers, chills, malaise, chest pain, upper back pain, and right arm pain with swelling and redness.  Patient reports that she been in her usual state of health until developing a general malaise and fatigue yesterday.  Today she developed fevers, chills and noted pain throughout the right arm, particularly the upper arm, with associated redness and swelling.  She also went on to develop pain in the mid chest and upper back.  She had surgery in the right axilla in August for her early stage III hidradenitis, reports chronic drainage from groin and buttock lesions that does not seem to be worse than usual currently.  She denies any abscess or drainage from the right arm at this time.  The chest pain was localized to the central chest with no alleviating or exacerbating factors identified, and without any significant shortness of breath, cough, or pain or swelling in the lower extremities.  She has been managed with methotrexate and infliximab for her hidradenitis and reports recently being started on Levaquin and Flagyl. ED Course: Upon arrival to the ED, patient is found to be febrile to 38.1 C, tachycardic to 120, and with stable blood pressure.  EKG features sinus tachycardia with rate 121 and chest x-ray is negative for acute cardiopulmonary disease.  CTA chest is negative for PE or any  other acute findings.  Chemistry panel is unremarkable and CBC is notable for leukocytosis to 23,400 and a chronic microcytic anemia.  Lactic acid is reassuringly normal and troponin is undetectable.  Blood cultures were collected and the patient was given 2 L normal saline, acetaminophen, Toradol, morphine, vancomycin, and Zosyn in the ED.  Chest pain is resolved, tachycardia has improved, blood pressure remained stable, and the patient  will be admitted for ongoing evaluation and management of sepsis secondary to cellulitis.       Hospital Problems/ Course:  1. Sepsis / R arm cellulitis / hx hidradenitis suppurativa - presented with fever/chills, malaise,^^WBC and right forearm/ antecubital pain with patchy areas of redness and swelling; this is not close to any of her usual HS sites of infection and also does not appear to be associated directly w/ the post-op R axillary wound which is healing well - Blood cx's negative to date, WBC/ fevers improving on IV abx  - cont IV abx - get MRSA screen  2. Hidradenitis suppurativa / recent R axillary surg for HS 11/2017 - Status-post surgery on right axilla in August  - followed by [facility] derm MD (Dr Janyth Contes) on chronic Rx w/ methotrexate 15mg / week and infliximab IV every 8 wks  3. Asthma  - No wheezing, cough, or SOB on admission  - Continue Singulair and as-needed albuterol    4. Iron-deficiency anemia  - Hgb is stable, no active bleeding  - She will continue iron-supplementation    5. Chest pain  - No known hx of CAD  - Troponin undetectable, EKG without acute ischemic changes, and CTA negative   DVT prophylaxis: Lovenox Code Status: Full  Family Communication: Family updated at bedside   Consults called: None  Admission status: Inpatient      Vinson Moselle MD [facility] pgr [phone] 03/04/2018, 12:43 PM   Recent Labs  Lab 03/03/18 1629 03/04/18 0527  NA 136 135  K 3.9 3.6  CL 105 105  CO2 23 25  GLUCOSE 111* 112*  BUN 10 9  CREATININE 0.59 0.63  CALCIUM 8.6* 7.9*   Recent Labs  Lab 03/03/18 1629  AST 17  ALT 19  ALKPHOS 58  BILITOT 0.9  PROT 8.5*  ALBUMIN 3.5   Recent Labs  Lab 03/03/18 1629 03/04/18 0527  WBC 23.4* 16.5*  NEUTROABS 20.9* 12.9*  HGB 11.4* 9.5*  HCT 35.5* 29.2*  MCV 69.9* 69.9*  PLT 344 287   Iron/TIBC/Ferritin/ %Sat No results found for: IRON, TIBC, FERRITIN, IRONPCTSAT

## 2018-03-05 DIAGNOSIS — L03113 Cellulitis of right upper limb: Secondary | ICD-10-CM | POA: Diagnosis present

## 2018-03-05 DIAGNOSIS — D508 Other iron deficiency anemias: Secondary | ICD-10-CM

## 2018-03-05 DIAGNOSIS — F4321 Adjustment disorder with depressed mood: Secondary | ICD-10-CM

## 2018-03-05 LAB — CBC
HCT: 29.8 % — ABNORMAL LOW (ref 36.0–46.0)
HEMOGLOBIN: 9.6 g/dL — AB (ref 12.0–15.0)
MCH: 22.6 pg — AB (ref 26.0–34.0)
MCHC: 32.2 g/dL (ref 30.0–36.0)
MCV: 70.1 fL — AB (ref 80.0–100.0)
NRBC: 0 % (ref 0.0–0.2)
PLATELETS: 296 10*3/uL (ref 150–400)
RBC: 4.25 MIL/uL (ref 3.87–5.11)
RDW: 19 % — ABNORMAL HIGH (ref 11.5–15.5)
WBC: 10.3 10*3/uL (ref 4.0–10.5)

## 2018-03-05 LAB — BASIC METABOLIC PANEL
ANION GAP: 6 (ref 5–15)
BUN: 7 mg/dL (ref 6–20)
CHLORIDE: 106 mmol/L (ref 98–111)
CO2: 25 mmol/L (ref 22–32)
Calcium: 8.2 mg/dL — ABNORMAL LOW (ref 8.9–10.3)
Creatinine, Ser: 0.64 mg/dL (ref 0.44–1.00)
GFR calc Af Amer: >60 mL/min (ref >60)
Glucose, Bld: 98 mg/dL (ref 70–99)
POTASSIUM: 3.9 mmol/L (ref 3.5–5.1)
SODIUM: 137 mmol/L (ref 135–145)

## 2018-03-05 LAB — HIV ANTIBODY (ROUTINE TESTING W REFLEX): HIV SCREEN 4TH GENERATION: NONREACTIVE

## 2018-03-05 LAB — VANCOMYCIN, TROUGH: VANCOMYCIN TR: 12 ug/mL — AB (ref 15–20)

## 2018-03-05 LAB — GLUCOSE, CAPILLARY: Glucose-Capillary: 96 mg/dL (ref 70–99)

## 2018-03-05 MED ORDER — HEPARIN SOD (PORK) LOCK FLUSH 100 UNIT/ML IV SOLN
500.0000 [IU] | Freq: Once | INTRAVENOUS | Status: DC
  Filled 2018-03-05: qty 5

## 2018-03-05 MED ORDER — FLUCONAZOLE 100 MG PO TABS: 100.0000 mg | ORAL_TABLET | Freq: Every day | ORAL | 0 refills | Status: AC | PRN

## 2018-03-05 MED ORDER — SULFAMETHOXAZOLE-TRIMETHOPRIM 800-160 MG PO TABS: 1.0000 | ORAL_TABLET | Freq: Two times a day (BID) | ORAL | Status: DC

## 2018-03-05 MED ORDER — SULFAMETHOXAZOLE-TRIMETHOPRIM 800-160 MG PO TABS: 1.0000 | ORAL_TABLET | Freq: Two times a day (BID) | ORAL | 0 refills | Status: AC

## 2018-03-05 MED ORDER — HYDROCODONE-ACETAMINOPHEN 5-325 MG PO TABS: 1.0000 | ORAL_TABLET | ORAL | 0 refills | Status: DC | PRN

## 2018-03-05 NOTE — Discharge Summary (Signed)
Physician Discharge Summary  Patient ID: [name] MRN: [id number] DOB/AGE: [birth year] 37 y.o.  Admit date: 03/03/2018 Discharge date: 03/05/2018  Admission Diagnoses:   Cellulitis of right arm   Hydradenitis   Mild persistent asthma   Situational depression   Iron deficiency anemia    Discharge Diagnoses:    Cellulitis of right arm   Hydradenitis   Mild persistent asthma   Situational depression   Iron deficiency anemia   Discharged Condition: good  Presentation Summary: Sonya Olson a 37 y.o.femalewith medical history significant forobesity (BMI 47), hidradenitis suprativa, PCOS, OSA on CPAP, depression, iron deficiency anemia, and asthma, now presenting to the emergency department for evaluation of fevers, chills, malaise, chest pain, upper back pain, and right arm pain with swelling and redness. Patient reports that she been in her usual state of health until developing a general malaise and fatigue yesterday. Today she developed fevers, chills and noted pain throughout the right arm, particularly the upper arm, with associated redness and swelling. She also went on to develop pain in the mid chest and upper back. She had surgery in the right axilla in August for her early stage III hidradenitis, reports chronic drainage from groin and buttock lesions that does not seem to be worse than usual currently. She denies any abscess or drainage from the right arm at this time. The chest pain was localized to the central chest with no alleviating or exacerbating factors identified, and without any significant shortness of breath, cough, or pain or swelling in the lower extremities. She has been managed with methotrexate and infliximab for her hidradenitis and reports recently being started on Levaquin and Flagyl. ED Course:Upon arrival to the ED, patient is found to be febrile to 38.1C,tachycardic to 120, and with stable blood pressure. EKG features sinus tachycardia  with rate 121 and chest x-ray is negative for acute cardiopulmonary disease. CTA chest is negative for PE or any other acute findings. Chemistry panel is unremarkable and CBC is notable for leukocytosis to 23,400 and a chronic microcytic anemia. Lactic acid is reassuringly normal and troponin is undetectable. Blood cultures were collected and the patient was given 2 L normal saline, acetaminophen, Toradol, morphine, vancomycin, and Zosyn in the ED. Chest pain is resolved, tachycardia has improved, blood pressure remained stable, and the patient will be admitted for ongoing evaluation and management of sepsis secondary to cellulitis.   Hospital Course:   1.Sepsis / R lower arm cellulitis -presented with fever/chills, malaise,^^WBC and right forearm/ antecubital pain with patchy areas of redness and swelling; this was not involving her R axilla wound and was limited to the forearm and just above the elbow.  - pt admitted and rec'd 3 days IV abx (vanc/ zosyn), on 3rd day fevers/ WBC have normalized (WBC 23k > 16> 10k on abx) and pain / redness of R arm is resolving, VS back to normal. Plan on DC home w/ 7 days po Bactrim for total 10day abx course.  Pt will f/u w/ her dermatologist at [facility].  MRSA screen/ swab was negative.   2.Hidradenitis suppurativa/ recent R axillary surg for HS 11/2017 -Status-post surgery on right axilla in August  - followed by [facility] derm MD (Dr Janyth Contes) on chronic Rx w/ methotrexate 15mg / week and infliximab IV every 8 wks  3.Asthma -No wheezing, cough, or SOB on admission -Continue Singulair and as-needed albuterol  4.Iron-deficiency anemia -Hgb is stable, no active bleeding -She will continue iron-supplementation   5. Chest pain  - No  known hx of CAD  - Troponin undetectable, EKG without acute ischemic changes, and CTA chest on admit was negative for PE     Treatments: antibiotics: vancomycin and zosyn   Discharge Exam: Blood pressure  134/78, pulse 75, temperature 98.5 F (36.9 C), temperature source Oral, resp. rate 16, height 5\' 7"  (1.702 m), weight 135.2 kg, last menstrual period 11/28/2017, SpO2 100 %. Constitutional:NAD, calm  Neck:normal, supple, no masses, no thyromegaly Respiratory:clear to auscultation bilaterally, no wheezing, no crackles. Normal respiratory effort.  R axilla shows nearly totally healed over post surgical wound, no tenderness, erythema or induration Cardiovascular:Rate ~110 and regular. No extremity edema.  Abdomen:No distension, no tenderness, soft. Bowel sounds normal.  Musculoskeletal:no clubbing / cyanosis. No joint deformity upper and lower extremities.  Skin:splotchy and mildly tender areas of erythema scattered over the R forearm and just above R elbow on post side, erythematous areas are superficial and not indurated or fluctuant , resolving on discharge  CN 2-12 grossly intact. Sensation intact. Strength 5/5 in all 4 limbs.  Psychiatric:Alert and oriented x 3. Calm, cooperative.   Disposition: Discharge disposition: 01-Home or Self Care        Allergies as of 03/05/2018      Reactions   Bee Pollen    Other    Grass, trees, pet dander, dust   Strawberry Extract    Golimumab Itching      Medication List    TAKE these medications   acetaminophen 325 MG tablet Commonly known as:  TYLENOL Take 650 mg by mouth every 6 (six) hours as needed for mild pain or headache.   albuterol 108 (90 Base) MCG/ACT inhaler Commonly known as:  PROVENTIL HFA;VENTOLIN HFA Inhale 1-2 puffs into the lungs every 6 (six) hours as needed for wheezing or shortness of breath.   desonide 0.05 % cream Commonly known as:  DESOWEN Apply 1 application topically daily as needed (rash).   escitalopram 5 MG tablet Commonly known as:  LEXAPRO Take 1.5 tablets (7.5 mg total) by mouth at bedtime.   ferrous sulfate 325 (65 FE) MG tablet Take 1 tablet (325 mg total) by mouth daily with  breakfast.   fluconazole 100 MG tablet Commonly known as:  DIFLUCAN Take 1 tablet (100 mg total) by mouth daily as needed for up to 3 doses (for yeast infection).   fluocinonide cream 0.05 % Commonly known as:  LIDEX Apply 1 application topically daily.   fluticasone 50 MCG/ACT nasal spray Commonly known as:  FLONASE Place 1 spray into both nostrils 2 (two) times daily as needed for allergies or rhinitis.   folic acid 1 MG tablet Commonly known as:  FOLVITE Take 3 tablets by mouth daily. Do not take with Methotrexate   HYDROcodone-acetaminophen 5-325 MG tablet Commonly known as:  NORCO/VICODIN Take 1 tablet by mouth every 4 (four) hours as needed for moderate pain.   inFLIXimab in sodium chloride 0.9 % Inject 5 mg/kg into the vein every 8 (eight) weeks.   levocetirizine 5 MG tablet Commonly known as:  XYZAL Take 1 tablet (5 mg total) by mouth every evening.   levonorgestrel-ethinyl estradiol 0.1-20 MG-MCG tablet Commonly known as:  AVIANE,ALESSE,LESSINA Take 1 tablet by mouth.   Liraglutide -Weight Management 18 MG/3ML Sopn Inject 0.6 mg into the skin daily. Increase to max of 3mg  daily What changed:  how much to take   Liraglutide -Weight Management 18 MG/3ML Sopn Inject 0.6 mg into the skin daily. 0.6 mg daily for 1 week, 1.2 mg  daily for 1 week, 1.8 mg daily for 1 week, 2.4 mg for 1 week and then 3 mg daily What changed:  Another medication with the same name was changed. Make sure you understand how and when to take each.   loratadine 10 MG tablet Commonly known as:  CLARITIN Take 10 mg by mouth daily.   metFORMIN 500 MG (MOD) 24 hr tablet Commonly known as:  GLUMETZA Take 1 tablet (500 mg total) by mouth daily with breakfast.   methotrexate 2.5 MG tablet Commonly known as:  RHEUMATREX Take 6 tablets by mouth once a week. Takes on Saturdays   montelukast 10 MG tablet Commonly known as:  SINGULAIR Take 10 mg by mouth at bedtime.   omeprazole 20 MG  capsule Commonly known as:  PRILOSEC Take 1 capsule (20 mg total) by mouth 2 (two) times daily before a meal.   sulfamethoxazole-trimethoprim 800-160 MG tablet Commonly known as:  BACTRIM DS,SEPTRA DS Take 1 tablet by mouth every 12 (twelve) hours for 7 days.        Signed: Barbette Hair Pearlie Nies 03/05/2018, 12:14 PM

## 2018-03-05 NOTE — Progress Notes (Signed)
  Progress Note   Date: 03/05/2018  Patient Name: Sonya Olson        MRN#: [id number]   Clarification of diagnosis:   Morbid/ severe obesity - BMI > 40.0    Maree Krabbe, MD

## 2018-03-05 NOTE — Progress Notes (Signed)
Port hep locked and deaccessed.  Discharge instructions reviewed and scripts given.  Husband at bedside and to drive home.  Declined wheelchair and walked to elevator with husband to go home

## 2018-03-05 NOTE — Progress Notes (Signed)
Patient expressed concern for spiritual support and prayer. Family is local but was not present during visit. Patient thanks me for visit and said "it was good to talk."

## 2018-03-08 LAB — CULTURE, BLOOD (ROUTINE X 2)
CULTURE: NO GROWTH
CULTURE: NO GROWTH
SPECIAL REQUESTS: ADEQUATE
Special Requests: ADEQUATE

## 2018-03-27 ENCOUNTER — Other Ambulatory Visit: Payer: Self-pay | Admitting: Family Medicine

## 2018-04-17 ENCOUNTER — Encounter: Payer: Self-pay | Admitting: Family Medicine

## 2018-04-17 ENCOUNTER — Ambulatory Visit: Payer: BC Managed Care – PPO | Admitting: Family Medicine

## 2018-04-17 ENCOUNTER — Other Ambulatory Visit: Payer: Self-pay

## 2018-04-17 VITALS — BP 122/60 | HR 80 | Temp 98.9°F | Resp 18 | Ht 66.0 in | Wt 299.0 lb

## 2018-04-17 DIAGNOSIS — J0100 Acute maxillary sinusitis, unspecified: Secondary | ICD-10-CM

## 2018-04-17 DIAGNOSIS — J209 Acute bronchitis, unspecified: Secondary | ICD-10-CM

## 2018-04-17 DIAGNOSIS — J040 Acute laryngitis: Secondary | ICD-10-CM

## 2018-04-17 MED ORDER — PREDNISONE 20 MG PO TABS: 40.0000 mg | ORAL_TABLET | Freq: Every day | ORAL | 0 refills | Status: DC

## 2018-04-17 MED ORDER — AMOXICILLIN 875 MG PO TABS: 875.0000 mg | ORAL_TABLET | Freq: Two times a day (BID) | ORAL | 0 refills | Status: DC

## 2018-04-17 MED ORDER — HYDROCOD POLST-CPM POLST ER 10-8 MG/5ML PO SUER: 5.0000 mL | Freq: Two times a day (BID) | ORAL | 0 refills | Status: DC | PRN

## 2018-04-17 NOTE — Assessment & Plan Note (Signed)
Off weight loss meds planning for gastric sleeve in 2 weeks

## 2018-04-17 NOTE — Patient Instructions (Signed)
Take antibiotics Steroids Cough medicine  F/U 4 months

## 2018-04-17 NOTE — Progress Notes (Signed)
   Subjective:    Patient ID: Sonya Olson, female    DOB: 03/29/1981, 37 y.o.   MRN: [id number]  Patient presents for Follow-up (is not fasting) and Illness (x1 week- horase, productive cough with yellow colored mucus-denies fever)   Pt here with cough an congestion, sinus pressure and pain for past week or so. No fever. She has had some wheezing, has ASTHMA, using albuterol.   She also has hoarse voice. Sick contacts with family members early on She is due to have bariatric surgery in 2 weeks, will then proceed with left arm and neck hidradenitis surgery in January.  Off Metformin per surgeons orders for her gastric sleeve Saxenda stopped by dermatology   Review Of Systems:  GEN- denies fatigue, fever, weight loss,weakness, recent illness HEENT- denies eye drainage, change in vision+, nasal discharge, CVS- denies chest pain, palpitations RESP- denies SOB, +cough,+ wheeze ABD- denies N/V, change in stools, abd pain GU- denies dysuria, hematuria, dribbling, incontinence MSK- denies joint pain, muscle aches, injury Neuro- + headache, denies dizziness, syncope, seizure activity       Objective:    BP 122/60   Pulse 80   Temp 98.9 F (37.2 C) (Oral)   Resp 18   Ht 5\' 6"  (1.676 m)   Wt 299 lb (135.6 kg)   SpO2 98%   BMI 48.26 kg/m  GEN- NAD, alert and oriented x3 HEENT- PERRL, EOMI, non injected sclera, pink conjunctiva, MMM, oropharynx mild injection, TM clear bilat no effusion, hoarse voice + maxillary sinus tenderness, inflammed turbinates,  Nasal drainage  Neck- Supple, no LAD CVS- RRR, no murmur RESP-CTAB EXT- No edema Pulses- Radial 2+          Assessment & Plan:      Problem List Items Addressed This Visit      Unprioritized   Severe obesity (BMI >= 40) (HCC)    Off weight loss meds planning for gastric sleeve in 2 weeks       Other Visit Diagnoses    Acute maxillary sinusitis, recurrence not specified    -  Primary   Treat amoxicillin, nasal  sprays, tussionex for cough, prednisone for bronchitis with underlying asthma   Relevant Medications   predniSONE (DELTASONE) 20 MG tablet   chlorpheniramine-HYDROcodone (TUSSIONEX PENNKINETIC ER) 10-8 MG/5ML SUER   amoxicillin (AMOXIL) 875 MG tablet   Laryngitis       Acute bronchitis, unspecified organism          Note: This dictation was prepared with Dragon dictation along with smaller phrase technology. Any transcriptional errors that result from this process are unintentional.

## 2018-07-17 ENCOUNTER — Other Ambulatory Visit: Payer: Self-pay | Admitting: Family Medicine

## 2018-08-16 ENCOUNTER — Other Ambulatory Visit: Payer: Self-pay | Admitting: Family Medicine

## 2018-08-16 ENCOUNTER — Ambulatory Visit: Payer: BC Managed Care – PPO | Admitting: Family Medicine

## 2018-08-19 ENCOUNTER — Ambulatory Visit: Payer: BC Managed Care – PPO | Admitting: Family Medicine

## 2018-09-16 ENCOUNTER — Other Ambulatory Visit: Payer: Self-pay | Admitting: Family Medicine

## 2018-09-18 ENCOUNTER — Other Ambulatory Visit: Payer: Self-pay

## 2018-09-18 ENCOUNTER — Encounter: Payer: Self-pay | Admitting: Family Medicine

## 2018-09-18 ENCOUNTER — Telehealth: Payer: Self-pay

## 2018-09-18 ENCOUNTER — Ambulatory Visit: Payer: BC Managed Care – PPO | Admitting: Family Medicine

## 2018-09-18 VITALS — BP 124/66 | HR 78 | Temp 98.8°F | Resp 14 | Ht 66.0 in | Wt 260.0 lb

## 2018-09-18 DIAGNOSIS — E282 Polycystic ovarian syndrome: Secondary | ICD-10-CM | POA: Diagnosis not present

## 2018-09-18 DIAGNOSIS — F418 Other specified anxiety disorders: Secondary | ICD-10-CM

## 2018-09-18 DIAGNOSIS — D508 Other iron deficiency anemias: Secondary | ICD-10-CM

## 2018-09-18 DIAGNOSIS — L732 Hidradenitis suppurativa: Secondary | ICD-10-CM

## 2018-09-18 DIAGNOSIS — Z13220 Encounter for screening for lipoid disorders: Secondary | ICD-10-CM

## 2018-09-18 DIAGNOSIS — E7439 Other disorders of intestinal carbohydrate absorption: Secondary | ICD-10-CM

## 2018-09-18 MED ORDER — HYDROCODONE-ACETAMINOPHEN 5-325 MG PO TABS: 1.0000 | ORAL_TABLET | Freq: Four times a day (QID) | ORAL | 0 refills | Status: DC | PRN

## 2018-09-18 MED ORDER — LORAZEPAM 0.5 MG PO TABS: 0.5000 mg | ORAL_TABLET | Freq: Three times a day (TID) | ORAL | 1 refills | Status: DC

## 2018-09-18 MED ORDER — FLUOXETINE HCL 20 MG PO TABS: 20.0000 mg | ORAL_TABLET | Freq: Every day | ORAL | 6 refills | Status: DC

## 2018-09-18 NOTE — Assessment & Plan Note (Signed)
Continue to work on dietary changes and weight loss 

## 2018-09-18 NOTE — Patient Instructions (Signed)
F/U 3 months  TELEPHONE visit in 4 weeks for meds

## 2018-09-18 NOTE — Telephone Encounter (Signed)
PA for Saxenda 18MG /3ML pen-injectors has been approved from 09/18/2018 - 09/18/2019.

## 2018-09-18 NOTE — Assessment & Plan Note (Signed)
Switch her over to Prozac 20 mg she is on a lower dose of the Lexapro only 7-1/2 mg she did have headaches when she tried to go up to 10 mg.  We will also bridge her with lorazepam to use to help with sleep.  We will follow-up in a few weeks to see how she is doing.  We will provide her with a documentation for work if she is indeed called back and she is in the high risk category for complications.

## 2018-09-18 NOTE — Telephone Encounter (Signed)
PA submitted via CMM for Saxenda 18MG/3ML pen-injectors. Waiting on reply.  

## 2018-09-18 NOTE — Assessment & Plan Note (Signed)
RECENT surgeries still healing in axilla, has home self  wound care Given Norco script for severe pain, no history of overuse of meds, understands not to use this with her lorazepam.

## 2018-09-18 NOTE — Progress Notes (Signed)
Subjective:    Patient ID: Sonya Olson, female    DOB: [birth year], 38 y.o.   MRN: [id number]  Patient presents for Follow-up (is fasting) and Medication Review (would like to increase Lexapro)  She has 3 hidradenitis surgeries since our last visit, bilat arms and neck   taking remicade another Hayden Rasmussen/MTX / Forde RadonSkyriz she is quite terrified of the COVID virus noted that she does have decreased immune system.  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  Though she is called back and she would like documentation so that she may continue to social distance within her home because of her high risk for contraction.  Her above she has had increased anxiety she is not sleeping very well she has been on Lexapro but her mother and her sister actually on Prozac and have done quite well with this.  She would like to try that medication instead.  She feels on edge all the time she has a cleaning ritual that she goes through with her husband the time he comes back into the home from working outside.   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   [email or web address]    Chronic pain -chronic pain from her healing hidradenitis surgeries in the axilla she also has it in her groin but they are unable to operate at this time in the setting of the pandemic.  She no longer has any pain medication to help with this.  She was on hydrocodone.  She continues to work on dietary changes and weight loss.  He has lost 40 pounds since her visit in December intentionally.  He is due for fasting labs she has history of glucose intolerance along with iron deficiency anemia/PCOS  Review Of Systems:  GEN- denies fatigue, fever, weight loss,weakness, recent illness HEENT- denies eye drainage, change in vision, nasal discharge, CVS- denies chest pain, palpitations RESP- denies SOB, cough, wheeze ABD- denies N/V, change in stools, abd pain GU- denies dysuria, hematuria,  dribbling, incontinence MSK- + joint pain, muscle aches, injury Neuro- denies headache, dizziness, syncope, seizure activity       Objective:    BP 124/66   Pulse 78   Temp 98.8 F (37.1 C) (Oral)   Resp 14   Ht 5\' 6"  (1.676 m)   Wt 260 lb (117.9 kg)   SpO2 97%   BMI 41.97 kg/m  GEN- NAD, alert and oriented x3 HEENT- PERRL, EOMI, non injected sclera, pink conjunctiva, MMM, oropharynx clear, Neck- Supple, no thyromegaly,healed incision crease of post neck CVS- RRR, no murmur RESP-CTAB ABD-NABS,soft,NT,ND Psych- anxious appearing, no SI, well groomed, good eye contact, normal speech  EXT- No edema Pulses- Radial, DP- 2+  GAD 7 score 15, PHQ9 score 12       Assessment & Plan:      Problem List Items Addressed This Visit      Unprioritized   Depression with anxiety    Switch her over to Prozac 20 mg she is on a lower dose of the Lexapro only 7-1/2 mg she did have headaches when she tried to go up to 10 mg.  We will also bridge her with lorazepam to use to help with sleep.  We will follow-up in a few weeks to see how she is doing.  We will provide her with a documentation for work if she is indeed called back and she is in the high risk category for complications.  Relevant Medications   FLUoxetine (PROZAC) 20 MG tablet   LORazepam (ATIVAN) 0.5 MG tablet   Hydradenitis    RECENT surgeries still healing in axilla, has home self  wound care Given Norco script for severe pain, no history of overuse of meds, understands not to use this with her lorazepam.      Iron deficiency anemia   PCOS (polycystic ovarian syndrome) - Primary   Relevant Orders   CBC with Differential/Platelet   Comprehensive metabolic panel   Hemoglobin A1c   Severe obesity (BMI >= 40) (HCC)    Continue to work on dietary changes and weight loss.       Other Visit Diagnoses    Glucose intolerance       Relevant Orders   Hemoglobin A1c   Lipid screening       Relevant Orders   Lipid  panel      Note: This dictation was prepared with Dragon dictation along with smaller phrase technology. Any transcriptional errors that result from this process are unintentional.

## 2018-09-19 LAB — COMPREHENSIVE METABOLIC PANEL
AG Ratio: 1 (calc) (ref 1.0–2.5)
ALT: 15 U/L (ref 6–29)
AST: 14 U/L (ref 10–30)
Albumin: 3.8 g/dL (ref 3.6–5.1)
Alkaline phosphatase (APISO): 63 U/L (ref 31–125)
BUN: 10 mg/dL (ref 7–25)
CO2: 24 mmol/L (ref 20–32)
Calcium: 9 mg/dL (ref 8.6–10.2)
Chloride: 104 mmol/L (ref 98–110)
Creat: 0.75 mg/dL (ref 0.50–1.10)
Globulin: 4 g/dL (calc) — ABNORMAL HIGH (ref 1.9–3.7)
Glucose, Bld: 81 mg/dL (ref 65–99)
Potassium: 4.3 mmol/L (ref 3.5–5.3)
Sodium: 138 mmol/L (ref 135–146)
Total Bilirubin: 0.4 mg/dL (ref 0.2–1.2)
Total Protein: 7.8 g/dL (ref 6.1–8.1)

## 2018-09-19 LAB — CBC WITH DIFFERENTIAL/PLATELET
Absolute Monocytes: 487 cells/uL (ref 200–950)
Basophils Absolute: 52 cells/uL (ref 0–200)
Basophils Relative: 0.6 %
Eosinophils Absolute: 122 cells/uL (ref 15–500)
Eosinophils Relative: 1.4 %
HCT: 39 % (ref 35.0–45.0)
Hemoglobin: 13.1 g/dL (ref 11.7–15.5)
Lymphs Abs: 2888 cells/uL (ref 850–3900)
MCH: 27 pg (ref 27.0–33.0)
MCHC: 33.6 g/dL (ref 32.0–36.0)
MCV: 80.4 fL (ref 80.0–100.0)
MPV: 10.8 fL (ref 7.5–12.5)
Monocytes Relative: 5.6 %
Neutro Abs: 5150 cells/uL (ref 1500–7800)
Neutrophils Relative %: 59.2 %
Platelets: 347 10*3/uL (ref 140–400)
RBC: 4.85 10*6/uL (ref 3.80–5.10)
RDW: 14.9 % (ref 11.0–15.0)
Total Lymphocyte: 33.2 %
WBC: 8.7 10*3/uL (ref 3.8–10.8)

## 2018-09-19 LAB — LIPID PANEL
Cholesterol: 208 mg/dL — ABNORMAL HIGH (ref <200)
HDL: 51 mg/dL (ref >=50)
LDL Cholesterol (Calc): 139 mg/dL (calc) — ABNORMAL HIGH
Non-HDL Cholesterol (Calc): 157 mg/dL (calc) — ABNORMAL HIGH (ref <130)
Total CHOL/HDL Ratio: 4.1 (calc) (ref <5.0)
Triglycerides: 82 mg/dL (ref <150)

## 2018-09-19 LAB — HEMOGLOBIN A1C
Hgb A1c MFr Bld: 5.4 % of total Hgb (ref <5.7)
Mean Plasma Glucose: 108 (calc)
eAG (mmol/L): 6 (calc)

## 2018-10-02 ENCOUNTER — Telehealth: Payer: Self-pay | Admitting: Family Medicine

## 2018-10-02 NOTE — Telephone Encounter (Signed)
Received a fax from [facility] stating that patient's Sonya Olson is approved from 09/18/2018-08/2019 as long as she is covered under the [facility].

## 2018-10-16 ENCOUNTER — Ambulatory Visit (INDEPENDENT_AMBULATORY_CARE_PROVIDER_SITE_OTHER): Payer: BC Managed Care – PPO | Admitting: Family Medicine

## 2018-10-16 ENCOUNTER — Encounter: Payer: Self-pay | Admitting: Family Medicine

## 2018-10-16 ENCOUNTER — Other Ambulatory Visit: Payer: Self-pay

## 2018-10-16 DIAGNOSIS — F418 Other specified anxiety disorders: Secondary | ICD-10-CM

## 2018-10-16 MED ORDER — FLUOXETINE HCL 40 MG PO CAPS: 40.0000 mg | ORAL_CAPSULE | Freq: Every day | ORAL | 3 refills | Status: DC

## 2018-10-16 NOTE — Progress Notes (Signed)
Virtual Visit via Telephone Note  I connected with Sonya Olson on 10/16/18 at 12:08pm by telephone and verified that I am speaking with the correct person using two identifiers.      Pt location: at home   Physician location:  In office, [facility], Vic Blackbird MD     On call: patient and physician   I discussed the limitations, risks, security and privacy concerns of performing an evaluation and management service by telephone and the availability of in person appointments. I also discussed with the patient that there may be a patient responsible charge related to this service. The patient expressed understanding and agreed to proceed.   History of Present Illness: Telephone visit to follow-up depression anxiety medication.  At her last visit about 1 month ago she was transition from Lexapro to Prozac.  She is currently on Prozac 20 mg along with Lorazepam to use as a bridge to help with sleeping and anxiety.  She has had significant stress in his setting of because in the fact she is immunocompromise.  She was worrying for her family as well.  She is currently in psychotherapy her last visit was last week.  They are finding calming techniques for her and working on general stressors.  She does however feel like her Prozac dose could be adjusted up.  She has not had any side effects with the medication and does feel like it is helping.  NO SI, no recent panic attacks    Observations/Objective:  NAD, normal speech, answered questions appropriately  Assessment and Plan:  Depression with anxiety- increase prozac to 40mg  once a day, continue ativan as needed for sleep  continue with therapy, currently scheduled every 2 weeks  F/U via phone in 4 weeks   Follow Up Instructions: Telephone visit Wed July 15th at 12:30pm    I discussed the assessment and treatment plan with the patient. The patient was provided an opportunity to ask questions and all were answered. The  patient agreed with the plan and demonstrated an understanding of the instructions.   The patient was advised to call back or seek an in-person evaluation if the symptoms worsen or if the condition fails to improve as anticipated.  I provided 8 minutes of non-face-to-face time during this encounter. End Time:12:16pm   Vic Blackbird, MD

## 2018-10-28 ENCOUNTER — Other Ambulatory Visit: Payer: Self-pay | Admitting: Family Medicine

## 2018-11-13 ENCOUNTER — Encounter: Payer: Self-pay | Admitting: Family Medicine

## 2018-11-13 ENCOUNTER — Ambulatory Visit (INDEPENDENT_AMBULATORY_CARE_PROVIDER_SITE_OTHER): Payer: BC Managed Care – PPO | Admitting: Family Medicine

## 2018-11-13 ENCOUNTER — Other Ambulatory Visit: Payer: Self-pay

## 2018-11-13 DIAGNOSIS — F418 Other specified anxiety disorders: Secondary | ICD-10-CM | POA: Diagnosis not present

## 2018-11-13 MED ORDER — PANTOPRAZOLE SODIUM 40 MG PO TBEC: 40.0000 mg | DELAYED_RELEASE_TABLET | Freq: Every day | ORAL | 2 refills | Status: DC

## 2018-11-13 MED ORDER — LORAZEPAM 0.5 MG PO TABS: 0.5000 mg | ORAL_TABLET | Freq: Three times a day (TID) | ORAL | 1 refills | Status: DC

## 2018-11-13 MED ORDER — FLUOXETINE HCL 40 MG PO CAPS: 40.0000 mg | ORAL_CAPSULE | Freq: Every day | ORAL | 3 refills | Status: DC

## 2018-11-13 NOTE — Progress Notes (Signed)
Virtual Visit via Telephone Note  I connected with Sonya Olson on 11/13/18 at 12:57pm  by telephone and verified that I am speaking with the correct person using two identifiers.    Pt location: at home   Physician location:  In office, [facility], Vic Blackbird MD     On call: patient and physician     I discussed the limitations, risks, security and privacy concerns of performing an evaluation and management service by telephone and the availability of in person appointments. I also discussed with the patient that there may be a patient responsible charge related to this service. The patient expressed understanding and agreed to proceed.   History of Present Illness: Spoke with patient.  She is on her way back home from her second surgery and her hidradenitis.  Overall she is doing well.  She can tell a significant difference with the Prozac at 40 mg she does not feel like her anxiety is out of control or her depression.  He can tell that she is much calmer.  She is sleeping okay.  She still uses lorazepam as needed mostly at bedtime.  She has no new concerns at this time.  NO SE from medication  Observations/Objective: NAD noted on phone, normal speech   Assessment and Plan: MDD with anxiety-overall she is doing well on the higher dose of Prozac at 40 mg.  We will continue this along with the lorazepam.  She will follow-up in about 2 months.  Depending on the status COVID this may be by telephone. She will call if there is any concerns postsurgically as well.  Follow Up Instructions:    I discussed the assessment and treatment plan with the patient. The patient was provided an opportunity to ask questions and all were answered. The patient agreed with the plan and demonstrated an understanding of the instructions.   The patient was advised to call back or seek an in-person evaluation if the symptoms worsen or if the condition fails to improve as anticipated.  I  provided 5 minutes of non-face-to-face time during this encounter. End Time 1:02pm  Vic Blackbird, MD

## 2018-11-15 ENCOUNTER — Telehealth: Payer: Self-pay | Admitting: Family Medicine

## 2018-11-15 NOTE — Telephone Encounter (Signed)
We received a phone call from [facility] asking if you would be willing to sign off on [place] orders. Patient recently had surgery. May I give the ok?   Phone number for [facility]: [phone]

## 2018-11-15 NOTE — Telephone Encounter (Signed)
These needs to be signed Surgeon from [place] or [facility]

## 2018-11-15 NOTE — Telephone Encounter (Signed)
Spoke with Inez Catalina with [facility] and informed her that Dr. Buelah Manis would like the surgeon to be in charge of signing off on [place] orders. Inez Catalina verbalized understanding

## 2018-11-29 ENCOUNTER — Telehealth: Payer: Self-pay

## 2018-11-29 NOTE — Telephone Encounter (Signed)
Faxed form to [facility].

## 2018-12-20 ENCOUNTER — Other Ambulatory Visit: Payer: Self-pay

## 2018-12-23 ENCOUNTER — Ambulatory Visit: Payer: BC Managed Care – PPO | Admitting: Family Medicine

## 2018-12-23 ENCOUNTER — Other Ambulatory Visit: Payer: Self-pay

## 2018-12-23 ENCOUNTER — Encounter: Payer: Self-pay | Admitting: Family Medicine

## 2018-12-23 VITALS — BP 128/64 | HR 66 | Temp 99.5°F | Resp 12 | Ht 66.0 in | Wt 244.0 lb

## 2018-12-23 DIAGNOSIS — D508 Other iron deficiency anemias: Secondary | ICD-10-CM

## 2018-12-23 DIAGNOSIS — L732 Hidradenitis suppurativa: Secondary | ICD-10-CM

## 2018-12-23 DIAGNOSIS — E669 Obesity, unspecified: Secondary | ICD-10-CM | POA: Diagnosis not present

## 2018-12-23 DIAGNOSIS — F418 Other specified anxiety disorders: Secondary | ICD-10-CM | POA: Diagnosis not present

## 2018-12-23 DIAGNOSIS — E785 Hyperlipidemia, unspecified: Secondary | ICD-10-CM

## 2018-12-23 DIAGNOSIS — E282 Polycystic ovarian syndrome: Secondary | ICD-10-CM

## 2018-12-23 MED ORDER — FLUOXETINE HCL 40 MG PO CAPS: 40.0000 mg | ORAL_CAPSULE | Freq: Every day | ORAL | 3 refills | Status: DC

## 2018-12-23 MED ORDER — LORAZEPAM 0.5 MG PO TABS: 0.5000 mg | ORAL_TABLET | Freq: Three times a day (TID) | ORAL | 1 refills | Status: DC

## 2018-12-23 NOTE — Assessment & Plan Note (Signed)
Medications per her specialist, will refill pain meds as needed- norco

## 2018-12-23 NOTE — Patient Instructions (Signed)
F/U 4 months  

## 2018-12-23 NOTE — Assessment & Plan Note (Signed)
Continue MVI, had normal recent CBC last week per report

## 2018-12-23 NOTE — Progress Notes (Signed)
   Subjective:    Patient ID: Sonya Olson, female    DOB: 18-Nov-1980, 38 y.o.   MRN: [id number]  Patient presents for Follow-up (is fasting)  Patient here to follow-up chronic medical problems.  Medications reviewed.  He does have recent visit last month to follow-up her depression anxiety she is doing well with the Prozac and as needed lorazepam  she is still following with her therapist  She is still on medical leave from her Job  She is recently completed her third surgery for her hidradenitis overall doing well- recovery has been difficult with left groin surgery. She does need the right groin done.  Glucose intolerance she is due for repeat A1c she also has PCOS along with this and obesity Labs in May she had an A1c of 5.4% cholesterol was mildly elevated LDL 139  Obesity her weight is down to 244 pounds weight approximately 1 year ago was around 300 pounds  Iron def anemia - had CBC last week which was normal   Hydradnitis still on skyrizi injection and MTX, folic acid     Review Of Systems:  GEN- denies fatigue, fever, weight loss,weakness, recent illness HEENT- denies eye drainage, change in vision, nasal discharge, CVS- denies chest pain, palpitations RESP- denies SOB, cough, wheeze ABD- denies N/V, change in stools, abd pain GU- denies dysuria, hematuria, dribbling, incontinence MSK- denies joint pain, muscle aches, injury Neuro- denies headache, dizziness, syncope, seizure activity       Objective:    BP 128/64   Pulse 66   Temp 99.5 F (37.5 C) (Oral)   Resp 12   Ht 5\' 6"  (1.676 m)   Wt 244 lb (110.7 kg)   SpO2 99%   BMI 39.38 kg/m  GEN- NAD, alert and oriented x3 Neck- Supple, no LAD CVS- RRR, no murmur RESP-CTAB ABD-NABS,soft,NT,ND Psych- normal affect and mood  EXT- trace ankle edema bilat  Pulses- Radial, DP- 2+        Assessment & Plan:      Problem List Items Addressed This Visit      Unprioritized   Depression with anxiety -  Primary    Doing well with prozac , ativan and therapy sessions No changes       Relevant Medications   FLUoxetine (PROZAC) 40 MG capsule   LORazepam (ATIVAN) 0.5 MG tablet   Hydradenitis    Medications per her specialist, will refill pain meds as needed- norco      Iron deficiency anemia    Continue MVI, had normal recent CBC last week per report       Obesity (BMI 35.0-39.9 without comorbidity)    She continues to lose weight down almost 60lbs in the past year Recheck lipids, suspect thsi will be improved with dietary changes        Relevant Orders   Comprehensive metabolic panel   Lipid panel   PCOS (polycystic ovarian syndrome)   Relevant Orders   Comprehensive metabolic panel    Other Visit Diagnoses    Mild hyperlipidemia       Relevant Orders   Lipid panel      Note: This dictation was prepared with Dragon dictation along with smaller phrase technology. Any transcriptional errors that result from this process are unintentional.

## 2018-12-23 NOTE — Assessment & Plan Note (Signed)
She continues to lose weight down almost 60lbs in the past year Recheck lipids, suspect thsi will be improved with dietary changes

## 2018-12-23 NOTE — Assessment & Plan Note (Signed)
Doing well with prozac , ativan and therapy sessions No changes

## 2018-12-24 LAB — COMPREHENSIVE METABOLIC PANEL
AG Ratio: 1.1 (calc) (ref 1.0–2.5)
ALT: 10 U/L (ref 6–29)
AST: 10 U/L (ref 10–30)
Albumin: 3.9 g/dL (ref 3.6–5.1)
Alkaline phosphatase (APISO): 60 U/L (ref 31–125)
BUN: 11 mg/dL (ref 7–25)
CO2: 23 mmol/L (ref 20–32)
Calcium: 9.3 mg/dL (ref 8.6–10.2)
Chloride: 106 mmol/L (ref 98–110)
Creat: 0.87 mg/dL (ref 0.50–1.10)
Globulin: 3.6 g/dL (calc) (ref 1.9–3.7)
Glucose, Bld: 87 mg/dL (ref 65–99)
Potassium: 4.2 mmol/L (ref 3.5–5.3)
Sodium: 138 mmol/L (ref 135–146)
Total Bilirubin: 0.6 mg/dL (ref 0.2–1.2)
Total Protein: 7.5 g/dL (ref 6.1–8.1)

## 2018-12-24 LAB — LIPID PANEL
Cholesterol: 220 mg/dL — ABNORMAL HIGH (ref <200)
HDL: 53 mg/dL (ref >=50)
LDL Cholesterol (Calc): 147 mg/dL (calc) — ABNORMAL HIGH
Non-HDL Cholesterol (Calc): 167 mg/dL (calc) — ABNORMAL HIGH (ref <130)
Total CHOL/HDL Ratio: 4.2 (calc) (ref <5.0)
Triglycerides: 92 mg/dL (ref <150)

## 2019-01-31 ENCOUNTER — Other Ambulatory Visit: Payer: Self-pay | Admitting: Family Medicine

## 2019-02-03 NOTE — Telephone Encounter (Signed)
Ok to refill??  Last office visit/ refill 12/23/2018, #1 refills.

## 2019-04-02 ENCOUNTER — Other Ambulatory Visit: Payer: Self-pay | Admitting: Family Medicine

## 2019-06-17 LAB — HM PAP SMEAR

## 2019-06-18 ENCOUNTER — Other Ambulatory Visit: Payer: Self-pay | Admitting: Family Medicine

## 2019-06-23 ENCOUNTER — Other Ambulatory Visit: Payer: Self-pay | Admitting: Obstetrics and Gynecology

## 2019-06-23 DIAGNOSIS — R928 Other abnormal and inconclusive findings on diagnostic imaging of breast: Secondary | ICD-10-CM

## 2019-06-23 DIAGNOSIS — N6489 Other specified disorders of breast: Secondary | ICD-10-CM | POA: Insufficient documentation

## 2019-07-07 ENCOUNTER — Other Ambulatory Visit: Payer: BC Managed Care – PPO

## 2019-07-10 ENCOUNTER — Ambulatory Visit: Payer: BC Managed Care – PPO

## 2019-07-11 ENCOUNTER — Ambulatory Visit
Admission: RE | Admit: 2019-07-11 | Discharge: 2019-07-11 | Disposition: A | Discharge status: Home / Self Care | Payer: BC Managed Care – PPO | Source: Ambulatory Visit | Attending: Obstetrics and Gynecology | Admitting: Obstetrics and Gynecology

## 2019-07-11 ENCOUNTER — Other Ambulatory Visit: Payer: Self-pay

## 2019-07-11 ENCOUNTER — Ambulatory Visit: Admission: RE | Admit: 2019-07-11 | Payer: BC Managed Care – PPO | Source: Ambulatory Visit

## 2019-07-11 DIAGNOSIS — R928 Other abnormal and inconclusive findings on diagnostic imaging of breast: Secondary | ICD-10-CM

## 2019-07-22 ENCOUNTER — Other Ambulatory Visit: Payer: Self-pay | Admitting: Family Medicine

## 2019-08-27 ENCOUNTER — Other Ambulatory Visit: Payer: Self-pay | Admitting: Family Medicine

## 2019-10-03 ENCOUNTER — Telehealth: Payer: Self-pay | Admitting: Family Medicine

## 2019-10-03 MED ORDER — PANTOPRAZOLE SODIUM 40 MG PO TBEC: 40.0000 mg | DELAYED_RELEASE_TABLET | Freq: Every day | ORAL | 0 refills | Status: DC

## 2019-10-03 MED ORDER — FLUOXETINE HCL 40 MG PO CAPS: 40.0000 mg | ORAL_CAPSULE | Freq: Every day | ORAL | 0 refills | Status: DC

## 2019-10-03 NOTE — Telephone Encounter (Signed)
Prescription sent to pharmacy.

## 2019-10-03 NOTE — Telephone Encounter (Signed)
CB# [phone] Need refill Prozac, Protonix pt sch for a CPE  01-21-20

## 2019-12-15 ENCOUNTER — Other Ambulatory Visit: Payer: Self-pay

## 2019-12-15 ENCOUNTER — Emergency Department (HOSPITAL_COMMUNITY): Payer: BC Managed Care – PPO

## 2019-12-15 ENCOUNTER — Emergency Department (HOSPITAL_COMMUNITY)
Admission: EM | Admit: 2019-12-15 | Discharge: 2019-12-15 | Disposition: A | Discharge status: Home / Self Care | Payer: BC Managed Care – PPO | Attending: Emergency Medicine | Admitting: Emergency Medicine

## 2019-12-15 ENCOUNTER — Encounter (HOSPITAL_COMMUNITY): Payer: Self-pay | Admitting: *Deleted

## 2019-12-15 DIAGNOSIS — R319 Hematuria, unspecified: Secondary | ICD-10-CM | POA: Diagnosis not present

## 2019-12-15 DIAGNOSIS — J45909 Unspecified asthma, uncomplicated: Secondary | ICD-10-CM | POA: Insufficient documentation

## 2019-12-15 DIAGNOSIS — N201 Calculus of ureter: Secondary | ICD-10-CM

## 2019-12-15 DIAGNOSIS — R109 Unspecified abdominal pain: Secondary | ICD-10-CM | POA: Diagnosis not present

## 2019-12-15 LAB — URINALYSIS, ROUTINE W REFLEX MICROSCOPIC
Bilirubin Urine: NEGATIVE
Glucose, UA: NEGATIVE mg/dL
Hgb urine dipstick: NEGATIVE
Ketones, ur: NEGATIVE mg/dL
Nitrite: NEGATIVE
Protein, ur: NEGATIVE mg/dL
Specific Gravity, Urine: 1.026 (ref 1.005–1.030)
pH: 5 (ref 5.0–8.0)

## 2019-12-15 LAB — POC URINE PREG, ED: Preg Test, Ur: NEGATIVE

## 2019-12-15 LAB — BASIC METABOLIC PANEL
Anion gap: 9 (ref 5–15)
BUN: 12 mg/dL (ref 6–20)
CO2: 24 mmol/L (ref 22–32)
Calcium: 8.8 mg/dL — ABNORMAL LOW (ref 8.9–10.3)
Chloride: 104 mmol/L (ref 98–111)
Creatinine, Ser: 0.65 mg/dL (ref 0.44–1.00)
GFR calc Af Amer: >60 mL/min (ref >60)
GFR calc non Af Amer: >60 mL/min (ref >60)
Glucose, Bld: 131 mg/dL — ABNORMAL HIGH (ref 70–99)
Potassium: 4.1 mmol/L (ref 3.5–5.1)
Sodium: 137 mmol/L (ref 135–145)

## 2019-12-15 LAB — CBC
HCT: 41.7 % (ref 36.0–46.0)
Hemoglobin: 14.3 g/dL (ref 12.0–15.0)
MCH: 27.3 pg (ref 26.0–34.0)
MCHC: 34.3 g/dL (ref 30.0–36.0)
MCV: 79.6 fL — ABNORMAL LOW (ref 80.0–100.0)
Platelets: 315 10*3/uL (ref 150–400)
RBC: 5.24 MIL/uL — ABNORMAL HIGH (ref 3.87–5.11)
RDW: 14.5 % (ref 11.5–15.5)
WBC: 9.4 10*3/uL (ref 4.0–10.5)
nRBC: 0 % (ref 0.0–0.2)

## 2019-12-15 LAB — CBG MONITORING, ED: Glucose-Capillary: 127 mg/dL — ABNORMAL HIGH (ref 70–99)

## 2019-12-15 MED ORDER — OXYCODONE-ACETAMINOPHEN 5-325 MG PO TABS: 1.0000 | ORAL_TABLET | Freq: Four times a day (QID) | ORAL | 0 refills | Status: DC | PRN

## 2019-12-15 MED ORDER — ONDANSETRON HCL 4 MG PO TABS: 4.0000 mg | ORAL_TABLET | Freq: Three times a day (TID) | ORAL | 0 refills | Status: DC | PRN

## 2019-12-15 MED ORDER — ONDANSETRON 4 MG PO TBDP
ORAL_TABLET | ORAL | 0 refills | Status: DC
Start: 2019-12-15 — End: 2020-03-24

## 2019-12-15 MED ORDER — KETOROLAC TROMETHAMINE 30 MG/ML IJ SOLN
30.0000 mg | Freq: Once | INTRAMUSCULAR | Status: AC
  Administered 2019-12-15: 30 mg via INTRAVENOUS
  Filled 2019-12-15: qty 1

## 2019-12-15 MED ORDER — OXYCODONE-ACETAMINOPHEN 5-325 MG PO TABS
1.0000 | ORAL_TABLET | Freq: Once | ORAL | Status: AC
  Administered 2019-12-15: 1 via ORAL
  Filled 2019-12-15: qty 1

## 2019-12-15 MED ORDER — ONDANSETRON HCL 4 MG/2ML IJ SOLN
4.0000 mg | Freq: Once | INTRAMUSCULAR | Status: AC
  Administered 2019-12-15: 4 mg via INTRAVENOUS
  Filled 2019-12-15: qty 2

## 2019-12-15 MED ORDER — HYDROMORPHONE HCL 1 MG/ML IJ SOLN
1.0000 mg | Freq: Once | INTRAMUSCULAR | Status: AC
  Administered 2019-12-15: 1 mg via INTRAVENOUS
  Filled 2019-12-15: qty 1

## 2019-12-15 MED ORDER — TAMSULOSIN HCL 0.4 MG PO CAPS: 0.4000 mg | ORAL_CAPSULE | Freq: Every day | ORAL | 0 refills | Status: DC

## 2019-12-15 NOTE — Discharge Instructions (Signed)
You were seen in the emergency department and found to have a kidney stone.  We are sending you home with multiple medications to assist with passing the stone:  ° °-Flomax-this is a medication to help pass the stone, it allows urine to exit the body more freely.  Please take this once daily with a meal. ° °-Percocet-this is a narcotic/controlled substance medication that has potential addicting qualities.  We recommend that you take 1-2 tablets every 6 hours as needed for severe pain.  Do not drive or operate heavy machinery when taking this medicine as it can be sedating. Do not drink alcohol or take other sedating medications when taking this medicine for safety reasons.  Keep this out of reach of small children.  Please be aware this medicine has Tylenol in it (325 mg/tab) do not exceed the maximum dose of Tylenol in a day per over the counter recommendations should you decide to supplement with Tylenol over the counter.  ° °-Zofran-this is an antinausea medication, you may take this every 8 hours as needed for nausea and vomiting, please allow the tablet to dissolve underneath of your tongue.  ° °We have prescribed you new medication(s) today. Discuss the medications prescribed today with your pharmacist as they can have adverse effects and interactions with your other medicines including over the counter and prescribed medications. Seek medical evaluation if you start to experience new or abnormal symptoms after taking one of these medicines, seek care immediately if you start to experience difficulty breathing, feeling of your throat closing, facial swelling, or rash as these could be indications of a more serious allergic reaction ° °Please follow-up with the urology group provided in your discharge instructions within 3 to 5 days.  Return to the ER for new or worsening symptoms including but not limited to worsening pain not controlled by these medicines, inability to keep fluids down, fever, or any other  concerns that you may have. °

## 2019-12-15 NOTE — ED Triage Notes (Signed)
Left flank pain on set yesterday, history of kidney stone

## 2019-12-15 NOTE — ED Notes (Signed)
Flushed pt's PAC with 5 mL of Heparin, de accessed PAC. Site WNL. dsg applied to site.

## 2019-12-15 NOTE — ED Provider Notes (Signed)
[facility] EMERGENCY DEPARTMENT Provider Note   CSN: [id number] Arrival date & time: 12/15/19  1327     History Chief Complaint  Patient presents with   Flank Pain    Sonya Olson is a 39 y.o. female with a history of asthma, hydradenitis, allergy and PCOS presenting with a 1 day history of left flank pain which started suddenly early this morning c/w prior history of kidney stones.  She denies fevers or chills, also with no vomiting but with nausea and reduced oral intake today.  She has seen [facility] in the past but has passed renal stones without need of intervention.    Of note,  She undergoes remicade infusions and is also on methotrexate for her hidradenitis.   HPI     Past Medical History:  Diagnosis Date   Allergy    Asthma    Eczema    Hydradenitis    on mtx and remicade through [facility]   Kidney stones 2008   last episode   PCOS (polycystic ovarian syndrome)     Patient Active Problem List   Diagnosis Date Noted   Cellulitis of right arm 03/05/2018   Iron deficiency anemia 03/03/2018   Atypical chest pain    GERD (gastroesophageal reflux disease) 01/16/2018   Depression with anxiety 06/20/2017   Mild persistent asthma 11/12/2015   Atopic dermatitis 11/12/2015   Perennial and seasonal allergic rhinitis 01/06/2015   Hydradenitis 01/06/2015   OSA (obstructive sleep apnea) 01/06/2015   Family history of premature coronary artery disease 01/06/2015   Obesity (BMI 35.0-39.9 without comorbidity) 01/06/2015   PCOS (polycystic ovarian syndrome) 08/28/2011    Past Surgical History:  Procedure Laterality Date   APPENDECTOMY     LITHOTRIPSY  2008   NECK SURGERY       OB History    Gravida  1   Para  1   Term  1   Preterm      AB      Living  1     SAB      TAB      Ectopic      Multiple      Live Births              Family History  Problem Relation Age of Onset   Hypertension Mother    Diabetes  Mother    Heart disease Mother 56       CABG @ 24   Thyroid disease Sister    Kidney disease Sister        2 kidney transplants   Cancer Sister        OVARIAN   Alcohol abuse Father    Asthma Daughter    Hypertension Maternal Uncle    Diabetes Maternal Uncle    Heart disease Maternal Uncle 15       CABG @ 22   Stroke Maternal Grandmother    Hypertension Maternal Grandfather    Heart disease Maternal Grandfather 39       Died of MI @ 52--No Dxed CAD prior   Mental illness Maternal Uncle     Social History   Tobacco Use   Smoking status: Never Smoker   Smokeless tobacco: Never Used  Substance Use Topics   Alcohol use: No   Drug use: No    Home Medications Prior to Admission medications   Medication Sig Start Date End Date Taking? Authorizing Provider  acetaminophen (TYLENOL) 325 MG tablet Take 650 mg by mouth  every 6 (six) hours as needed for mild pain or headache.    [provider]  albuterol (PROVENTIL HFA;VENTOLIN HFA) 108 (90 Base) MCG/ACT inhaler Inhale 1-2 puffs into the lungs every 6 (six) hours as needed for wheezing or shortness of breath.    [provider]  desonide (DESOWEN) 0.05 % cream Apply 1 application topically daily as needed (rash).  10/20/14   [provider]  fluocinonide cream (LIDEX) 0.05 % Apply 1 application topically daily.    [provider]  FLUoxetine (PROZAC) 40 MG capsule Take 1 capsule (40 mg total) by mouth daily. 10/03/19   [place], Velna Hatchet, MD  fluticasone [facility]) 50 MCG/ACT nasal spray Place 1 spray into both nostrils 2 (two) times daily as needed for allergies or rhinitis. 10/11/17   Alfonse Spruce, MD  HYDROcodone-acetaminophen (NORCO) 5-325 MG tablet Take 1 tablet by mouth every 6 (six) hours as needed for moderate pain. 09/18/18   Salley Scarlet, MD  inFLIXimab in sodium chloride 0.9 % Inject 5 mg/kg into the vein every 30 (thirty) days. remicade    [provider]    levocetirizine (XYZAL) 5 MG tablet Take 1 tablet (5 mg total) by mouth every evening. 09/26/17   Salley Scarlet, MD  levonorgestrel-ethinyl estradiol (AVIANE,ALESSE,LESSINA) 0.1-20 MG-MCG tablet Take 1 tablet by mouth.    [provider]  loratadine (CLARITIN) 10 MG tablet Take 10 mg by mouth daily.    [provider]  LORazepam (ATIVAN) 0.5 MG tablet TAKE 1 TABLET BY MOUTH EVERY 8 HOURS. 02/04/19   [place], Velna Hatchet, MD  methotrexate (RHEUMATREX) 2.5 MG tablet Take 6 tablets by mouth once a week. Takes on Saturdays 02/18/18   [provider]  montelukast (SINGULAIR) 10 MG tablet Take 10 mg by mouth at bedtime.    [provider]  pantoprazole (PROTONIX) 40 MG tablet Take 1 tablet (40 mg total) by mouth daily. 10/03/19   [place], Velna Hatchet, MD  Risankizumab-rzaa (SKYRIZI, 150 MG DOSE, [place]) Inject 300 mg into the skin every 30 (thirty) days.    [provider]    Allergies    Bee pollen, Other, Strawberry extract, and Golimumab  Review of Systems   Review of Systems  Constitutional: Negative for chills and fever.  HENT: Negative.   Eyes: Negative.   Respiratory: Negative for chest tightness and shortness of breath.   Cardiovascular: Negative for chest pain.  Gastrointestinal: Positive for nausea. Negative for abdominal pain and vomiting.  Genitourinary: Positive for flank pain and hematuria. Negative for dysuria.  Musculoskeletal: Negative for arthralgias, joint swelling and neck pain.  Skin: Negative.  Negative for rash and wound.  Neurological: Negative for dizziness, weakness, light-headedness, numbness and headaches.  Psychiatric/Behavioral: Negative.     Physical Exam Updated Vital Signs BP 138/76 (BP Location: Right Arm)    Pulse 67    Temp (!) 97.5 F (36.4 C)    Resp 18    Ht 5\' 7"  (1.702 m)    Wt 111.1 kg    SpO2 99%    BMI 38.37 kg/m   Physical Exam Vitals and nursing note reviewed.  Constitutional:      Appearance: She is  well-developed.  HENT:     Head: Normocephalic and atraumatic.  Eyes:     Conjunctiva/sclera: Conjunctivae normal.  Cardiovascular:     Rate and Rhythm: Normal rate and regular rhythm.     Heart sounds: Normal heart sounds.  Pulmonary:     Effort: Pulmonary  effort is normal.     Breath sounds: Normal breath sounds. No wheezing.  Abdominal:     General: Bowel sounds are normal. There is no distension.     Palpations: Abdomen is soft.     Tenderness: There is no abdominal tenderness. There is no guarding.     Comments: Left cva tenderness.  Musculoskeletal:        General: Normal range of motion.     Cervical back: Normal range of motion.  Skin:    General: Skin is warm and dry.  Neurological:     Mental Status: She is alert.     ED Results / Procedures / Treatments   Labs (all labs ordered are listed, but only abnormal results are displayed) Labs Reviewed  URINALYSIS, ROUTINE W REFLEX MICROSCOPIC - Abnormal; Notable for the following components:      Result Value   APPearance HAZY (*)    Leukocytes,Ua TRACE (*)    Bacteria, UA FEW (*)    All other components within normal limits  BASIC METABOLIC PANEL - Abnormal; Notable for the following components:   Glucose, Bld 131 (*)    Calcium 8.8 (*)    All other components within normal limits  CBC - Abnormal; Notable for the following components:   RBC 5.24 (*)    MCV 79.6 (*)    All other components within normal limits  CBG MONITORING, ED - Abnormal; Notable for the following components:   Glucose-Capillary 127 (*)    All other components within normal limits  POC URINE PREG, ED    EKG None  Radiology No results found.  Procedures Procedures (including critical care time)  Medications Ordered in ED Medications  HYDROmorphone (DILAUDID) injection 1 mg (1 mg Intravenous Given 12/15/19 1837)  ondansetron (ZOFRAN) injection 4 mg (4 mg Intravenous Given 12/15/19 1837)    ED Course  I have reviewed the triage vital  signs and the nursing notes.  Pertinent labs & imaging results that were available during my care of the patient were reviewed by me and considered in my medical decision making (see chart for details).    MDM Rules/Calculators/A&P                          Patient with sudden onset of left flank pain with history of kidney stones, suspect this was a passing left ureteral stone.  Pending CT imaging at this time.  Her labs including urinalysis are stable.  Discussed with Jodi Geralds, PA-C who assumes patient care. Final Clinical Impression(s) / ED Diagnoses Final diagnoses:  None    Rx / DC Orders ED Discharge Orders    None       Victoriano Lain 12/15/19 1911    Derwood Kaplan, MD 12/16/19 [phone]

## 2019-12-15 NOTE — ED Provider Notes (Signed)
Care assumed from PA Burgess Amor, please see her note for full details, but in brief Sonya Olson is a 39 y.o. female who presents with sudden onset left flank pain associated with some nausea, history of kidney stones with similar symptoms in the past.  Patient's lab work has been reassuring with no leukocytosis, normal renal function and urine without signs of infection.  Patient given IV pain medication and CT renal study pending at discharge.  Plan: We will follow up on renal study and reassess patient's pain.  Physical Exam  BP 138/76 (BP Location: Right Arm)   Pulse 60   Temp (!) 97.5 F (36.4 C)   Resp 18   Ht 5\' 7"  (1.702 m)   Wt 111.1 kg   SpO2 92%   BMI 38.37 kg/m   Physical Exam Vitals and nursing note reviewed.  Constitutional:      General: She is not in acute distress.    Appearance: Normal appearance. She is well-developed. She is not ill-appearing or diaphoretic.  HENT:     Head: Normocephalic and atraumatic.  Eyes:     General:        Right eye: No discharge.        Left eye: No discharge.  Pulmonary:     Effort: Pulmonary effort is normal. No respiratory distress.  Neurological:     Mental Status: She is alert.     Coordination: Coordination normal.  Psychiatric:        Behavior: Behavior normal.     ED Course/Procedures   Labs Reviewed  URINALYSIS, ROUTINE W REFLEX MICROSCOPIC - Abnormal; Notable for the following components:      Result Value   APPearance HAZY (*)    Leukocytes,Ua TRACE (*)    Bacteria, UA FEW (*)    All other components within normal limits  BASIC METABOLIC PANEL - Abnormal; Notable for the following components:   Glucose, Bld 131 (*)    Calcium 8.8 (*)    All other components within normal limits  CBC - Abnormal; Notable for the following components:   RBC 5.24 (*)    MCV 79.6 (*)    All other components within normal limits  CBG MONITORING, ED - Abnormal; Notable for the following components:   Glucose-Capillary 127 (*)     All other components within normal limits  POC URINE PREG, ED   CT Renal Stone Study  Result Date: 12/15/2019 CLINICAL DATA:  39 year old female with flank pain. Concern for kidney stone. EXAM: CT ABDOMEN AND PELVIS WITHOUT CONTRAST TECHNIQUE: Multidetector CT imaging of the abdomen and pelvis was performed following the standard protocol without IV contrast. COMPARISON:  CT dated 04/11/2009. FINDINGS: Evaluation of this exam is limited in the absence of intravenous contrast. Lower chest: The visualized lung bases are clear. Linear left lung base atelectasis/scarring. No intra-abdominal free air or free fluid. Hepatobiliary: No focal liver abnormality is seen. No gallstones, gallbladder wall thickening, or biliary dilatation. Pancreas: Unremarkable. No pancreatic ductal dilatation or surrounding inflammatory changes. Spleen: Normal in size without focal abnormality. Adrenals/Urinary Tract: The adrenal glands unremarkable. There is a 4 mm stone in the proximal left ureter with mild left hydronephrosis. The right kidney, right ureter, and urinary bladder appear unremarkable. Stomach/Bowel: Postsurgical changes of gastric sleeve. There is no bowel obstruction or active inflammation. The appendix is normal. Vascular/Lymphatic: The abdominal aorta and IVC unremarkable. No portal venous gas. There is no adenopathy. Reproductive: The uterus is anteverted and grossly unremarkable. No adnexal masses. Other:  None Musculoskeletal: No acute or significant osseous findings. IMPRESSION: A 4 mm proximal left ureteral stone with mild left hydronephrosis. Electronically Signed   By: Elgie Collard M.D.   On: 12/15/2019 19:35   Procedures  MDM   Patient presents to the ED with complaints of flank/abdominal pain. Patient nontoxic appearing, vitals without significant abnormalities.  Signout pending renal stone study.  CT renal study with left 4 mm proximal renal stone. Renal function preserved. Urinalysis without  appearance of superimposed infection. Patient tolerating PO in the ER with pain well controlled. Will discharge home with Flomax, Zofran, and Percocet with urology follow up.  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  [place] Controlled Substance reporting System queried. I discussed results, treatment plan, need for urology follow-up, and return precautions with the patient. Provided opportunity for questions, patient confirmed understanding and is in agreement with plan.   Final diagnoses:  Left flank pain  Left ureteral stone    ED Discharge Orders         Ordered    oxyCODONE-acetaminophen (PERCOCET/ROXICET) 5-325 MG tablet  Every 6 hours PRN     Discontinue  Reprint     12/15/19 2146    ondansetron (ZOFRAN) 4 MG tablet  Every 8 hours PRN     Discontinue  Reprint     12/15/19 2146    ondansetron (ZOFRAN ODT) 4 MG disintegrating tablet     Discontinue  Reprint     12/15/19 2147    oxyCODONE-acetaminophen (PERCOCET) 5-325 MG tablet  Every 6 hours PRN     Discontinue  Reprint     12/15/19 2147    tamsulosin (FLOMAX) 0.4 MG CAPS capsule  Daily     Discontinue  Reprint     12/15/19 2147               Dartha Lodge, PA-C 12/15/19 2307    Derwood Kaplan, MD 12/16/19 [phone]

## 2019-12-16 MED FILL — Oxycodone w/ Acetaminophen Tab 5-325 MG: ORAL | Qty: 6 | Status: AC

## 2019-12-27 ENCOUNTER — Other Ambulatory Visit: Payer: Self-pay

## 2019-12-27 ENCOUNTER — Emergency Department (HOSPITAL_COMMUNITY): Payer: BC Managed Care – PPO

## 2019-12-27 ENCOUNTER — Emergency Department (HOSPITAL_COMMUNITY)
Admission: EM | Admit: 2019-12-27 | Discharge: 2019-12-27 | Disposition: A | Discharge status: Home / Self Care | Payer: BC Managed Care – PPO | Attending: Emergency Medicine | Admitting: Emergency Medicine

## 2019-12-27 ENCOUNTER — Encounter (HOSPITAL_COMMUNITY): Payer: Self-pay

## 2019-12-27 DIAGNOSIS — J45909 Unspecified asthma, uncomplicated: Secondary | ICD-10-CM | POA: Insufficient documentation

## 2019-12-27 DIAGNOSIS — N23 Unspecified renal colic: Secondary | ICD-10-CM

## 2019-12-27 DIAGNOSIS — Z79899 Other long term (current) drug therapy: Secondary | ICD-10-CM | POA: Diagnosis not present

## 2019-12-27 DIAGNOSIS — R109 Unspecified abdominal pain: Secondary | ICD-10-CM | POA: Diagnosis present

## 2019-12-27 LAB — CBC WITH DIFFERENTIAL/PLATELET
Abs Immature Granulocytes: 0.03 10*3/uL (ref 0.00–0.07)
Basophils Absolute: 0 10*3/uL (ref 0.0–0.1)
Basophils Relative: 0 %
Eosinophils Absolute: 0 10*3/uL (ref 0.0–0.5)
Eosinophils Relative: 0 %
HCT: 37.3 % (ref 36.0–46.0)
Hemoglobin: 12.7 g/dL (ref 12.0–15.0)
Immature Granulocytes: 0 %
Lymphocytes Relative: 15 %
Lymphs Abs: 1.2 10*3/uL (ref 0.7–4.0)
MCH: 27.1 pg (ref 26.0–34.0)
MCHC: 34 g/dL (ref 30.0–36.0)
MCV: 79.7 fL — ABNORMAL LOW (ref 80.0–100.0)
Monocytes Absolute: 0.4 10*3/uL (ref 0.1–1.0)
Monocytes Relative: 5 %
Neutro Abs: 6.6 10*3/uL (ref 1.7–7.7)
Neutrophils Relative %: 80 %
Platelets: 249 10*3/uL (ref 150–400)
RBC: 4.68 MIL/uL (ref 3.87–5.11)
RDW: 14.4 % (ref 11.5–15.5)
WBC: 8.3 10*3/uL (ref 4.0–10.5)
nRBC: 0 % (ref 0.0–0.2)

## 2019-12-27 LAB — BASIC METABOLIC PANEL
Anion gap: 8 (ref 5–15)
BUN: 12 mg/dL (ref 6–20)
CO2: 22 mmol/L (ref 22–32)
Calcium: 8.5 mg/dL — ABNORMAL LOW (ref 8.9–10.3)
Chloride: 105 mmol/L (ref 98–111)
Creatinine, Ser: 0.69 mg/dL (ref 0.44–1.00)
GFR calc Af Amer: >60 mL/min (ref >60)
GFR calc non Af Amer: >60 mL/min (ref >60)
Glucose, Bld: 135 mg/dL — ABNORMAL HIGH (ref 70–99)
Potassium: 4.2 mmol/L (ref 3.5–5.1)
Sodium: 135 mmol/L (ref 135–145)

## 2019-12-27 LAB — URINALYSIS, ROUTINE W REFLEX MICROSCOPIC
Bacteria, UA: NONE SEEN
Bilirubin Urine: NEGATIVE
Glucose, UA: NEGATIVE mg/dL
Ketones, ur: NEGATIVE mg/dL
Leukocytes,Ua: NEGATIVE
Nitrite: NEGATIVE
Protein, ur: 30 mg/dL — AB
RBC / HPF: >50 RBC/hpf — ABNORMAL HIGH (ref 0–5)
Specific Gravity, Urine: 1.026 (ref 1.005–1.030)
pH: 5 (ref 5.0–8.0)

## 2019-12-27 LAB — PREGNANCY, URINE: Preg Test, Ur: NEGATIVE

## 2019-12-27 MED ORDER — KETOROLAC TROMETHAMINE 30 MG/ML IJ SOLN
30.0000 mg | Freq: Once | INTRAMUSCULAR | Status: AC
  Administered 2019-12-27: 30 mg via INTRAVENOUS
  Filled 2019-12-27: qty 1

## 2019-12-27 MED ORDER — SODIUM CHLORIDE 0.9 % IV BOLUS
1000.0000 mL | Freq: Once | INTRAVENOUS | Status: AC
  Administered 2019-12-27: 1000 mL via INTRAVENOUS

## 2019-12-27 MED ORDER — HYDROMORPHONE HCL 1 MG/ML IJ SOLN
1.0000 mg | Freq: Once | INTRAMUSCULAR | Status: AC
  Administered 2019-12-27: 1 mg via INTRAVENOUS
  Filled 2019-12-27: qty 1

## 2019-12-27 MED ORDER — OXYCODONE-ACETAMINOPHEN 5-325 MG PO TABS: 1.0000 | ORAL_TABLET | Freq: Three times a day (TID) | ORAL | 0 refills | Status: DC | PRN

## 2019-12-27 MED ORDER — ONDANSETRON HCL 4 MG/2ML IJ SOLN
4.0000 mg | Freq: Once | INTRAMUSCULAR | Status: AC
  Administered 2019-12-27: 4 mg via INTRAVENOUS
  Filled 2019-12-27: qty 2

## 2019-12-27 NOTE — ED Notes (Signed)
Pt ambulatory to restroom

## 2019-12-27 NOTE — ED Provider Notes (Signed)
[facility] EMERGENCY DEPARTMENT Provider Note   CSN: [id number] Arrival date & time: 12/27/19  0505     History Chief Complaint  Patient presents with  . Flank Pain    Sonya Olson is a 39 y.o. female.  Patient returns with left flank pain that radiates on her left abdomen.  Pain comes and goes.  She was diagnosed with a kidney stone here on August 16 but has not yet seen urology.  She states that when she tries to urinate there is some hesitancy but there she states able to urinate normal amounts.  No dysuria.  Does have some hematuria.  No fevers.  Nausea but no vomiting.  States she still has the pain medication she was prescribed at home.  She is concerned that something could be blocking her flow of urine.  States she has needed a stent in the past.  The history is provided by the patient.  Flank Pain Pertinent negatives include no chest pain, no abdominal pain, no headaches and no shortness of breath.       Past Medical History:  Diagnosis Date  . Allergy   . Asthma   . Eczema   . Hydradenitis    on mtx and remicade through [facility]  . Kidney stones 2008   last episode  . PCOS (polycystic ovarian syndrome)     Patient Active Problem List   Diagnosis Date Noted  . Cellulitis of right arm 03/05/2018  . Iron deficiency anemia 03/03/2018  . Atypical chest pain   . GERD (gastroesophageal reflux disease) 01/16/2018  . Depression with anxiety 06/20/2017  . Mild persistent asthma 11/12/2015  . Atopic dermatitis 11/12/2015  . Perennial and seasonal allergic rhinitis 01/06/2015  . Hydradenitis 01/06/2015  . OSA (obstructive sleep apnea) 01/06/2015  . Family history of premature coronary artery disease 01/06/2015  . Obesity (BMI 35.0-39.9 without comorbidity) 01/06/2015  . PCOS (polycystic ovarian syndrome) 08/28/2011    Past Surgical History:  Procedure Laterality Date  . APPENDECTOMY    . LITHOTRIPSY  2008  . NECK SURGERY       OB History    Gravida  1     Para  1   Term  1   Preterm      AB      Living  1     SAB      TAB      Ectopic      Multiple      Live Births              Family History  Problem Relation Age of Onset  . Hypertension Mother   . Diabetes Mother   . Heart disease Mother 61       CABG @ 59  . Thyroid disease Sister   . Kidney disease Sister        2 kidney transplants  . Cancer Sister        OVARIAN  . Alcohol abuse Father   . Asthma Daughter   . Hypertension Maternal Uncle   . Diabetes Maternal Uncle   . Heart disease Maternal Uncle 51       CABG @ 51  . Stroke Maternal Grandmother   . Hypertension Maternal Grandfather   . Heart disease Maternal Grandfather 13       Died of MI @ 52--No Dxed CAD prior  . Mental illness Maternal Uncle     Social History   Tobacco Use  . Smoking status: Never  Smoker  . Smokeless tobacco: Never Used  Substance Use Topics  . Alcohol use: No  . Drug use: No    Home Medications Prior to Admission medications   Medication Sig Start Date End Date Taking? Authorizing Provider  acetaminophen (TYLENOL) 325 MG tablet Take 650 mg by mouth every 6 (six) hours as needed for mild pain or headache.    [provider]  albuterol (PROVENTIL HFA;VENTOLIN HFA) 108 (90 Base) MCG/ACT inhaler Inhale 1-2 puffs into the lungs every 6 (six) hours as needed for wheezing or shortness of breath.    [provider]  desonide (DESOWEN) 0.05 % cream Apply 1 application topically daily as needed (rash).  10/20/14   [provider]  fluocinonide cream (LIDEX) 0.05 % Apply 1 application topically daily.    [provider]  FLUoxetine (PROZAC) 40 MG capsule Take 1 capsule (40 mg total) by mouth daily. 10/03/19   [place], Velna Hatchet, MD  fluticasone [facility]) 50 MCG/ACT nasal spray Place 1 spray into both nostrils 2 (two) times daily as needed for allergies or rhinitis. 10/11/17   Alfonse Spruce, MD  folic acid (FOLVITE) 1 MG tablet Take 3 mg by  mouth daily. 11/19/19   [provider]  HYDROcodone-acetaminophen (NORCO) 5-325 MG tablet Take 1 tablet by mouth every 6 (six) hours as needed for moderate pain. 09/18/18   Salley Scarlet, MD  inFLIXimab in sodium chloride 0.9 % Inject 5 mg/kg into the vein every 30 (thirty) days. remicade    [provider]  levocetirizine (XYZAL) 5 MG tablet Take 1 tablet (5 mg total) by mouth every evening. 09/26/17   Salley Scarlet, MD  levonorgestrel-ethinyl estradiol (AVIANE,ALESSE,LESSINA) 0.1-20 MG-MCG tablet Take 1 tablet by mouth.    [provider]  loratadine (CLARITIN) 10 MG tablet Take 10 mg by mouth daily.    [provider]  LORazepam (ATIVAN) 0.5 MG tablet TAKE 1 TABLET BY MOUTH EVERY 8 HOURS. 02/04/19   [place], Velna Hatchet, MD  methotrexate (RHEUMATREX) 2.5 MG tablet Take 6 tablets by mouth once a week. Takes on Saturdays 02/18/18   [provider]  montelukast (SINGULAIR) 10 MG tablet Take 10 mg by mouth at bedtime.    [provider]  ondansetron (ZOFRAN ODT) 4 MG disintegrating tablet 4mg  ODT q4 hours prn nausea/vomit 12/15/19   12/17/19, PA-C  ondansetron (ZOFRAN) 4 MG tablet Take 1 tablet (4 mg total) by mouth every 8 (eight) hours as needed for nausea or vomiting. 12/15/19   12/17/19, PA-C  oxyCODONE-acetaminophen (PERCOCET) 5-325 MG tablet Take 1-2 tablets by mouth every 6 (six) hours as needed. 12/15/19   12/17/19, PA-C  oxyCODONE-acetaminophen (PERCOCET/ROXICET) 5-325 MG tablet Take 1-2 tablets by mouth every 6 (six) hours as needed for severe pain. 12/15/19   12/17/19, PA-C  pantoprazole (PROTONIX) 40 MG tablet Take 1 tablet (40 mg total) by mouth daily. 10/03/19   [place], 12/03/19, MD  Risankizumab-rzaa (SKYRIZI, 150 MG DOSE, [place]) Inject 300 mg into the skin every 30 (thirty) days.    [provider]  tamsulosin (FLOMAX) 0.4 MG CAPS capsule Take 1 capsule (0.4 mg total) by mouth daily. 12/15/19   12/17/19, PA-C    Allergies    Bee pollen, Other, Strawberry extract, and Golimumab  Review of Systems   Review of Systems  Constitutional: Negative for activity change, appetite change, fatigue and fever.  HENT: Negative for congestion and rhinorrhea.  Respiratory: Negative for cough, chest tightness and shortness of breath.   Cardiovascular: Negative for chest pain.  Gastrointestinal: Positive for nausea. Negative for abdominal pain and vomiting.  Genitourinary: Positive for decreased urine volume, difficulty urinating, dysuria, flank pain and urgency. Negative for hematuria.  Musculoskeletal: Positive for back pain.  Skin: Negative for color change and rash.  Neurological: Negative for dizziness, weakness and headaches.   all other systems are negative except as noted in the HPI and PMH.   Physical Exam Updated Vital Signs Ht  (1.702 m)   Wt 111.1 kg   BMI 38.37 kg/m   Physical Exam Vitals and nursing note reviewed.  Constitutional:      General: She is not in acute distress.    Appearance: She is well-developed. She is obese.     Comments: uncomfortable  HENT:     Head: Normocephalic and atraumatic.     Mouth/Throat:     Pharynx: No oropharyngeal exudate.  Eyes:     Conjunctiva/sclera: Conjunctivae normal.     Pupils: Pupils are equal, round, and reactive to light.  Neck:     Comments: No meningismus. Cardiovascular:     Rate and Rhythm: Normal rate and regular rhythm.     Heart sounds: Normal heart sounds. No murmur heard.   Pulmonary:     Effort: Pulmonary effort is normal. No respiratory distress.     Breath sounds: Normal breath sounds.  Abdominal:     Palpations: Abdomen is soft.     Tenderness: There is abdominal tenderness. There is no guarding or rebound.     Comments: Left upper quadrant and lower quadrant tenderness, no guarding or rebound  Musculoskeletal:        General: Tenderness present. Normal range of motion.     Cervical back: Normal range of  motion and neck supple.     Comments: Left paraspinal lumbar tenderness  Skin:    General: Skin is warm.  Neurological:     Mental Status: She is alert and oriented to person, place, and time.     Cranial Nerves: No cranial nerve deficit.     Motor: No abnormal muscle tone.     Coordination: Coordination normal.     Comments:  5/5 strength throughout. CN 2-12 intact.Equal grip strength.   Psychiatric:        Behavior: Behavior normal.     ED Results / Procedures / Treatments   Labs (all labs ordered are listed, but only abnormal results are displayed) Labs Reviewed  URINE CULTURE  URINALYSIS, ROUTINE W REFLEX MICROSCOPIC  PREGNANCY, URINE  CBC WITH DIFFERENTIAL/PLATELET  BASIC METABOLIC PANEL    EKG None  Radiology No results found.  Procedures Procedures (including critical care time)  Medications Ordered in ED Medications  sodium chloride 0.9 % bolus 1,000 mL (has no administration in time range)  ondansetron (ZOFRAN) injection 4 mg (has no administration in time range)  HYDROmorphone (DILAUDID) injection 1 mg (has no administration in time range)    ED Course  I have reviewed the triage vital signs and the nursing notes.  Pertinent labs & imaging results that were available during my care of the patient were reviewed by me and considered in my medical decision making (see chart for details).    MDM Rules/Calculators/A&P                         Ongoing left flank pain without fever or vomiting.  CT scan  July 16 showed 4 mm proximal left ureteral stone with mild left hydronephrosis.  UA at that time was negative.  Patient given pain and nausea control here.  Awaiting labs and urinalysis.  X-ray to be obtained to evaluate position of kidney stone.  We will need to rule out urinary tract infection. Care to be transferred at shift change to Dr. Rubin Payor. Anticipate discharge with urology follow-up when pain or nausea are controlled and no evidence of  UTI. Final Clinical Impression(s) / ED Diagnoses Final diagnoses:  Renal colic    Rx / DC Orders ED Discharge Orders    None       Dabid [place], Jeannett Senior, MD 12/27/19 [phone]

## 2019-12-27 NOTE — ED Notes (Signed)
Patient transported to X-ray 

## 2019-12-27 NOTE — ED Provider Notes (Signed)
  Physical Exam  BP 121/68 (BP Location: Left Arm)   Pulse 69   Temp (!) 97.5 F (36.4 C) (Oral)   Resp 18   Ht 5\' 7"  (1.702 m)   Wt 111.1 kg   SpO2 98%   BMI 38.37 kg/m   Physical Exam  ED Course/Procedures     Procedures  MDM  Patient with flank pain.  Known kidney stone.  Appears to have progressed some.  Only 3 mm.  Pain improved.  Will discharge home.  Drug database reviewed.  Does have few pills left at home but will give another prescription in case she uses those up.  Will need to follow with urology.       , MD 12/27/19 1152

## 2019-12-27 NOTE — ED Triage Notes (Signed)
Pt reports left sided flank pain that comes around to abdomen. Pt says she was seen here last week and dx with kidney stone, but has not f/u with urology. Pt c/o continued pain.

## 2019-12-27 NOTE — Discharge Instructions (Signed)
Followup with the urologist. Return to the ED if you develop worsening pain, fever, vomiting, unable to urinate or any other concerns.

## 2019-12-28 LAB — URINE CULTURE: Culture: <10000 — AB

## 2020-01-08 ENCOUNTER — Other Ambulatory Visit: Payer: Self-pay | Admitting: Family Medicine

## 2020-01-08 MED ORDER — PANTOPRAZOLE SODIUM 40 MG PO TBEC: 40.0000 mg | DELAYED_RELEASE_TABLET | Freq: Every day | ORAL | 0 refills | Status: DC

## 2020-01-08 MED ORDER — FLUOXETINE HCL 40 MG PO CAPS: 40.0000 mg | ORAL_CAPSULE | Freq: Every day | ORAL | 0 refills | Status: DC

## 2020-01-21 ENCOUNTER — Encounter: Payer: BC Managed Care – PPO | Admitting: Family Medicine

## 2020-02-03 ENCOUNTER — Other Ambulatory Visit: Payer: Self-pay | Admitting: Urology

## 2020-02-17 ENCOUNTER — Other Ambulatory Visit: Payer: Self-pay | Admitting: Family Medicine

## 2020-03-01 ENCOUNTER — Other Ambulatory Visit (HOSPITAL_COMMUNITY): Payer: BC Managed Care – PPO

## 2020-03-04 ENCOUNTER — Encounter (HOSPITAL_BASED_OUTPATIENT_CLINIC_OR_DEPARTMENT_OTHER): Admission: RE | Payer: Self-pay | Source: Home / Self Care

## 2020-03-04 ENCOUNTER — Ambulatory Visit (HOSPITAL_BASED_OUTPATIENT_CLINIC_OR_DEPARTMENT_OTHER): Admission: RE | Admit: 2020-03-04 | Payer: BC Managed Care – PPO | Source: Home / Self Care | Admitting: Urology

## 2020-03-04 SURGERY — CYSTOURETEROSCOPY, WITH STENT INSERTION
Anesthesia: General | Laterality: Left

## 2020-03-10 ENCOUNTER — Encounter: Payer: Self-pay | Admitting: Family Medicine

## 2020-03-10 MED ORDER — LORAZEPAM 0.5 MG PO TABS
0.5000 mg | ORAL_TABLET | Freq: Three times a day (TID) | ORAL | 0 refills | Status: DC
Start: 2020-03-10 — End: 2020-04-06

## 2020-03-24 ENCOUNTER — Other Ambulatory Visit: Payer: Self-pay

## 2020-03-24 ENCOUNTER — Encounter: Payer: Self-pay | Admitting: Family Medicine

## 2020-03-24 ENCOUNTER — Ambulatory Visit (INDEPENDENT_AMBULATORY_CARE_PROVIDER_SITE_OTHER): Payer: BC Managed Care – PPO | Admitting: Family Medicine

## 2020-03-24 VITALS — BP 138/78 | HR 80 | Temp 98.0°F | Resp 14 | Ht 67.0 in | Wt 263.0 lb

## 2020-03-24 DIAGNOSIS — Z0000 Encounter for general adult medical examination without abnormal findings: Secondary | ICD-10-CM

## 2020-03-24 DIAGNOSIS — Z903 Acquired absence of stomach [part of]: Secondary | ICD-10-CM

## 2020-03-24 DIAGNOSIS — F418 Other specified anxiety disorders: Secondary | ICD-10-CM

## 2020-03-24 DIAGNOSIS — E559 Vitamin D deficiency, unspecified: Secondary | ICD-10-CM

## 2020-03-24 DIAGNOSIS — E669 Obesity, unspecified: Secondary | ICD-10-CM

## 2020-03-24 DIAGNOSIS — Z0001 Encounter for general adult medical examination with abnormal findings: Secondary | ICD-10-CM

## 2020-03-24 DIAGNOSIS — E282 Polycystic ovarian syndrome: Secondary | ICD-10-CM

## 2020-03-24 DIAGNOSIS — L732 Hidradenitis suppurativa: Secondary | ICD-10-CM

## 2020-03-24 NOTE — Patient Instructions (Signed)
F/U 2 months  We will plan for ozempic  We will call with lab results

## 2020-03-24 NOTE — Progress Notes (Signed)
Subjective:    Patient ID: Sonya Olson, female    DOB: 03-08-1981, 39 y.o.   MRN: [id number]  Patient presents for Annual Exam (is not fasting)   Pt here for annual exam  meds reviewed  December  31st  2019 was sleeve surgery   now 2 years out  over the past year she has gained 13lbs She has been snacking more, in the house due to her anxiety with COVID-19  Sheis using bariatric vitamins, she has not been using the protein shakes as much  Feels tired  All th etime  GAD- still has some anxiety in setting of pandemic and fear of illness, she is vaccinated, she is still on prozac and uses ativan sparingly  Hidranitis- she is still on infusion every 4 weeks and MTX weekly/folic acid   She had recent kidney stone- she was going to have retrieval done but it passed, she was on flomax  Needs refill norco   Follows with allergist   GYN- [facility] [place] /Mammo UTD   Review Of Systems:  GEN- denies fatigue, fever, weight loss,weakness, recent illness HEENT- denies eye drainage, change in vision, nasal discharge, CVS- denies chest pain, palpitations RESP- denies SOB, cough, wheeze ABD- denies N/V, change in stools, abd pain GU- denies dysuria, hematuria, dribbling, incontinence MSK- denies joint pain, muscle aches, injury Neuro- denies headache, dizziness, syncope, seizure activity       Objective:    BP 138/78   Pulse 80   Temp 98 F (36.7 C) (Temporal)   Resp 14   Ht 5\' 7"  (1.702 m)   Wt 263 lb (119.3 kg)   SpO2 97%   BMI 41.19 kg/m  GEN- NAD, alert and oriented x3 HEENT- PERRL, EOMI, non injected sclera, pink conjunctiva, MMM, oropharynx clear Neck- Supple, no thyromegaly CVS- RRR, no murmur RESP-CTAB ABD-NABS,soft,NT,ND EXT- No edema Pulses- Radial, DP- 2+        Assessment & Plan:      Problem List Items Addressed This Visit      Unprioritized   Class 3 obesity    Check vitamin stores S/p weight loss surgery Increase protein  She is  scheduled with bariatrician next month and dietician She is working out at a boot camp      Relevant Orders   Hemoglobin A1c (Completed)   Lipid panel (Completed)   Depression with anxiety    Continue current regimen      Hydradenitis    Continued on infusions Chronic pain with multiple surgeries  norco refilled      PCOS (polycystic ovarian syndrome)    Check A1C She would benefit from GLP-1 therapy to help treat insulin resistance and weight gain s/p sleeve gastrectomy       Relevant Orders   Hemoglobin A1c (Completed)    Other Visit Diagnoses    Routine general medical examination at a health care facility    -  Primary   CPE done, fasting labs obtained, immunizations - pt had COVID booster last week, will wait 1 week then get flu shot between her chemo infusions   Relevant Orders   TSH (Completed)   CBC with Differential/Platelet (Completed)   Comprehensive metabolic panel (Completed)   Hemoglobin A1c (Completed)   Lipid panel (Completed)   History of sleeve gastrectomy       Relevant Orders   Vitamin B12 (Completed)   Vitamin D, 25-hydroxy (Completed)   Iron, TIBC and Ferritin Panel (Completed)   Vitamin D deficiency  vitamin d too low s/p surgery , would prefer > 50, start high dose replacement       Note: This dictation was prepared with Dragon dictation along with smaller phrase technology. Any transcriptional errors that result from this process are unintentional.

## 2020-03-25 ENCOUNTER — Encounter: Payer: Self-pay | Admitting: Family Medicine

## 2020-03-25 LAB — CBC WITH DIFFERENTIAL/PLATELET
Absolute Monocytes: 515 cells/uL (ref 200–950)
Basophils Absolute: 31 cells/uL (ref 0–200)
Basophils Relative: 0.4 %
Eosinophils Absolute: 39 cells/uL (ref 15–500)
Eosinophils Relative: 0.5 %
HCT: 41.8 % (ref 35.0–45.0)
Hemoglobin: 13.6 g/dL (ref 11.7–15.5)
Lymphs Abs: 2902 cells/uL (ref 850–3900)
MCH: 26.3 pg — ABNORMAL LOW (ref 27.0–33.0)
MCHC: 32.5 g/dL (ref 32.0–36.0)
MCV: 80.7 fL (ref 80.0–100.0)
MPV: 10.5 fL (ref 7.5–12.5)
Monocytes Relative: 6.6 %
Neutro Abs: 4313 cells/uL (ref 1500–7800)
Neutrophils Relative %: 55.3 %
Platelets: 325 10*3/uL (ref 140–400)
RBC: 5.18 10*6/uL — ABNORMAL HIGH (ref 3.80–5.10)
RDW: 14.4 % (ref 11.0–15.0)
Total Lymphocyte: 37.2 %
WBC: 7.8 10*3/uL (ref 3.8–10.8)

## 2020-03-25 LAB — COMPREHENSIVE METABOLIC PANEL
AG Ratio: 1.3 (calc) (ref 1.0–2.5)
ALT: 18 U/L (ref 6–29)
AST: 15 U/L (ref 10–30)
Albumin: 4 g/dL (ref 3.6–5.1)
Alkaline phosphatase (APISO): 70 U/L (ref 31–125)
BUN: 10 mg/dL (ref 7–25)
CO2: 27 mmol/L (ref 20–32)
Calcium: 9 mg/dL (ref 8.6–10.2)
Chloride: 104 mmol/L (ref 98–110)
Creat: 0.64 mg/dL (ref 0.50–1.10)
Globulin: 3.2 g/dL (calc) (ref 1.9–3.7)
Glucose, Bld: 89 mg/dL (ref 65–99)
Potassium: 4.2 mmol/L (ref 3.5–5.3)
Sodium: 138 mmol/L (ref 135–146)
Total Bilirubin: 0.5 mg/dL (ref 0.2–1.2)
Total Protein: 7.2 g/dL (ref 6.1–8.1)

## 2020-03-25 LAB — HEMOGLOBIN A1C
Hgb A1c MFr Bld: 5.5 % of total Hgb (ref <5.7)
Mean Plasma Glucose: 111 (calc)
eAG (mmol/L): 6.2 (calc)

## 2020-03-25 LAB — IRON,TIBC AND FERRITIN PANEL
%SAT: 28 % (calc) (ref 16–45)
Ferritin: 106 ng/mL (ref 16–232)
Iron: 98 ug/dL (ref 40–190)
TIBC: 349 mcg/dL (calc) (ref 250–450)

## 2020-03-25 LAB — VITAMIN B12: Vitamin B-12: 511 pg/mL (ref 200–1100)

## 2020-03-25 LAB — VITAMIN D 25 HYDROXY (VIT D DEFICIENCY, FRACTURES): Vit D, 25-Hydroxy: 34 ng/mL (ref 30–100)

## 2020-03-25 LAB — LIPID PANEL
Cholesterol: 185 mg/dL (ref <200)
HDL: 58 mg/dL (ref >=50)
LDL Cholesterol (Calc): 106 mg/dL (calc) — ABNORMAL HIGH
Non-HDL Cholesterol (Calc): 127 mg/dL (calc) (ref <130)
Total CHOL/HDL Ratio: 3.2 (calc) (ref <5.0)
Triglycerides: 113 mg/dL (ref <150)

## 2020-03-25 LAB — TSH: TSH: 0.75 mIU/L

## 2020-03-25 MED ORDER — OZEMPIC (0.25 OR 0.5 MG/DOSE) 2 MG/1.5ML ~~LOC~~ SOPN: 0.2500 mg | PEN_INJECTOR | SUBCUTANEOUS | 2 refills | Status: DC

## 2020-03-25 NOTE — Assessment & Plan Note (Signed)
Continue current regimen

## 2020-03-25 NOTE — Assessment & Plan Note (Signed)
Continued on infusions Chronic pain with multiple surgeries  norco refilled

## 2020-03-25 NOTE — Assessment & Plan Note (Signed)
Check vitamin stores S/p weight loss surgery Increase protein  She is scheduled with bariatrician next month and dietician She is working out at a boot camp

## 2020-03-25 NOTE — Assessment & Plan Note (Signed)
Check A1C She would benefit from GLP-1 therapy to help treat insulin resistance and weight gain s/p sleeve gastrectomy

## 2020-03-30 ENCOUNTER — Encounter: Payer: Self-pay | Admitting: Family Medicine

## 2020-03-30 MED ORDER — VITAMIN D (ERGOCALCIFEROL) 1.25 MG (50000 UNIT) PO CAPS: 50000.0000 [IU] | ORAL_CAPSULE | ORAL | 0 refills | Status: DC

## 2020-04-06 ENCOUNTER — Other Ambulatory Visit: Payer: Self-pay | Admitting: Family Medicine

## 2020-04-06 MED ORDER — FLUOXETINE HCL 40 MG PO CAPS
40.0000 mg | ORAL_CAPSULE | Freq: Every day | ORAL | 0 refills | Status: DC
Start: 2020-04-06 — End: 2020-05-28

## 2020-04-06 MED ORDER — LORAZEPAM 0.5 MG PO TABS
0.5000 mg | ORAL_TABLET | Freq: Three times a day (TID) | ORAL | 2 refills | Status: DC
Start: 2020-04-06 — End: 2020-05-28

## 2020-04-06 MED ORDER — PANTOPRAZOLE SODIUM 40 MG PO TBEC
40.0000 mg | DELAYED_RELEASE_TABLET | Freq: Every day | ORAL | 0 refills | Status: DC
Start: 2020-04-06 — End: 2020-05-28

## 2020-04-06 NOTE — Telephone Encounter (Signed)
Ok to refill??  Last office visit 03/24/2020.  Last refill 03/10/2020.  Ok to add refills to prescription?

## 2020-05-28 ENCOUNTER — Other Ambulatory Visit: Payer: Self-pay

## 2020-05-28 ENCOUNTER — Encounter: Payer: Self-pay | Admitting: Family Medicine

## 2020-05-28 ENCOUNTER — Ambulatory Visit: Payer: BC Managed Care – PPO | Admitting: Family Medicine

## 2020-05-28 VITALS — BP 136/80 | HR 81 | Temp 97.4°F | Ht 67.0 in | Wt 264.0 lb

## 2020-05-28 DIAGNOSIS — F418 Other specified anxiety disorders: Secondary | ICD-10-CM | POA: Diagnosis not present

## 2020-05-28 DIAGNOSIS — E669 Obesity, unspecified: Secondary | ICD-10-CM

## 2020-05-28 DIAGNOSIS — E282 Polycystic ovarian syndrome: Secondary | ICD-10-CM

## 2020-05-28 DIAGNOSIS — K219 Gastro-esophageal reflux disease without esophagitis: Secondary | ICD-10-CM | POA: Diagnosis not present

## 2020-05-28 DIAGNOSIS — Z903 Acquired absence of stomach [part of]: Secondary | ICD-10-CM | POA: Insufficient documentation

## 2020-05-28 MED ORDER — OZEMPIC (0.25 OR 0.5 MG/DOSE) 2 MG/1.5ML ~~LOC~~ SOPN
0.5000 mg | PEN_INJECTOR | SUBCUTANEOUS | 2 refills | Status: DC
Start: 2020-05-28 — End: 2020-07-27

## 2020-05-28 MED ORDER — PANTOPRAZOLE SODIUM 40 MG PO TBEC
40.0000 mg | DELAYED_RELEASE_TABLET | Freq: Every day | ORAL | 1 refills | Status: DC
Start: 2020-05-28 — End: 2020-07-27

## 2020-05-28 MED ORDER — FLUOXETINE HCL 40 MG PO CAPS
ORAL_CAPSULE | ORAL | 1 refills | Status: DC
Start: 2020-05-28 — End: 2020-07-27

## 2020-05-28 MED ORDER — LORAZEPAM 0.5 MG PO TABS
0.5000 mg | ORAL_TABLET | Freq: Three times a day (TID) | ORAL | 2 refills | Status: DC
Start: 2020-05-28 — End: 2020-10-01

## 2020-05-28 MED ORDER — FLUOXETINE HCL 20 MG PO CAPS: ORAL_CAPSULE | ORAL | 1 refills | Status: DC

## 2020-05-28 NOTE — Progress Notes (Signed)
   Subjective:    Patient ID: Sonya Olson, female    DOB: October 24, 1980, 40 y.o.   MRN: [id number]  Patient presents for Anxiety and Depression   Pt here to f/u ozempic for weight loss She has not noticed no change in snacking appetite   trying to reduce sweets but has cravings for carbs  No GI upset with ozempic   Feb 15th has appt [facility] for sleeve reset with dietician and surgeon- its a Very low carb, high protein diet program that have with supplements   She is no longer in boot camp due to high COVID-19 numbers she is using youtube videos - 2 days   She is on weekly vitamin D now after recent labs abnormal   She is still on infusions for her hidrandenitis   GAD/MDD- she is not in therapy, he rprevious therapist has left the practice She is taking prozac 40mg  and ativan at bedtime, but feels she needs her ativan every nigt, she feels more on edge and thinks her meds need to increased   Review Of Systems:  GEN- denies fatigue, fever, weight loss,weakness, recent illness HEENT- denies eye drainage, change in vision, nasal discharge, CVS- denies chest pain, palpitations RESP- denies SOB, cough, wheeze ABD- denies N/V, change in stools, abd pain GU- denies dysuria, hematuria, dribbling, incontinence MSK- denies joint pain, muscle aches, injury Neuro- denies headache, dizziness, syncope, seizure activity       Objective:    BP 136/80   Pulse 81   Temp (!) 97.4 F (36.3 C)   Ht 5\' 7"  (1.702 m)   Wt 264 lb (119.7 kg)   SpO2 98%   BMI 41.35 kg/m  GEN- NAD, alert and oriented x3 HEENT- PERRL, EOMI, non injected sclera, pink conjunctiva, MMM, oropharynx clear Neck- Supple, no thyromegaly CVS- RRR, no murmur RESP-CTAB ABD-NABS,soft,NT,ND PSYCH normal affect and mood, not depressed appearing  EXT- No edema Pulses- Radial, DP- 2+        Assessment & Plan:      Problem List Items Addressed This Visit      Unprioritized   Class 3 obesity     Increase ozempic to  0.5mg  once a week Work on sugar reduction She has appt with bariatrics for sleeve reset program Increase physical activity to 3 days a week       Depression with anxiety    Increase prozac to 60mg  once a day She is looking for a new therapist Prn ativan  RTC in 2 months      Relevant Medications   LORazepam (ATIVAN) 0.5 MG tablet   FLUoxetine (PROZAC) 40 MG capsule   FLUoxetine (PROZAC) 20 MG capsule   GERD (gastroesophageal reflux disease)    protonix refilled       Relevant Medications   pantoprazole (PROTONIX) 40 MG tablet   H/O gastric sleeve   PCOS (polycystic ovarian syndrome) - Primary      Note: This dictation was prepared with Dragon dictation along with smaller phrase technology. Any transcriptional errors that result from this process are unintentional.

## 2020-05-28 NOTE — Patient Instructions (Addendum)
Increase ozempic to  0.5mg  once a week prozac increased to 60mg  once a day  F/u 2 MONTHS Dr.Pickard

## 2020-05-28 NOTE — Assessment & Plan Note (Addendum)
Increase prozac to 60mg  once a day She is looking for a new therapist Prn ativan  RTC in 2 months

## 2020-05-28 NOTE — Assessment & Plan Note (Addendum)
Increase ozempic to  0.5mg  once a week Work on sugar reduction She has appt with bariatrics for sleeve reset program Increase physical activity to 3 days a week

## 2020-05-28 NOTE — Assessment & Plan Note (Signed)
protonix refilled

## 2020-06-25 ENCOUNTER — Encounter: Payer: Self-pay | Admitting: Family Medicine

## 2020-06-25 LAB — HM MAMMOGRAPHY

## 2020-07-27 ENCOUNTER — Ambulatory Visit: Payer: Self-pay | Admitting: Family Medicine

## 2020-07-27 ENCOUNTER — Encounter: Payer: Self-pay | Admitting: Family Medicine

## 2020-07-27 ENCOUNTER — Ambulatory Visit: Payer: BC Managed Care – PPO | Admitting: Family Medicine

## 2020-07-27 ENCOUNTER — Other Ambulatory Visit: Payer: Self-pay

## 2020-07-27 VITALS — BP 118/64 | HR 96 | Temp 97.9°F | Resp 14 | Ht 67.0 in | Wt 258.0 lb

## 2020-07-27 DIAGNOSIS — F418 Other specified anxiety disorders: Secondary | ICD-10-CM

## 2020-07-27 DIAGNOSIS — Z903 Acquired absence of stomach [part of]: Secondary | ICD-10-CM | POA: Diagnosis not present

## 2020-07-27 DIAGNOSIS — E559 Vitamin D deficiency, unspecified: Secondary | ICD-10-CM | POA: Diagnosis not present

## 2020-07-27 DIAGNOSIS — E669 Obesity, unspecified: Secondary | ICD-10-CM | POA: Diagnosis not present

## 2020-07-27 MED ORDER — FLUOXETINE HCL 20 MG PO CAPS: ORAL_CAPSULE | ORAL | 3 refills | Status: DC

## 2020-07-27 MED ORDER — WEGOVY 1.7 MG/0.75ML ~~LOC~~ SOAJ: 1.7000 mg | SUBCUTANEOUS | 1 refills | Status: DC

## 2020-07-27 MED ORDER — PANTOPRAZOLE SODIUM 40 MG PO TBEC: 40.0000 mg | DELAYED_RELEASE_TABLET | Freq: Every day | ORAL | 3 refills | Status: DC

## 2020-07-27 MED ORDER — FLUOXETINE HCL 40 MG PO CAPS: ORAL_CAPSULE | ORAL | 3 refills | Status: DC

## 2020-07-27 NOTE — Progress Notes (Signed)
Subjective:    Patient ID: Sonya Olson, female    DOB: 10-08-1980, 40 y.o.   MRN: [id number]  HPI  Patient is a very pleasant 40 year old African-American female who is here today to establish care with me.  She has previously been seen my partner who is since retired from family practice.  Past medical history is significant for gastric bypass.  She is also on wegovy for medication assisted weight loss.  Approximately 3 to 4 weeks ago, the dose was increased to 1 mg a day.  She states that she is tolerating this dose well without any nausea or abdominal pain or bloating.  She has lost 6 pounds since the increase in the dose and she would be interested in increasing the dose further.  She denies any abdominal pain on the medication.  She is also seeing a bariatric clinic.  Past medical history significant for hidradenitis suppurativa for which she is on chronic immunosuppression with Remicade.  [facility] monitors her liver function test and she recently had a CMP that was within normal limits in February.  We last checked her vitamin D level in November.  After completing 12 weeks of 50,000 units of vitamin D per week, the patient's vitamin D level was 34.  Since that time she has been taking 2000 units a day however we have not checked her vitamin D since that time.  Lastly, Dr. Jeanice Lim recently increased her Prozac to 60 mg a day.  She states that this higher dose is working much better for her.  She feels like the depression and the anxiety are better controlled.  She is also using Ativan at night occasionally to help her sleep.  She only uses it when she needs it.  She will try to sleep on her own first and then take the medication only if necessary. Past Medical History:  Diagnosis Date  . Allergy   . Anxiety    Phreesia 03/23/2020  . Asthma   . Depression    Phreesia 03/23/2020  . Eczema   . GERD (gastroesophageal reflux disease)    Phreesia 03/23/2020  . Hydradenitis    on mtx and remicade  through [facility]  . Kidney stones 2008   last episode  . PCOS (polycystic ovarian syndrome)   . Sleep apnea    Phreesia 03/23/2020   Past Surgical History:  Procedure Laterality Date  . APPENDECTOMY    . LITHOTRIPSY  2008  . NECK SURGERY     Current Outpatient Medications on File Prior to Visit  Medication Sig Dispense Refill  . acetaminophen (TYLENOL) 325 MG tablet Take 650 mg by mouth every 6 (six) hours as needed for mild pain or headache.    . albuterol (PROVENTIL HFA;VENTOLIN HFA) 108 (90 Base) MCG/ACT inhaler Inhale 1-2 puffs into the lungs every 6 (six) hours as needed for wheezing or shortness of breath.    . cholecalciferol (VITAMIN D3) 25 MCG (1000 UNIT) tablet Take 2,000 Units by mouth daily.    Marland Kitchen desonide (DESOWEN) 0.05 % cream Apply 1 application topically daily as needed (rash).     . fluocinonide cream (LIDEX) 0.05 % Apply 1 application topically daily.    . folic acid (FOLVITE) 1 MG tablet Take 3 mg by mouth daily.    Marland Kitchen inFLIXimab in sodium chloride 0.9 % Inject 5 mg/kg into the vein every 30 (thirty) days. remicade    . levocetirizine (XYZAL) 5 MG tablet Take 1 tablet (5 mg total) by mouth every  evening. 90 tablet 3  . levonorgestrel-ethinyl estradiol (AVIANE,ALESSE,LESSINA) 0.1-20 MG-MCG tablet Take 1 tablet by mouth.    . loratadine (CLARITIN) 10 MG tablet Take 10 mg by mouth daily.    Marland Kitchen LORazepam (ATIVAN) 0.5 MG tablet Take 1 tablet (0.5 mg total) by mouth every 8 (eight) hours. 30 tablet 2  . methotrexate (RHEUMATREX) 2.5 MG tablet Take 6 tablets by mouth once a week. Takes on Saturdays    . montelukast (SINGULAIR) 10 MG tablet Take 10 mg by mouth at bedtime.    . Semaglutide-Weight Management (WEGOVY) 1 MG/0.5ML SOAJ Inject 1 mg into the skin.     No current facility-administered medications on file prior to visit.   Allergies  Allergen Reactions  . Bee Pollen   . Other     Grass, trees, pet dander, dust    . Strawberry Extract   . Golimumab Itching    Social History   Socioeconomic History  . Marital status: Married    Spouse name: Not on file  . Number of children: Not on file  . Years of education: Not on file  . Highest education level: Not on file  Occupational History  . Not on file  Tobacco Use  . Smoking status: Never Smoker  . Smokeless tobacco: Never Used  Substance and Sexual Activity  . Alcohol use: No  . Drug use: No  . Sexual activity: Yes    Partners: Male    Birth control/protection: Pill    Comment: ALESSE  Other Topics Concern  . Not on file  Social History Narrative   Entered 09/2017:   Married.   One child daughter age 66   Works at [employer] in [place]   Social Determinants of Corporate investment banker Strain: Not on [employer] Insecurity: Not on file  Transportation Needs: Not on file  Physical Activity: Not on file  Stress: Not on file  Social Connections: Not on file  Intimate Partner Violence: Not on file     Review of Systems  All other systems reviewed and are negative.      Objective:   Physical Exam Vitals reviewed.  Constitutional:      General: She is not in acute distress.    Appearance: She is obese. She is not ill-appearing, toxic-appearing or diaphoretic.  Cardiovascular:     Rate and Rhythm: Normal rate and regular rhythm.     Pulses: Normal pulses.     Heart sounds: Normal heart sounds. No murmur heard. No gallop.   Pulmonary:     Effort: Pulmonary effort is normal. No respiratory distress.     Breath sounds: Normal breath sounds. No stridor. No wheezing or rhonchi.  Abdominal:     General: Abdomen is flat. Bowel sounds are normal. There is no distension.     Palpations: Abdomen is soft.     Tenderness: There is no abdominal tenderness. There is no guarding or rebound.  Musculoskeletal:     Right lower leg: No edema.     Left lower leg: No edema.  Neurological:     Mental Status: She is alert.           Assessment & Plan:   Vitamin D deficiency - Plan: VITAMIN D 25 Hydroxy (Vit-D Deficiency, Fractures), CANCELED: COMPLETE METABOLIC PANEL WITH GFR  Class 3 obesity  H/O gastric sleeve  Depression with anxiety  We will increase the patient's Ozempic to 1.7 mg subcu weekly.  Reassess in 1 month.  If tolerated I would increase via telephone to 2.4 mg subcu weekly.  I will gladly refill her Prozac and I am glad that this seems to be helping her.  CMP was recently checked at [facility] so I will cancel that order but I would like to recheck a vitamin D level to ensure adequate replacement.  I will continue to refill her Ativan that she uses at night sparingly to help her sleep.  The remainder of her preventative care is up-to-date including her Pap smear and mammogram.

## 2020-07-28 LAB — VITAMIN D 25 HYDROXY (VIT D DEFICIENCY, FRACTURES): Vit D, 25-Hydroxy: 58 ng/mL (ref 30–100)

## 2020-08-06 ENCOUNTER — Encounter: Payer: Self-pay | Admitting: *Deleted

## 2020-09-06 ENCOUNTER — Other Ambulatory Visit: Payer: Self-pay | Admitting: Family Medicine

## 2020-09-06 ENCOUNTER — Other Ambulatory Visit: Payer: Self-pay

## 2020-09-06 ENCOUNTER — Ambulatory Visit: Payer: BC Managed Care – PPO | Admitting: Nurse Practitioner

## 2020-09-06 ENCOUNTER — Encounter: Payer: Self-pay | Admitting: Nurse Practitioner

## 2020-09-06 VITALS — BP 124/84 | HR 102 | Temp 98.2°F | Ht 67.0 in | Wt 253.0 lb

## 2020-09-06 DIAGNOSIS — M5442 Lumbago with sciatica, left side: Secondary | ICD-10-CM | POA: Diagnosis not present

## 2020-09-06 MED ORDER — PREDNISONE 20 MG PO TABS: 20.0000 mg | ORAL_TABLET | Freq: Every day | ORAL | 0 refills | Status: DC

## 2020-09-06 MED ORDER — CYCLOBENZAPRINE HCL 5 MG PO TABS: 5.0000 mg | ORAL_TABLET | Freq: Every evening | ORAL | 0 refills | Status: DC | PRN

## 2020-09-06 NOTE — Patient Instructions (Signed)

## 2020-09-06 NOTE — Progress Notes (Signed)
Subjective:    Patient ID: Sonya Olson, female    DOB: July 18, 1980, 40 y.o.   MRN: [id number]  HPI: Sonya Olson is a 40 y.o. female presenting for back pain.  Chief Complaint  Patient presents with  . Back Pain    Back pain on the left side, pain is radiating down left leg. Taking tylenol for the pain. Having the pain for one wk   BACK PAIN Duration: week - last Sunday > 7 days ago Mechanism of injury: unknown Location: Left Onset: gradual Severity: severe Quality:  aching Frequency: constant the past 2 days Radiation: L leg below the knee Aggravating factors: sitting for too long Alleviating factors: Tylenol extra strenght Status: worse Treatments attempted:  Tylenol Relief with NSAIDs?: no NSAIDs taken Nighttime pain:  yes Paresthesias / decreased sensation:  yes Bowel / bladder incontinence:  no Fevers:  no Dysuria / urinary frequency:  no  Allergies  Allergen Reactions  . Bee Pollen   . Other     Grass, trees, pet dander, dust    . Strawberry Extract   . Golimumab Itching    Outpatient Encounter Medications as of 09/06/2020  Medication Sig  . acetaminophen (TYLENOL) 325 MG tablet Take 650 mg by mouth every 6 (six) hours as needed for mild pain or headache.  . albuterol (PROVENTIL HFA;VENTOLIN HFA) 108 (90 Base) MCG/ACT inhaler Inhale 1-2 puffs into the lungs every 6 (six) hours as needed for wheezing or shortness of breath.  . cholecalciferol (VITAMIN D3) 25 MCG (1000 UNIT) tablet Take 2,000 Units by mouth daily.  . cyclobenzaprine (FLEXERIL) 5 MG tablet Take 1 tablet (5 mg total) by mouth at bedtime as needed for muscle spasms. Take first dose at night time and monitor for drowsiness.  If this medication makes you drowsy, do not take while driving or operating heavy machinery.  Marland Kitchen desonide (DESOWEN) 0.05 % cream Apply 1 application topically daily as needed (rash).   . fluocinonide cream (LIDEX) 0.05 % Apply 1 application topically daily.  Marland Kitchen FLUoxetine  (PROZAC) 20 MG capsule Take  60mg  once a day  . FLUoxetine (PROZAC) 40 MG capsule Take 60mg  once a day  . folic acid (FOLVITE) 1 MG tablet Take 3 mg by mouth daily.  inFLIXimab in sodium chloride 0.9 % Inject 5 mg/kg into the vein every 30 (thirty) days. remicade  . levocetirizine (XYZAL) 5 MG tablet Take 1 tablet (5 mg total) by mouth every evening.  levonorgestrel-ethinyl estradiol (AVIANE,ALESSE,LESSINA) 0.1-20 MG-MCG tablet Take 1 tablet by mouth.  . loratadine (CLARITIN) 10 MG tablet Take 10 mg by mouth daily.  Marland Kitchen LORazepam (ATIVAN) 0.5 MG tablet Take 1 tablet (0.5 mg total) by mouth every 8 (eight) hours.  . methotrexate (RHEUMATREX) 2.5 MG tablet Take 6 tablets by mouth once a week. Takes on Saturdays  . montelukast (SINGULAIR) 10 MG tablet Take 10 mg by mouth at bedtime.  . pantoprazole (PROTONIX) 40 MG tablet Take 1 tablet (40 mg total) by mouth daily.  . predniSONE (DELTASONE) 20 MG tablet Take 1 tablet (20 mg total) by mouth daily with breakfast. Take 40mg  on days 1-2. Take 30mg  on days 3-4. Take 20mg  on days 5-6. Take 10mg  on days 7-8. Take 5mg  on days 9-10, then stop.  . Semaglutide-Weight Management (WEGOVY) 1 MG/0.5ML SOAJ Inject 1 mg into the skin.  . Semaglutide-Weight Management (WEGOVY) 1.7 MG/0.75ML SOAJ Inject 1.7 mg into the skin once a week.  . Semaglutide-Weight Management (WEGOVY) 1.7 MG/0.75ML SOAJ  Inject 1.7 mg into the skin.   No facility-administered encounter medications on file as of 09/06/2020.    Patient Active Problem List   Diagnosis Date Noted  . H/O gastric sleeve 05/28/2020  . Cellulitis of right arm 03/05/2018  . Iron deficiency anemia 03/03/2018  . Atypical chest pain   . GERD (gastroesophageal reflux disease) 01/16/2018  . Depression with anxiety 06/20/2017  . Mild persistent asthma 11/12/2015  . Atopic dermatitis 11/12/2015  . Perennial and seasonal allergic rhinitis 01/06/2015  . Hydradenitis 01/06/2015  . OSA (obstructive sleep apnea)  01/06/2015  . Family history of premature coronary artery disease 01/06/2015  . Class 3 obesity 01/06/2015  . PCOS (polycystic ovarian syndrome) 08/28/2011    Past Medical History:  Diagnosis Date  . Allergy   . Anxiety    Phreesia 03/23/2020  . Asthma   . Depression    Phreesia 03/23/2020  . Eczema   . GERD (gastroesophageal reflux disease)    Phreesia 03/23/2020  . Hydradenitis    on mtx and remicade through [facility]  . Kidney stones 2008   last episode  . PCOS (polycystic ovarian syndrome)   . Sleep apnea    Phreesia 03/23/2020    Relevant past medical, surgical, family and social history reviewed and updated as indicated. Interim medical history since our last visit reviewed.  Review of Systems Per HPI unless specifically indicated above     Objective:    BP 124/84   Pulse (!) 102   Temp 98.2 F (36.8 C)   Ht 5\' 7"  (1.702 m)   Wt 253 lb (114.8 kg)   SpO2 98%   BMI 39.63 kg/m   Wt Readings from Last 3 Encounters:  09/06/20 253 lb (114.8 kg)  07/27/20 258 lb (117 kg)  05/28/20 264 lb (119.7 kg)    Physical Exam Vitals and nursing note reviewed.  Constitutional:      General: She is not in acute distress.    Appearance: Normal appearance. She is not toxic-appearing.  Eyes:     General: No scleral icterus.    Extraocular Movements: Extraocular movements intact.  Musculoskeletal:        General: Normal range of motion.     Cervical back: Normal, normal range of motion and neck supple. No rigidity.     Thoracic back: Normal.     Lumbar back: Spasms, tenderness and bony tenderness present.       Back:     Right lower leg: No edema.     Left lower leg: No edema.     Comments: Exquisitely tender to the area marked above   Skin:    General: Skin is warm and dry.     Capillary Refill: Capillary refill takes less than 2 seconds.     Coloration: Skin is not jaundiced or pale.     Findings: No erythema.  Neurological:     Mental Status: She is alert and  oriented to person, place, and time.     Gait: Gait normal.  Psychiatric:        Mood and Affect: Mood normal.        Behavior: Behavior normal.        Thought Content: Thought content normal.        Judgment: Judgment normal.       Assessment & Plan:  1. Acute left-sided low back pain with left-sided sciatica Acute.  No red flags in history or on examination today.  Given radiculopathy, question nerve  impingement.  Will start prednisone, as needed muscle relaxant, and place referral to physical therapy.  Cannot take NSAIDs due to gastric surgery. With no improvement, consider imaging.   - Ambulatory referral to Physical Therapy - predniSONE (DELTASONE) 20 MG tablet; Take 1 tablet (20 mg total) by mouth daily with breakfast. Take 40mg  on days 1-2. Take 30mg  on days 3-4. Take 20mg  on days 5-6. Take 10mg  on days 7-8. Take 5mg  on days 9-10, then stop.  Dispense: 21 tablet; Refill: 0 - cyclobenzaprine (FLEXERIL) 5 MG tablet; Take 1 tablet (5 mg total) by mouth at bedtime as needed for muscle spasms. Take first dose at night time and monitor for drowsiness.  If this medication makes you drowsy, do not take while driving or operating heavy machinery.  Dispense: 30 tablet; Refill: 0    Follow up plan: Return if symptoms worsen or fail to improve.

## 2020-09-15 ENCOUNTER — Ambulatory Visit (HOSPITAL_COMMUNITY): Payer: BC Managed Care – PPO | Attending: Nurse Practitioner | Admitting: Physical Therapy

## 2020-09-15 ENCOUNTER — Encounter (HOSPITAL_COMMUNITY): Payer: Self-pay | Admitting: Physical Therapy

## 2020-09-15 ENCOUNTER — Other Ambulatory Visit: Payer: Self-pay

## 2020-09-15 DIAGNOSIS — M6281 Muscle weakness (generalized): Secondary | ICD-10-CM | POA: Diagnosis present

## 2020-09-15 DIAGNOSIS — R2689 Other abnormalities of gait and mobility: Secondary | ICD-10-CM | POA: Diagnosis present

## 2020-09-15 DIAGNOSIS — R29898 Other symptoms and signs involving the musculoskeletal system: Secondary | ICD-10-CM

## 2020-09-15 DIAGNOSIS — M545 Low back pain, unspecified: Secondary | ICD-10-CM | POA: Diagnosis not present

## 2020-09-15 NOTE — Patient Instructions (Signed)
Access Code: [email or web address] URL: [email or web address] Date: 09/15/2020 Prepared by: Greig Castilla Ramelo Oetken  Exercises Standing Lumbar Extension - 5 x daily - 7 x weekly - 1-2 sets - 10 reps

## 2020-09-15 NOTE — Therapy (Signed)
[facility] [address] [place], [place], [address] Phone: [phone]   Fax:  [phone]  Physical Therapy Evaluation  Patient Details  Name: Sonya Olson MRN: [id number] Date of Birth: [birth year] Referring Provider (PT): Cathlean Marseilles NP   Encounter Date: 09/15/2020   PT End of Session - 09/15/20 1438    Visit Number 1    Number of Visits 12    Date for PT Re-Evaluation 10/27/20    Authorization Type BCBS (no auth, no vl)    Progress Note Due on Visit 10    PT Start Time 1440    PT Stop Time 1520    PT Time Calculation (min) 40 min    Activity Tolerance Patient tolerated treatment well    Behavior During Therapy [facility] for tasks assessed/performed           Past Medical History:  Diagnosis Date  . Allergy   . Anxiety    Phreesia 03/23/2020  . Asthma   . Depression    Phreesia 03/23/2020  . Eczema   . GERD (gastroesophageal reflux disease)    Phreesia 03/23/2020  . Hydradenitis    on mtx and remicade through [facility]  . Kidney stones 2008   last episode  . PCOS (polycystic ovarian syndrome)   . Sleep apnea    Phreesia 03/23/2020    Past Surgical History:  Procedure Laterality Date  . APPENDECTOMY    . LITHOTRIPSY  2008  . NECK SURGERY      There were no vitals filed for this visit.    Subjective Assessment - 09/15/20 1439    Subjective Patient is a 40 y.o. female who presents to physical therapy with c/o acute L LBP with L sciatica. Patient states symptoms go from low back into the bottom of her foot. She also states tightness in upper back left side. She states symptoms began about 3 weeks ago with insidious onset. Symptoms worsen with sitting and are worse in the morning. She states symptoms are throbbing. Stretching helps sometimes but overall nothing has really helped. Walking helps things to loosen up. Patient states her main goal is to decrease her pain. States no history of back pain.    Limitations  House hold activities;Walking;Standing;Sitting    Patient Stated Goals decrease pain    Currently in Pain? Yes    Pain Score 7     Pain Location Leg    Pain Orientation Left    Pain Descriptors / Indicators Aching    Pain Type Acute pain    Pain Onset 1 to 4 weeks ago    Pain Frequency Constant    Aggravating Factors  sitting    Pain Relieving Factors movement sometimes              [facility] PT Assessment - 09/15/20 0001      Assessment   Medical Diagnosis Acute L LBP with L sciatica    Referring Provider (PT) Cathlean Marseilles NP    Onset Date/Surgical Date 08/25/20    Prior Therapy none      Precautions   Precautions None      Restrictions   Weight Bearing Restrictions No      Balance Screen   Has the patient fallen in the past 6 months Yes    How many times? 1    Has the patient had a decrease in activity level because of a fear of falling?  No    Is the patient reluctant  to leave their home because of a fear of falling?  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      Cognition   Overall Cognitive Status Within Functional Limits for tasks assessed      Observation/Other Assessments   Observations Ambulates without AD    Focus on Therapeutic Outcomes (FOTO)  67% function      Sensation   Light Touch Appears Intact      ROM / Strength   AROM / PROM / Strength AROM;Strength      AROM   AROM Assessment Site Lumbar    Lumbar Flexion 0% limited, no change in symptoms    Lumbar Extension 25% limited, no change in symptoms    Lumbar - Right Side Bend 0% limited, no change in symptoms    Lumbar - Left Side Bend 0% limited, no change in symptoms    Lumbar - Right Rotation 0% limited, no change in symptoms    Lumbar - Left Rotation 0% limited, no change in symptoms      Strength   Strength Assessment Site Hip;Knee;Ankle    Right/Left Hip Right;Left    Right Hip  Flexion 3/5    Right Hip Extension 3/5    Right Hip ABduction 4-/5    Left Hip Flexion 4/5    Left Hip Extension 3/5    Left Hip ABduction 4-/5    Right/Left Knee Right;Left    Right Knee Flexion 5/5    Right Knee Extension 5/5    Left Knee Flexion 5/5    Left Knee Extension 5/5    Right/Left Ankle Right;Left    Right Ankle Dorsiflexion 5/5    Left Ankle Dorsiflexion 5/5      Palpation   Spinal mobility hypomobile thoracic and lower lumbar, hypermobile upper lumbar    Palpation comment increased lumbar lordosis, TTP lower lumbar paraspinals, L glute max, PSIS, sacrum                      Objective measurements completed on examination: See above findings.       [facility] Adult PT Treatment/Exercise - 09/15/20 0001      Exercises   Exercises Lumbar      Lumbar Exercises: Stretches   Standing Extension 10 reps    Standing Extension Limitations 2 sets                  PT Education - 09/15/20 1437    Education Details Patient educated on exam findings, POC, scope of PT    Person(s) Educated Patient    Methods Explanation;Demonstration    Comprehension Verbalized understanding            PT Short Term Goals - 09/15/20 1529      PT SHORT TERM GOAL #1   Title Patient will be independent with HEP in order to improve functional outcomes.    Time 3    Period Weeks    Status New    Target Date 10/06/20      PT SHORT TERM GOAL #2   Title Patient will report at least 25% improvement in symptoms for improved quality of life.    Time 3    Period Weeks    Status New    Target Date 10/06/20             PT Long  Term Goals - 09/15/20 1529      PT LONG TERM GOAL #1   Title Patient will report at least 75% improvement in symptoms for improved quality of life.    Time 6    Period Weeks    Status New    Target Date 10/27/20      PT LONG TERM GOAL #2   Title Patient will improve FOTO score by at least 15 points in order to indicate improved  tolerance to activity.    Time 6    Period Weeks    Status New    Target Date 10/27/20      PT LONG TERM GOAL #3   Title Patient will demonstrate at least 4+/5 strength in bilateral hips in order to have improved strength for chores.    Time 6    Period Weeks    Status New    Target Date 10/27/20                  Plan - 09/15/20 1525    Clinical Impression Statement Patient is a 40 y.o. female who presents to physical therapy with c/o acute L LBP with L sciatica. She presents with pain limited deficits in lumbar spine strength, ROM, endurance, postural impairments, spinal mobility and functional mobility with ADL. She is having to modify and restrict ADL as indicated by FOTO score as well as subjective information and objective measures which is affecting overall participation. Patient feels minimal/no change in symptoms following repeated lumbar extension but does show improvement in lumbar ROM. Patient will benefit from skilled physical therapy in order to improve function and reduce impairment.    Personal Factors and Comorbidities Comorbidity 3+    Comorbidities Anxiety, Back pain, BMI over 30,  Depression    Examination-Activity Limitations Sit;Bend;Lift;Transfers;Stand;Stairs    Examination-Participation Restrictions Community Activity;Cleaning;Occupation;Yard Work;Volunteer;Shop    Stability/Clinical Decision Making Evolving/Moderate complexity    Clinical Decision Making Moderate    Rehab Potential Good    PT Frequency 2x / week    PT Duration 6 weeks    PT Treatment/Interventions ADLs/Self Care Home Management;Aquatic Therapy;Cryotherapy;Electrical Stimulation;Iontophoresis 4mg /ml Dexamethasone;Moist Heat;Traction;Ultrasound;Parrafin;Fluidtherapy;Contrast Bath;DME Instruction;Gait training;Stair training;Functional mobility training;Therapeutic activities;Therapeutic exercise;Balance training;Neuromuscular re-education;Patient/family education;Orthotic Fit/Training;Manual  techniques;Passive range of motion;Dry needling;Energy conservation;Splinting;Taping;Spinal Manipulations;Joint Manipulations    PT Next Visit Plan f/u with extension bias, possibly trial SKTC if no change with extension, hip and core strengthening, possibly manual for pain/mobility to lumbar and glutes    PT Home Exercise Plan 5/18 repeated extension    Consulted and Agree with Plan of Care Patient           Patient will benefit from skilled therapeutic intervention in order to improve the following deficits and impairments:  Hypomobility,Obesity,Decreased strength,Pain,Decreased mobility,Increased muscle spasms,Decreased range of motion,Impaired perceived functional ability,Hypermobility,Impaired flexibility,Postural dysfunction  Visit Diagnosis: Low back pain, unspecified back pain laterality, unspecified chronicity, unspecified whether sciatica present  Muscle weakness (generalized)  Other abnormalities of gait and mobility  Other symptoms and signs involving the musculoskeletal system     Problem List Patient Active Problem List   Diagnosis Date Noted  . H/O gastric sleeve 05/28/2020  . Cellulitis of right arm 03/05/2018  . Iron deficiency anemia 03/03/2018  . Atypical chest pain   . GERD (gastroesophageal reflux disease) 01/16/2018  . Depression with anxiety 06/20/2017  . Mild persistent asthma 11/12/2015  . Atopic dermatitis 11/12/2015  . Perennial and seasonal allergic rhinitis 01/06/2015  . Hydradenitis 01/06/2015  . OSA (obstructive sleep apnea) 01/06/2015  .  Family history of premature coronary artery disease 01/06/2015  . Class 3 obesity 01/06/2015  . PCOS (polycystic ovarian syndrome) 08/28/2011    3:32 PM, 09/15/20 Wyman Songster PT, DPT Physical Therapist at [facility]   [facility] [address] [place], [place], [address] Phone: [phone]   Fax:  [phone]  Name: Sonya Olson MRN: [id number] Date of Birth: 04/22/1981

## 2020-09-16 ENCOUNTER — Ambulatory Visit (HOSPITAL_COMMUNITY): Payer: BC Managed Care – PPO

## 2020-09-16 ENCOUNTER — Encounter (HOSPITAL_COMMUNITY): Payer: Self-pay

## 2020-09-16 DIAGNOSIS — R29898 Other symptoms and signs involving the musculoskeletal system: Secondary | ICD-10-CM

## 2020-09-16 DIAGNOSIS — R2689 Other abnormalities of gait and mobility: Secondary | ICD-10-CM

## 2020-09-16 DIAGNOSIS — M6281 Muscle weakness (generalized): Secondary | ICD-10-CM

## 2020-09-16 DIAGNOSIS — M545 Low back pain, unspecified: Secondary | ICD-10-CM

## 2020-09-16 NOTE — Therapy (Signed)
[facility] [address] [place], [place], [address] Phone: [phone]   Fax:  [phone]  Physical Therapy Treatment  Patient Details  Name: Sonya Olson MRN: [id number] Date of Birth: 06-01-1980 Referring Provider (PT): Cathlean Marseilles NP   Encounter Date: 09/16/2020   PT End of Session - 09/16/20 1724    Visit Number 2    Number of Visits 12    Date for PT Re-Evaluation 10/27/20    Authorization Type [employer] (no auth, no vl)    Progress Note Due on Visit 10    PT Start Time 1721   Late arrival   PT Stop Time 1752    PT Time Calculation (min) 31 min    Activity Tolerance Patient tolerated treatment well    Behavior During Therapy [facility] for tasks assessed/performed           Past Medical History:  Diagnosis Date  . Allergy   . Anxiety    Phreesia 03/23/2020  . Asthma   . Depression    Phreesia 03/23/2020  . Eczema   . GERD (gastroesophageal reflux disease)    Phreesia 03/23/2020  . Hydradenitis    on mtx and remicade through [facility]  . Kidney stones 2008   last episode  . PCOS (polycystic ovarian syndrome)   . Sleep apnea    Phreesia 03/23/2020    Past Surgical History:  Procedure Laterality Date  . APPENDECTOMY    . LITHOTRIPSY  2008  . NECK SURGERY      There were no vitals filed for this visit.   Subjective Assessment - 09/16/20 1721    Subjective Pt reports she has began the extension exercise today, 4 sets and stated she continues to have lower back pain and radicular symptoms to foot down Lt lateral.    Patient Stated Goals decrease pain    Currently in Pain? Yes    Pain Score 8     Pain Location Back    Pain Orientation Lower    Pain Descriptors / Indicators Aching    Pain Type Acute pain    Pain Radiating Towards Lt lateral aspect ending at bottom of foot    Pain Onset 1 to 4 weeks ago    Pain Frequency Constant    Aggravating Factors  sitting    Pain Relieving Factors movement sometimes     Effect of Pain on Daily Activities limits moderate                             [facility] Adult PT Treatment/Exercise - 09/16/20 0001      Exercises   Exercises Lumbar      Lumbar Exercises: Stretches   Single Knee to Chest Stretch 3 reps;30 seconds    Single Knee to Chest Stretch Limitations towel assistance    Piriformis Stretch 2 reps;30 seconds    Piriformis Stretch Limitations seated      Lumbar Exercises: Supine   Ab Set 10 reps;3 seconds    AB Set Limitations paired with exhale    Bent Knee Raise 10 reps;3 seconds    Bridge 10 reps;3 seconds      Lumbar Exercises: Sidelying   Clam Both;10 reps;3 seconds                  PT Education - 09/16/20 1731    Education Details Reviewed goals, discussed compliance wiht HEP, pt stated no change with extension  bias    Person(s) Educated Patient    Methods Explanation;Demonstration    Comprehension Verbalized understanding;Returned demonstration            PT Short Term Goals - 09/15/20 1529      PT SHORT TERM GOAL #1   Title Patient will be independent with HEP in order to improve functional outcomes.    Time 3    Period Weeks    Status New    Target Date 10/06/20      PT SHORT TERM GOAL #2   Title Patient will report at least 25% improvement in symptoms for improved quality of life.    Time 3    Period Weeks    Status New    Target Date 10/06/20             PT Long Term Goals - 09/15/20 1529      PT LONG TERM GOAL #1   Title Patient will report at least 75% improvement in symptoms for improved quality of life.    Time 6    Period Weeks    Status New    Target Date 10/27/20      PT LONG TERM GOAL #2   Title Patient will improve FOTO score by at least 15 points in order to indicate improved tolerance to activity.    Time 6    Period Weeks    Status New    Target Date 10/27/20      PT LONG TERM GOAL #3   Title Patient will demonstrate at least 4+/5 strength in bilateral hips in  order to have improved strength for chores.    Time 6    Period Weeks    Status New    Target Date 10/27/20                 Plan - 09/16/20 1759    Clinical Impression Statement Reviewed goals, reviewed compliance with HEP, pt stated she has began the extension based exercises and no improvements.  Session focus on core and proximal strengthening exercises, pt able to demonstrate good form and mechanics following initial cueing.  Reports of good stretch with SKTC.  Some cueing for breathing with abdominal sets to improve sequence.  EOS pt reports pain reduced to 5/10.  Added core and proximal strenghtening exercises to HEP.    Personal Factors and Comorbidities Comorbidity 3+    Comorbidities Anxiety, Back pain, BMI over 30,  Depression    Examination-Activity Limitations Sit;Bend;Lift;Transfers;Stand;Stairs    Examination-Participation Restrictions Community Activity;Cleaning;Occupation;Yard Work;Volunteer;Shop    Stability/Clinical Decision Making Evolving/Moderate complexity    Clinical Decision Making Moderate    Rehab Potential Good    PT Frequency 2x / week    PT Duration 6 weeks    PT Treatment/Interventions ADLs/Self Care Home Management;Aquatic Therapy;Cryotherapy;Electrical Stimulation;Iontophoresis 4mg /ml Dexamethasone;Moist Heat;Traction;Ultrasound;Parrafin;Fluidtherapy;Contrast Bath;DME Instruction;Gait training;Stair training;Functional mobility training;Therapeutic activities;Therapeutic exercise;Balance training;Neuromuscular re-education;Patient/family education;Orthotic Fit/Training;Manual techniques;Passive range of motion;Dry needling;Energy conservation;Splinting;Taping;Spinal Manipulations;Joint Manipulations    PT Next Visit Plan f/u with extension bias, possibly trial SKTC if no change with extension, hip and core strengthening, possibly manual for pain/mobility to lumbar and glutes    PT Home Exercise Plan 5/18 repeated extension; 5/19: bridge, ab set, clam,  piriformis    Consulted and Agree with Plan of Care Patient           Patient will benefit from skilled therapeutic intervention in order to improve the following deficits and impairments:  Hypomobility,Obesity,Decreased strength,Pain,Decreased mobility,Increased muscle spasms,Decreased range of  motion,Impaired perceived functional ability,Hypermobility,Impaired flexibility,Postural dysfunction  Visit Diagnosis: Low back pain, unspecified back pain laterality, unspecified chronicity, unspecified whether sciatica present  Muscle weakness (generalized)  Other abnormalities of gait and mobility  Other symptoms and signs involving the musculoskeletal system     Problem List Patient Active Problem List   Diagnosis Date Noted  . H/O gastric sleeve 05/28/2020  . Cellulitis of right arm 03/05/2018  . Iron deficiency anemia 03/03/2018  . Atypical chest pain   . GERD (gastroesophageal reflux disease) 01/16/2018  . Depression with anxiety 06/20/2017  . Mild persistent asthma 11/12/2015  . Atopic dermatitis 11/12/2015  . Perennial and seasonal allergic rhinitis 01/06/2015  . Hydradenitis 01/06/2015  . OSA (obstructive sleep apnea) 01/06/2015  . Family history of premature coronary artery disease 01/06/2015  . Class 3 obesity 01/06/2015  . PCOS (polycystic ovarian syndrome) 08/28/2011   Becky Sax, LPTA/CLT; CBIS [phone]  Juel Burrow 09/16/2020, 6:04 PM  [place] [facility] [address] [place], [place], [address] Phone: [phone]   Fax:  [phone]  Name: Sonya Olson MRN: [id number] Date of Birth: [birth year]

## 2020-09-30 ENCOUNTER — Encounter: Payer: Self-pay | Admitting: Family Medicine

## 2020-10-01 MED ORDER — LORAZEPAM 0.5 MG PO TABS: 0.5000 mg | ORAL_TABLET | Freq: Three times a day (TID) | ORAL | 2 refills | Status: DC

## 2020-10-01 NOTE — Telephone Encounter (Signed)
Ok to refill??  Last office visit 09/06/2020.  Last refill 05/28/2020, #2 refills.

## 2020-10-05 ENCOUNTER — Ambulatory Visit (HOSPITAL_COMMUNITY): Payer: BC Managed Care – PPO

## 2020-10-07 ENCOUNTER — Ambulatory Visit (HOSPITAL_COMMUNITY): Payer: BC Managed Care – PPO | Attending: Nurse Practitioner

## 2020-10-07 ENCOUNTER — Telehealth (HOSPITAL_COMMUNITY): Payer: Self-pay

## 2020-10-07 NOTE — Telephone Encounter (Signed)
No show, called and left message concerning missed apt today.  Reminded next apt date and time wiht contact number included if needs to cancel/reschedule future apts.    Becky Sax, LPTA/CLT; [place] [phone]

## 2020-10-12 ENCOUNTER — Encounter (HOSPITAL_COMMUNITY): Payer: BC Managed Care – PPO | Admitting: Physical Therapy

## 2020-10-14 ENCOUNTER — Ambulatory Visit (HOSPITAL_COMMUNITY): Payer: BC Managed Care – PPO | Admitting: Physical Therapy

## 2020-10-19 ENCOUNTER — Encounter (HOSPITAL_COMMUNITY): Payer: BC Managed Care – PPO | Admitting: Physical Therapy

## 2020-10-21 ENCOUNTER — Encounter (HOSPITAL_COMMUNITY): Payer: BC Managed Care – PPO | Admitting: Physical Therapy

## 2020-10-26 ENCOUNTER — Encounter (HOSPITAL_COMMUNITY): Payer: BC Managed Care – PPO | Admitting: Physical Therapy

## 2020-10-28 ENCOUNTER — Encounter (HOSPITAL_COMMUNITY): Payer: BC Managed Care – PPO | Admitting: Physical Therapy

## 2021-03-03 ENCOUNTER — Other Ambulatory Visit: Payer: Self-pay | Admitting: Family Medicine

## 2021-03-04 ENCOUNTER — Other Ambulatory Visit: Payer: Self-pay

## 2021-03-04 NOTE — Telephone Encounter (Signed)
LOV for depression 05/28/20 Last refill 10/01/20, #30, 2 refills  Please advise. Thank you!

## 2021-04-26 ENCOUNTER — Telehealth: Payer: Self-pay

## 2021-04-27 NOTE — Telephone Encounter (Signed)
error 

## 2021-04-28 ENCOUNTER — Encounter: Payer: Self-pay | Admitting: Nurse Practitioner

## 2021-04-28 ENCOUNTER — Other Ambulatory Visit: Payer: Self-pay

## 2021-04-28 ENCOUNTER — Telehealth (INDEPENDENT_AMBULATORY_CARE_PROVIDER_SITE_OTHER): Payer: BC Managed Care – PPO | Admitting: Nurse Practitioner

## 2021-04-28 DIAGNOSIS — J069 Acute upper respiratory infection, unspecified: Secondary | ICD-10-CM

## 2021-04-28 DIAGNOSIS — J4521 Mild intermittent asthma with (acute) exacerbation: Secondary | ICD-10-CM | POA: Diagnosis not present

## 2021-04-28 MED ORDER — PREDNISONE 20 MG PO TABS: 40.0000 mg | ORAL_TABLET | Freq: Every day | ORAL | 0 refills | Status: AC

## 2021-04-28 MED ORDER — ALBUTEROL SULFATE HFA 108 (90 BASE) MCG/ACT IN AERS: 1.0000 | INHALATION_SPRAY | Freq: Four times a day (QID) | RESPIRATORY_TRACT | 3 refills | Status: AC | PRN

## 2021-04-28 NOTE — Progress Notes (Signed)
Subjective:    Patient ID: Sonya Olson, female    DOB: 1981/03/16, 40 y.o.   MRN: [id number]  HPI: Sonya Olson is a 40 y.o. female presenting virtually for cough and congestion.  Chief Complaint  Patient presents with   Cough   UPPER RESPIRATORY TRACT INFECTION Thinks she has bronchitis Onset: Monday 12/26 COVID-19 testing history: negative at home Fever: no Body aches: no Chills: no Cough:  yes;  productive Shortness of breath: no Wheezing: no Chest pain: no Chest tightness: no Chest congestion: yes Nasal congestion: yes Runny nose: yes Post nasal drip: yes Sneezing: no Sore throat: yes Swollen glands: no Sinus pressure: no Headache: yes Face pain: no Toothache: no Ear pain: no  Ear pressure: no  Eyes red/itching:no Eye drainage/crusting: no  Nausea: no  Vomiting: no Diarrhea: no  Change in appetite: yes ; decreased Loss of taste/smell: no  Rash: no Fatigue: yes Sick contacts: no Strep contacts: no  Context: stable Recurrent sinusitis: no Treatments attempted: cough syrup, Tylenol cold/flu, cough drops,  Relief with OTC medications: some  Allergies  Allergen Reactions   Bee Pollen    Other     Grass, trees, pet dander, dust     Strawberry Extract    Golimumab Itching    Outpatient Encounter Medications as of 04/28/2021  Medication Sig   LORazepam (ATIVAN) 0.5 MG tablet TAKE 1 TABLET BY MOUTH EVERY 8 HOURS.   predniSONE (DELTASONE) 20 MG tablet Take 2 tablets (40 mg total) by mouth daily with breakfast for 5 days.   acetaminophen (TYLENOL) 325 MG tablet Take 650 mg by mouth every 6 (six) hours as needed for mild pain or headache.   albuterol (VENTOLIN HFA) 108 (90 Base) MCG/ACT inhaler Inhale 1-2 puffs into the lungs every 6 (six) hours as needed for wheezing or shortness of breath.   cholecalciferol (VITAMIN D3) 25 MCG (1000 UNIT) tablet Take 2,000 Units by mouth daily.   desonide (DESOWEN) 0.05 % cream Apply 1 application topically  daily as needed (rash).    fluocinonide cream (LIDEX) 0.05 % Apply 1 application topically daily.   FLUoxetine (PROZAC) 40 MG capsule Take 60mg  once a day   folic acid (FOLVITE) 1 MG tablet Take 3 mg by mouth daily.   inFLIXimab in sodium chloride 0.9 % Inject 5 mg/kg into the vein every 30 (thirty) days. remicade   levocetirizine (XYZAL) 5 MG tablet Take 1 tablet (5 mg total) by mouth every evening.   levonorgestrel-ethinyl estradiol (AVIANE,ALESSE,LESSINA) 0.1-20 MG-MCG tablet Take 1 tablet by mouth.   loratadine (CLARITIN) 10 MG tablet Take 10 mg by mouth daily.   methotrexate (RHEUMATREX) 2.5 MG tablet Take 6 tablets by mouth once a week. Takes on Saturdays   montelukast (SINGULAIR) 10 MG tablet Take 10 mg by mouth at bedtime.   pantoprazole (PROTONIX) 40 MG tablet Take 1 tablet (40 mg total) by mouth daily.   traMADol (ULTRAM) 50 MG tablet Take 50 mg by mouth every 6 (six) hours as needed.   WEGOVY 2.4 MG/0.75ML SOAJ SMARTSIG:0.75 Milliliter(s) Topical Once a Week   [DISCONTINUED] albuterol (PROVENTIL HFA;VENTOLIN HFA) 108 (90 Base) MCG/ACT inhaler Inhale 1-2 puffs into the lungs every 6 (six) hours as needed for wheezing or shortness of breath.   [DISCONTINUED] cyclobenzaprine (FLEXERIL) 5 MG tablet Take 1 tablet (5 mg total) by mouth at bedtime as needed for muscle spasms. Take first dose at night time and monitor for drowsiness.  If this medication makes you drowsy, do not take  while driving or operating heavy machinery.   [DISCONTINUED] predniSONE (DELTASONE) 20 MG tablet Take 1 tablet (20 mg total) by mouth daily with breakfast. Take 40mg  on days 1-2. Take 30mg  on days 3-4. Take 20mg  on days 5-6. Take 10mg  on days 7-8. Take 5mg  on days 9-10, then stop.   No facility-administered encounter medications on file as of 04/28/2021.    Patient Active Problem List   Diagnosis Date Noted   H/O gastric sleeve 05/28/2020   Cellulitis of right arm 03/05/2018   Iron deficiency anemia 03/03/2018    Atypical chest pain    GERD (gastroesophageal reflux disease) 01/16/2018   Depression with anxiety 06/20/2017   Mild persistent asthma 11/12/2015   Atopic dermatitis 11/12/2015   Perennial and seasonal allergic rhinitis 01/06/2015   Hydradenitis 01/06/2015   OSA (obstructive sleep apnea) 01/06/2015   Family history of premature coronary artery disease 01/06/2015   Class 3 obesity (HCC) 01/06/2015   PCOS (polycystic ovarian syndrome) 08/28/2011    Past Medical History:  Diagnosis Date   Allergy    Anxiety    Phreesia 03/23/2020   Asthma    Depression    Phreesia 03/23/2020   Eczema    GERD (gastroesophageal reflux disease)    Phreesia 03/23/2020   Hydradenitis    on mtx and remicade [facility]   Kidney stones 2008   last episode   PCOS (polycystic ovarian syndrome)    Sleep apnea    Phreesia 03/23/2020    Relevant past medical, surgical, family and social history reviewed and updated as indicated. Interim medical history since our last visit reviewed.  Review of Systems Per HPI unless specifically indicated above     Objective:    There were no vitals taken for this visit.  Wt Readings from Last 3 Encounters:  09/06/20 253 lb (114.8 kg)  07/27/20 258 lb (117 kg)  05/28/20 264 lb (119.7 kg)    Physical Exam Vitals reviewed.  Constitutional:      General: She is not in acute distress.    Appearance: Normal appearance. She is not toxic-appearing.  HENT:     Head: Normocephalic and atraumatic.     Right Ear: External ear normal.     Left Ear: External ear normal.     Nose: Nose normal. No congestion.     Mouth/Throat:     Mouth: Mucous membranes are moist.     Pharynx: Oropharynx is clear.  Eyes:     General: No scleral icterus.       Right eye: No discharge.        Left eye: No discharge.  Cardiovascular:     Comments: Unable to assess heart sounds via virtual visit. Pulmonary:     Effort: Pulmonary effort is normal. No respiratory distress.      Comments: Unable to assess breath sounds via virtual visit.  Patient talking in complete sentences during telemedicine visit.  No accessory muscle use.  Frequent congested sounding cough over video. Skin:    Coloration: Skin is not jaundiced or pale.     Findings: No erythema.  Neurological:     Mental Status: She is alert and oriented to person, place, and time.      Assessment & Plan:  1. Mild intermittent asthma with exacerbation Refill given for albuterol inhaler to have on hand.  Start prednisone burst.  With any sudden onset new chest pain, dizziness, sweating, or shortness of breath, go to ED. if symptoms persist more than 7  to 10 days, return to clinic.  - albuterol (VENTOLIN HFA) 108 (90 Base) MCG/ACT inhaler; Inhale 1-2 puffs into the lungs every 6 (six) hours as needed for wheezing or shortness of breath.  Dispense: 1 each; Refill: 3 - predniSONE (DELTASONE) 20 MG tablet; Take 2 tablets (40 mg total) by mouth daily with breakfast for 5 days.  Dispense: 10 tablet; Refill: 0  2. Upper respiratory tract infection, unspecified type Acute.  Suspect asthma exacerbation related to upper respiratory virus.  Given history of asthma, coughing up purulent drainage, start prednisone.  Refill given for albuterol inhaler to use every 4-6 hours as needed for wheezing or shortness of breath.  Reassured patient that symptoms and exam findings are most consistent with a viral upper respiratory infection and explained lack of efficacy of antibiotics against viruses.  Discussed expected course and features suggestive of secondary bacterial infection.  Continue supportive care. Increase fluid intake with water or electrolyte solution like pedialyte. Encouraged acetaminophen as needed for fever/pain. Encouraged salt water gargling, chloraseptic spray and throat lozenges. Encouraged OTC guaifenesin. Encouraged saline sinus flushes and/or neti with humidified air.      Follow up plan: Return if symptoms  worsen or fail to improve.  Due to the catastrophic nature of the COVID-19 pandemic, this video visit was completed soley via audio and visual contact via [employer] due to the restrictions of the COVID-19 pandemic.  All issues as above were discussed and addressed. Physical exam was done as above through visual confirmation on [employer]. If it was felt that the patient should be evaluated in the office, they were directed there. The patient verbally consented to this visit. Location of the patient: home Location of the provider: work Those involved with this call:  Provider: Cathlean Marseilles, DNP, FNP-C CMA: n/a Front Desk/Registration: Claudine Mouton  Time spent on call:  10 minutes with patient face to face via video conference. More than 50% of this time was spent in counseling and coordination of care. 15 minutes total spent in review of patient's record and preparation of their chart. I verified patient identity using two factors (patient name and date of birth). Patient consents verbally to being seen via telemedicine visit today.

## 2021-06-03 ENCOUNTER — Other Ambulatory Visit: Payer: Self-pay

## 2021-06-03 ENCOUNTER — Other Ambulatory Visit: Payer: Self-pay | Admitting: Family Medicine

## 2021-06-03 NOTE — Telephone Encounter (Signed)
LOV w/PCP 07/27/20 Last refill 03/04/21, #30, 0 refills  Please review, thanks!

## 2021-08-03 ENCOUNTER — Other Ambulatory Visit: Payer: Self-pay | Admitting: Family Medicine

## 2021-08-06 ENCOUNTER — Other Ambulatory Visit: Payer: Self-pay

## 2021-08-06 ENCOUNTER — Emergency Department (HOSPITAL_COMMUNITY)
Admission: EM | Admit: 2021-08-06 | Discharge: 2021-08-06 | Disposition: A | Discharge status: Home / Self Care | Payer: BC Managed Care – PPO | Attending: Emergency Medicine | Admitting: Emergency Medicine

## 2021-08-06 ENCOUNTER — Emergency Department (HOSPITAL_COMMUNITY): Payer: BC Managed Care – PPO

## 2021-08-06 ENCOUNTER — Encounter (HOSPITAL_COMMUNITY): Payer: Self-pay | Admitting: *Deleted

## 2021-08-06 DIAGNOSIS — R079 Chest pain, unspecified: Secondary | ICD-10-CM | POA: Diagnosis present

## 2021-08-06 DIAGNOSIS — R0602 Shortness of breath: Secondary | ICD-10-CM | POA: Diagnosis not present

## 2021-08-06 DIAGNOSIS — R072 Precordial pain: Secondary | ICD-10-CM | POA: Diagnosis not present

## 2021-08-06 DIAGNOSIS — R61 Generalized hyperhidrosis: Secondary | ICD-10-CM | POA: Insufficient documentation

## 2021-08-06 DIAGNOSIS — R0789 Other chest pain: Secondary | ICD-10-CM

## 2021-08-06 LAB — BASIC METABOLIC PANEL
Anion gap: 6 (ref 5–15)
BUN: 12 mg/dL (ref 6–20)
CO2: 24 mmol/L (ref 22–32)
Calcium: 8.4 mg/dL — ABNORMAL LOW (ref 8.9–10.3)
Chloride: 106 mmol/L (ref 98–111)
Creatinine, Ser: 0.7 mg/dL (ref 0.44–1.00)
GFR, Estimated: >60 mL/min (ref >60)
Glucose, Bld: 123 mg/dL — ABNORMAL HIGH (ref 70–99)
Potassium: 3.7 mmol/L (ref 3.5–5.1)
Sodium: 136 mmol/L (ref 135–145)

## 2021-08-06 LAB — CBC
HCT: 37.2 % (ref 36.0–46.0)
Hemoglobin: 13.5 g/dL (ref 12.0–15.0)
MCH: 28 pg (ref 26.0–34.0)
MCHC: 36.3 g/dL — ABNORMAL HIGH (ref 30.0–36.0)
MCV: 77.2 fL — ABNORMAL LOW (ref 80.0–100.0)
Platelets: 256 10*3/uL (ref 150–400)
RBC: 4.82 MIL/uL (ref 3.87–5.11)
RDW: 14.4 % (ref 11.5–15.5)
WBC: 7.8 10*3/uL (ref 4.0–10.5)
nRBC: 0 % (ref 0.0–0.2)

## 2021-08-06 LAB — TROPONIN I (HIGH SENSITIVITY)
Troponin I (High Sensitivity): <2 ng/L (ref <18)
Troponin I (High Sensitivity): <2 ng/L (ref <18)

## 2021-08-06 LAB — I-STAT BETA HCG BLOOD, ED (MC, WL, AP ONLY): I-stat hCG, quantitative: <5 m[IU]/mL (ref <5)

## 2021-08-06 LAB — D-DIMER, QUANTITATIVE: D-Dimer, Quant: 0.33 ug/mL-FEU (ref 0.00–0.50)

## 2021-08-06 MED ORDER — HEPARIN SOD (PORK) LOCK FLUSH 100 UNIT/ML IV SOLN
500.0000 [IU] | Freq: Once | INTRAVENOUS | Status: AC
  Administered 2021-08-06: 500 [IU]
  Filled 2021-08-06: qty 5

## 2021-08-06 MED ORDER — ONDANSETRON HCL 4 MG/2ML IJ SOLN
4.0000 mg | Freq: Once | INTRAMUSCULAR | Status: AC
  Administered 2021-08-06: 4 mg via INTRAVENOUS
  Filled 2021-08-06: qty 2

## 2021-08-06 MED ORDER — HYDROMORPHONE HCL 1 MG/ML IJ SOLN
1.0000 mg | Freq: Once | INTRAMUSCULAR | Status: AC
  Administered 2021-08-06: 1 mg via INTRAVENOUS
  Filled 2021-08-06: qty 1

## 2021-08-06 MED ORDER — HYDROCODONE-ACETAMINOPHEN 5-325 MG PO TABS: 1.0000 | ORAL_TABLET | Freq: Four times a day (QID) | ORAL | 0 refills | Status: DC | PRN

## 2021-08-06 NOTE — ED Provider Notes (Addendum)
?[facility]-EMERGENCY DEPT ?Provider Note ? ? ?CSN: [id number] ?Arrival date & time: 08/06/21  1613 ? ?  ? ?History ? ?Chief Complaint  ?Patient presents with  ? Chest Pain  ? ? ?Sonya Olson is a 41 y.o. female. ? ?Patient with a complaint of left posterior chest pain has been constant since yesterday.  On the way in here today it moved anteriorly and associated with shortness of breath and diaphoresis.  Patient states she occasionally has some feet swelling.  But no significant leg swelling recently.  Pain not made worse or better by anything no fever or chills no cough or upper respiratory symptoms. ? 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  Patient's had her appendix removed.  Patient has a Port-A-Cath in the right anterior part of the chest that is used to receive methotrexate to help her hidradenitis. ? ? ?  ? ?Home Medications ?Prior to Admission medications   ?Medication Sig Start Date End Date Taking? Authorizing Provider  ?acetaminophen (TYLENOL) 325 MG tablet Take 650 mg by mouth every 6 (six) hours as needed for mild pain or headache.    [provider]  ?albuterol (VENTOLIN HFA) 108 (90 Base) MCG/ACT inhaler Inhale 1-2 puffs into the lungs every 6 (six) hours as needed for wheezing or shortness of breath. 04/28/21   Valentino Nose, NP  ?cholecalciferol (VITAMIN D3) 25 MCG (1000 UNIT) tablet Take 2,000 Units by mouth daily.    [provider]  ?desonide (DESOWEN) 0.05 % cream Apply 1 application topically daily as needed (rash).  10/20/14   [provider]  ?fluocinonide cream (LIDEX) 0.05 % Apply 1 application topically daily.    [provider]  ?FLUoxetine (PROZAC) 20 MG capsule Take 20 mg by mouth daily. 04/27/21   [provider]  ?folic acid (FOLVITE) 1 MG tablet Take 3 mg by mouth  daily. 11/19/19   [provider]  ?inFLIXimab in sodium chloride 0.9 % Inject 5 mg/kg into the vein every 30 (thirty) days. remicade    [provider]  ?levocetirizine (XYZAL) 5 MG tablet Take 1 tablet (5 mg total) by mouth every evening. 09/26/17   Salley Scarlet, MD  ?levonorgestrel-ethinyl estradiol (AVIANE,ALESSE,LESSINA) 0.1-20 MG-MCG tablet Take 1 tablet by mouth.    [provider]  ?loratadine (CLARITIN) 10 MG tablet Take 10 mg by mouth daily.    [provider]  ?LORazepam (ATIVAN) 0.5 MG tablet TAKE 1 TABLET BY MOUTH EVERY 8 HOURS. 06/03/21   Donita Brooks, MD  ?methotrexate (RHEUMATREX) 2.5 MG tablet Take 6 tablets by mouth once a week. Takes on Saturdays 02/18/18   [provider]  ?montelukast (SINGULAIR) 10 MG tablet Take 10 mg by mouth at bedtime.    [provider]  ?pantoprazole (PROTONIX) 40 MG tablet TAKE 1 TABLET BY MOUTH ONCE A DAY. 08/04/21   Donita Brooks, MD  ?REMICADE 100 MG injection Inject into the vein. 05/23/21   [provider]  ?traMADol (ULTRAM) 50 MG tablet Take 50 mg by mouth every 6 (six) hours as needed. 12/13/20   [provider]  ?   ? ?Allergies    ?Bee pollen, Other, Strawberry extract, and Golimumab   ? ?Review of Systems   ?Review of Systems  ?Constitutional:  Positive for diaphoresis. Negative for chills and fever.  ?HENT:  Negative for ear pain and sore throat.   ?  Eyes:  Negative for pain and visual disturbance.  ?Respiratory:  Positive for shortness of breath. Negative for cough.   ?Cardiovascular:  Positive for chest pain. Negative for palpitations and leg swelling.  ?Gastrointestinal:  Negative for abdominal pain and vomiting.  ?Genitourinary:  Negative for dysuria and hematuria.  ?Musculoskeletal:  Positive for back pain. Negative for arthralgias.  ?Skin:  Negative for color change and rash.  ?Neurological:  Negative for seizures and syncope.  ?All other systems reviewed and are  negative. ? ?Physical Exam ?Updated Vital Signs ?BP (!) 163/87 (BP Location: Left Arm)   Pulse 88   Temp 98.6 ?F (37 ?C) (Oral)   Resp 16   Ht 1.702 m (5\' 7" )   Wt 113.4 kg   SpO2 100%   BMI 39.16 kg/m?  ?Physical Exam ?Vitals and nursing note reviewed.  ?Constitutional:   ?   General: She is not in acute distress. ?   Appearance: Normal appearance. She is well-developed. She is not ill-appearing or diaphoretic.  ?HENT:  ?   Head: Normocephalic and atraumatic.  ?Eyes:  ?   Extraocular Movements: Extraocular movements intact.  ?   Conjunctiva/sclera: Conjunctivae normal.  ?   Pupils: Pupils are equal, round, and reactive to light.  ?Cardiovascular:  ?   Rate and Rhythm: Normal rate and regular rhythm.  ?   Heart sounds: No murmur heard. ?Pulmonary:  ?   Effort: Pulmonary effort is normal. No respiratory distress.  ?   Breath sounds: Normal breath sounds. No wheezing, rhonchi or rales.  ?Chest:  ?   Chest wall: No tenderness.  ?Abdominal:  ?   Palpations: Abdomen is soft.  ?   Tenderness: There is no abdominal tenderness.  ?Musculoskeletal:     ?   General: No swelling.  ?   Cervical back: Normal range of motion and neck supple.  ?Skin: ?   General: Skin is warm and dry.  ?   Capillary Refill: Capillary refill takes less than 2 seconds.  ?Neurological:  ?   General: No focal deficit present.  ?   Mental Status: She is alert and oriented to person, place, and time.  ?   Cranial Nerves: No cranial nerve deficit.  ?   Sensory: No sensory deficit.  ?Psychiatric:     ?   Mood and Affect: Mood normal.  ? ? ?ED Results / Procedures / Treatments   ?Labs ?(all labs ordered are listed, but only abnormal results are displayed) ?Labs Reviewed  ?CBC - Abnormal; Notable for the following components:  ?    Result Value  ? MCV 77.2 (*)   ? MCHC 36.3 (*)   ? All other components within normal limits  ?BASIC METABOLIC PANEL  ?I-STAT BETA HCG BLOOD, ED ([facility], [facility], [facility] ONLY)  ?TROPONIN I (HIGH SENSITIVITY)  ? ? ?EKG ?EKG  Interpretation ? ?Date/Time:  Saturday August 06 2021 16:19:58 EDT ?Ventricular Rate:  90 ?PR Interval:  147 ?QRS Duration: 85 ?QT Interval:  378 ?QTC Calculation: 463 ?R Axis:   55 ?Text Interpretation: Sinus rhythm Confirmed by Vanetta MuldersZackowski, Jamiyla Ishee [phone]([id number]) on 08/06/2021 4:21:40 PM ? ?Radiology ?DG Chest 2 View ? ?Result Date: 08/06/2021 ?CLINICAL DATA:  Left-sided chest pain EXAM: CHEST - 2 VIEW COMPARISON:  03/03/2018 FINDINGS: The heart size and mediastinal contours are within normal limits. Right chest port catheter. Both lungs are clear. The visualized skeletal structures are unremarkable. IMPRESSION: No acute abnormality of the lungs. Electronically Signed   By: Bonna GainsAlex D Bibbey M.D.  On: 08/06/2021 16:41   ? ?Procedures ?Procedures  ? ? ?Medications Ordered in ED ?Medications - No data to display ? ?ED Course/ Medical Decision Making/ A&P ?  ?                        ?Medical Decision Making ?Amount and/or Complexity of Data Reviewed ?Labs: ordered. ?Radiology: ordered. ? ? ?Patient with 1 day history of pain to the left posterior upper back area.  Which shortly prior to arrival here radiated to the front.  Associated with shortness of breath and diaphoresis. ? ?Chest x-ray without any acute findings.  Right chest Port-A-Cath is there. ? ?EKG normal sinus rhythm.  Cardiac monitoring without arrhythmia. ? ?CBC basic metabolic panel and troponins are pending. ? ?Patient's troponins x2 are normal pregnancy test negative basic metabolic panel normal CBC normal D-dimer normal at 0.33 so no concerns for pulmonary embolus CT angio of the chest not indicated. ? ?Suspect may be a musculoskeletal chest pain.  Will treat symptomatically.  Have patient follow-up with her provider. ? ? ?Final Clinical Impression(s) / ED Diagnoses ?Final diagnoses:  ?Precordial pain  ? ? ?Rx / DC Orders ?ED Discharge Orders   ? ? None  ? ?  ? ? ?  ?Vanetta Mulders, MD ?08/06/21 1719 ? ?  ?Vanetta Mulders, MD ?08/06/21 1951 ? ?

## 2021-08-06 NOTE — ED Triage Notes (Signed)
Upper mid back followed by chest pain with sweating and shob, denies N/V ?

## 2021-08-06 NOTE — Discharge Instructions (Addendum)
Take the hydrocodone as directed.  Make an appointment follow-up with cardiology as well as her primary care doctor.  Work-up here today reassuring for this being an acute cardiac event currently.  And screen for possible blood clots in the lungs was negative and the chest x-ray was negative.  May be musculoskeletal.  But since you are over 40 would recommend following up with cardiology. ?

## 2021-08-09 ENCOUNTER — Ambulatory Visit (INDEPENDENT_AMBULATORY_CARE_PROVIDER_SITE_OTHER): Payer: BC Managed Care – PPO | Admitting: Family Medicine

## 2021-08-09 VITALS — BP 140/98 | HR 93 | Temp 98.1°F | Wt 252.4 lb

## 2021-08-09 DIAGNOSIS — M62838 Other muscle spasm: Secondary | ICD-10-CM

## 2021-08-09 DIAGNOSIS — E559 Vitamin D deficiency, unspecified: Secondary | ICD-10-CM | POA: Diagnosis not present

## 2021-08-09 MED ORDER — TIZANIDINE HCL 4 MG PO TABS: 4.0000 mg | ORAL_TABLET | Freq: Four times a day (QID) | ORAL | 0 refills | Status: DC | PRN

## 2021-08-09 NOTE — Progress Notes (Signed)
? ?Subjective:  ? ? Patient ID: Sonya Olson, female    DOB: [birth year], 41 y.o.   MRN: [id number] ? ?HPI ?Patient is a very pleasant 41 year old African-American female who presents today complaining of pain in between her shoulder blades.  She states the pain began around April 8 for no reason.  The pain radiated into her chest.  She started feeling hot and diaphoretic.  She went to the emergency room where troponins were negative x2.  Chest x-ray was negative.  EKG was normal.  D-dimer was reassuring.  She was diagnosed with musculoskeletal pain.  She is here today for follow-up.  She continues to report pain just medial to the right scapula.  She is tender to palpation in that area with palpable muscle spasms.  The pain radiates up into the left side of the neck near the insertion of the trapezius on the occiput.  She is certainly tender in that area.  There is no tenderness at the costochondral junction.  She denies any shortness of breath or chest pain or dyspnea on exertion or hemoptysis or cough.  She denies any GI component. ?Past Medical History:  ?Diagnosis Date  ? Allergy   ? Anxiety   ? [employer]rees[employer] 03/23/2020  ? Asthma   ? Depression   ? [employer] 03/23/2020  ? Eczema   ? GERD (gastroesophageal reflux disease)   ? [employer] 03/23/2020  ? Hydradenitis   ? on [facility]tx [facility]icade [facility]  ? Kidney stones 2008  ? last episode  ? PCOS (polycystic ovarian syndrome)   ? Sleep apnea   ? [employer] 03/23/2020  ? ?Past Surgical History:  ?Procedure Laterality Date  ? APPENDECTOMY    ? LITHOTRIPSY  2008  ? NECK SURGERY    ? ?Current Outpatient Medications on File Prior to Visit  ?Medication Sig Dispense Refill  ? acetaminophen (TYLENOL) 325 MG tablet Take 650 mg by mouth every 6 (six) hours as needed for mild pain or headache.    ? albuterol (VENTOLIN HFA) 108 (90 Base) MCG/ACT inhaler Inhale 1-2 puffs into the lungs every 6 (six) hours as needed for wheezing or shortness of breath. 1 each 3  ? cholecalciferol  (VITAMIN D3) 25 MCG (1000 UNIT) tablet Take 2,000 Units by mouth daily.    ? fluocinonide cream (LIDEX) 0.05 % Apply 1 application topically daily.    ? FLUoxetine (PROZAC) 20 MG capsule Take 20 mg by mouth daily.    ? folic acid (FOLVITE) 1 MG tablet Take 3 mg by mouth daily.    ? HYDROcodone-acetaminophen (NORCO/VICODIN) 5-325 MG tablet Take 1 tablet by mouth every 6 (six) hours as needed for moderate pain. 14 tablet 0  ? inFLIXimab in sodium chloride 0.9 % Inject 5 mg/kg into the vein every 30 (thirty) days. remicade    ? levocetirizine (XYZAL) 5 MG tablet Take 1 tablet (5 mg total) by mouth every evening. 90 tablet 3  ? levonorgestrel-ethinyl estradiol (AVIANE,ALESSE,LESSINA) 0.1-20 MG-MCG tablet Take 1 tablet by mouth.    ? loratadine (CLARITIN) 10 MG tablet Take 10 mg by mouth daily.    ? LORazepam (ATIVAN) 0.5 MG tablet TAKE 1 TABLET BY MOUTH EVERY 8 HOURS. 30 tablet 0  ? methotrexate (RHEUMATREX) 2.5 MG tablet Take 6 tablets by mouth once a week. Takes on Saturdays    ? montelukast (SINGULAIR) 10 MG tablet Take 10 mg by mouth at bedtime.    ? pantoprazole (PROTONIX) 40 MG tablet TAKE 1 TABLET BY MOUTH ONCE A  DAY. 90 tablet 0  ? REMICADE 100 MG injection Inject into the vein.    ? traMADol (ULTRAM) 50 MG tablet Take 50 mg by mouth every 6 (six) hours as needed.    ? ?No current facility-administered medications on file prior to visit.  ? ?Allergies  ?Allergen Reactions  ? Bee Pollen   ? Other   ?  Grass, trees, pet dander, dust ? ?  ? Strawberry Extract   ? Golimumab Itching  ? ?Social History  ? ?Socioeconomic History  ? Marital status: Married  ?  Spouse name: Not on file  ? Number of children: Not on file  ? Years of education: Not on file  ? Highest education level: Not on file  ?Occupational History  ? Not on file  ?Tobacco Use  ? Smoking status: Never  ? Smokeless tobacco: Never  ?Vaping Use  ? Vaping Use: Never used  ?Substance and Sexual Activity  ? Alcohol use: No  ? Drug use: No  ? Sexual activity:  Yes  ?  Partners: Male  ?  Birth control/protection: Pill  ?Other Topics Concern  ? Not on file  ?Social History Narrative  ? Entered 09/2017:  ? Married.  ? One child daughter age 71  ? Works at [employer] in [employer]  ? ?Social Determinants of Health  ? ?Financial Resource Strain: Not on file  ?Food Insecurity: Not on file  ?Transportation Needs: Not on file  ?Physical Activity: Not on file  ?Stress: Not on file  ?Social Connections: Not on file  ?Intimate Partner Violence: Not on file  ? ? ? ? ? ? ? ?Review of Systems  ?All other systems reviewed and are negative. ? ?   ?Objective:  ? Physical Exam ?Vitals reviewed.  ?Constitutional:   ?   General: She is not in acute distress. ?   Appearance: She is obese. She is not ill-appearing, toxic-appearing or diaphoretic.  ?Cardiovascular:  ?   Rate and Rhythm: Normal rate and regular rhythm.  ?   Pulses: Normal pulses.  ?   Heart sounds: Normal heart sounds. No murmur heard. ?  No gallop.  ?Pulmonary:  ?   Effort: Pulmonary effort is normal. No respiratory distress.  ?   Breath sounds: Normal breath sounds. No stridor. No wheezing or rhonchi.  ? ? ?Abdominal:  ?   General: Abdomen is flat. Bowel sounds are normal. There is no distension.  ?   Palpations: Abdomen is soft.  ?   Tenderness: There is no abdominal tenderness. There is no guarding or rebound.  ?Musculoskeletal:  ?   Right lower leg: No edema.  ?   Left lower leg: No edema.  ?Neurological:  ?   Mental Status: She is alert.  ? ? ? ? ? ?   ?Assessment & Plan:  ?Vitamin D deficiency - Plan: VITAMIN D 25 Hydroxy (Vit-D Deficiency, Fractures) ? ?Muscle spasm ?Based on her exam and history I believe this is musculoskeletal.  I will start the patient on Zanaflex 4 mg every 6 hours and then reassess the patient in 1 week to see if it is improving.  While we are here today the patient would like to recheck her vitamin D level as she has a history of vitamin D deficiency. ?

## 2021-08-10 LAB — VITAMIN D 25 HYDROXY (VIT D DEFICIENCY, FRACTURES): Vit D, 25-Hydroxy: 27 ng/mL — ABNORMAL LOW (ref 30–100)

## 2021-08-12 ENCOUNTER — Ambulatory Visit: Payer: BC Managed Care – PPO | Admitting: Family Medicine

## 2021-08-18 ENCOUNTER — Encounter: Payer: Self-pay | Admitting: Family Medicine

## 2021-08-19 ENCOUNTER — Other Ambulatory Visit: Payer: Self-pay | Admitting: Family Medicine

## 2021-08-22 ENCOUNTER — Ambulatory Visit: Payer: BC Managed Care – PPO | Admitting: Family Medicine

## 2021-08-22 ENCOUNTER — Telehealth: Payer: Self-pay

## 2021-08-22 VITALS — BP 126/92 | HR 84 | Temp 98.2°F | Ht 67.0 in | Wt 257.0 lb

## 2021-08-22 DIAGNOSIS — R300 Dysuria: Secondary | ICD-10-CM

## 2021-08-22 DIAGNOSIS — E559 Vitamin D deficiency, unspecified: Secondary | ICD-10-CM | POA: Diagnosis not present

## 2021-08-22 DIAGNOSIS — Z13220 Encounter for screening for lipoid disorders: Secondary | ICD-10-CM

## 2021-08-22 LAB — URINALYSIS, ROUTINE W REFLEX MICROSCOPIC
Glucose, UA: NEGATIVE
Hgb urine dipstick: NEGATIVE
Hyaline Cast: NONE SEEN /LPF
Nitrite: NEGATIVE
RBC / HPF: NONE SEEN /HPF (ref 0–2)
Specific Gravity, Urine: 1.029 (ref 1.001–1.035)
pH: 8.5 — ABNORMAL HIGH (ref 5.0–8.0)

## 2021-08-22 LAB — MICROSCOPIC MESSAGE

## 2021-08-22 MED ORDER — CEPHALEXIN 500 MG PO CAPS: 500.0000 mg | ORAL_CAPSULE | Freq: Three times a day (TID) | ORAL | 0 refills | Status: DC

## 2021-08-22 MED ORDER — FLUCONAZOLE 150 MG PO TABS: 150.0000 mg | ORAL_TABLET | Freq: Once | ORAL | 0 refills | Status: AC

## 2021-08-22 MED ORDER — PANTOPRAZOLE SODIUM 40 MG PO TBEC
40.0000 mg | DELAYED_RELEASE_TABLET | Freq: Every day | ORAL | 3 refills | Status: DC
Start: 2021-08-22 — End: 2021-11-21

## 2021-08-22 MED ORDER — FLUOXETINE HCL 20 MG PO TABS: 60.0000 mg | ORAL_TABLET | Freq: Every day | ORAL | 3 refills | Status: DC

## 2021-08-22 MED ORDER — VITAMIN D (ERGOCALCIFEROL) 1.25 MG (50000 UNIT) PO CAPS: 50000.0000 [IU] | ORAL_CAPSULE | ORAL | 5 refills | Status: DC

## 2021-08-22 NOTE — Telephone Encounter (Signed)
Pt came in to office inquiring about an outstanding balance that she has. Pt stated that she has made a payment already at this office and doesn't understand why she still has a balanced due. Please advise. ? ?Cb#:  ?

## 2021-08-22 NOTE — Progress Notes (Signed)
? ?Subjective:  ? ? Patient ID: Sonya Olson, female    DOB: [birth year], 41 y.o.   MRN: [id number] ? ?HPI ?08/09/21 ?Patient is a very pleasant 41 year old African-American female who presents today complaining of pain in between her shoulder blades.  She states the pain began around April 8 for no reason.  The pain radiated into her chest.  She started feeling hot and diaphoretic.  She went to the emergency room where troponins were negative x2.  Chest x-ray was negative.  EKG was normal.  D-dimer was reassuring.  She was diagnosed with musculoskeletal pain.  She is here today for follow-up.  She continues to report pain just medial to the right scapula.  She is tender to palpation in that area with palpable muscle spasms.  The pain radiates up into the left side of the neck near the insertion of the trapezius on the occiput.  She is certainly tender in that area.  There is no tenderness at the costochondral junction.  She denies any shortness of breath or chest pain or dyspnea on exertion or hemoptysis or cough.  She denies any GI component.  At that time, my plan was ? ?Based on her exam and history I believe this is musculoskeletal.  I will start the patient on Zanaflex 4 mg every 6 hours and then reassess the patient in 1 week to see if it is improving.  While we are here today the patient would like to recheck her vitamin D level as she has a history of vitamin D deficiency. ? ?08/22/21 ?Patient reports 2 weeks of dysuria, frequency, and urgency.  Urinalysis today shows +1 bilirubin, +1 ketones, +1 protein negative blood, negative nitrates, but +2 leukocyte esterase.  Symptoms sound consistent with a urinary tract infection.  She would also like to get her pantoprazole as well as her Prozac refill while she is here.  She has been on Prozac now for several years.  She feels that she benefits from the medication however she has to take 60 mg total.  She is taking 40 and 20.  She has a difficult time getting her  refills due to the 2 different doses.  People get confused and failed to refill the medication and there is usually some type of conflict with pharmacy.  We discussed switching to 20 mg tablets taking 3 a day so that she can get all of it refilled at 1 time and she would like to do this.  Also her recent vitamin D was found to be low.  She has been unable to locate vitamin D 50,000 units.  Therefore she needs a prescription for this.  She also request a prescription for pantoprazole.  She has been on this medication ever since her bariatric surgery.  Without the medication, she usually has severe heartburn within 3 days.  She is overdue for fasting lipid panel as well as a fasting blood sugar.  Recent random blood sugar was elevated at 123 ?Past Medical History:  ?Diagnosis Date  ? Allergy   ? Anxiety   ? [employer]rees[employer] 03/23/2020  ? Asthma   ? Depression   ? [employer]rees[employer] 03/23/2020  ? Eczema   ? GERD (gastroesophageal reflux disease)   ? [employer] 03/23/2020  ? Hydradenitis   ? on [facility]tx [facility]icade [facility]  ? Kidney stones 2008  ? last episode  ? PCOS (polycystic ovarian syndrome)   ? Sleep apnea   ? [employer]reesia 03/23/2020  ? ?Past Surgical History:  ?Procedure  Laterality Date  ? APPENDECTOMY    ? LITHOTRIPSY  2008  ? NECK SURGERY    ? ?Current Outpatient Medications on File Prior to Visit  ?Medication Sig Dispense Refill  ? acetaminophen (TYLENOL) 325 MG tablet Take 650 mg by mouth every 6 (six) hours as needed for mild pain or headache.    ? albuterol (VENTOLIN HFA) 108 (90 Base) MCG/ACT inhaler Inhale 1-2 puffs into the lungs every 6 (six) hours as needed for wheezing or shortness of breath. 1 each 3  ? cholecalciferol (VITAMIN D3) 25 MCG (1000 UNIT) tablet Take 2,000 Units by mouth daily.    ? fluocinonide cream (LIDEX) 0.05 % Apply 1 application topically daily.    ? FLUoxetine (PROZAC) 40 MG capsule TAKE 1 CAPSULE BY MOUTH DAILY WITH 20MG . 90 capsule 0  ? folic acid (FOLVITE) 1 MG tablet Take 3 mg by mouth daily.     ? HYDROcodone-acetaminophen (NORCO/VICODIN) 5-325 MG tablet Take 1 tablet by mouth every 6 (six) hours as needed for moderate pain. 14 tablet 0  ? inFLIXimab in sodium chloride 0.9 % Inject 5 mg/kg into the vein every 30 (thirty) days. remicade    ? levocetirizine (XYZAL) 5 MG tablet Take 1 tablet (5 mg total) by mouth every evening. 90 tablet 3  ? levonorgestrel-ethinyl estradiol (AVIANE,ALESSE,LESSINA) 0.1-20 MG-MCG tablet Take 1 tablet by mouth.    ? loratadine (CLARITIN) 10 MG tablet Take 10 mg by mouth daily.    ? LORazepam (ATIVAN) 0.5 MG tablet TAKE 1 TABLET BY MOUTH EVERY 8 HOURS. 30 tablet 0  ? methotrexate (RHEUMATREX) 2.5 MG tablet Take 6 tablets by mouth once a week. Takes on Saturdays    ? montelukast (SINGULAIR) 10 MG tablet Take 10 mg by mouth at bedtime.    ? pantoprazole (PROTONIX) 40 MG tablet TAKE 1 TABLET BY MOUTH ONCE A DAY. 90 tablet 0  ? REMICADE 100 MG injection Inject into the vein.    ? tiZANidine (ZANAFLEX) 4 MG tablet Take 1 tablet (4 mg total) by mouth every 6 (six) hours as needed for muscle spasms. 30 tablet 0  ? traMADol (ULTRAM) 50 MG tablet Take 50 mg by mouth every 6 (six) hours as needed.    ? ?No current facility-administered medications on file prior to visit.  ? ?Allergies  ?Allergen Reactions  ? Bee Pollen   ? Other   ?  Grass, trees, pet dander, dust ? ?  ? Strawberry Extract   ? Golimumab Itching  ? ?Social History  ? ?Socioeconomic History  ? Marital status: Married  ?  Spouse name: Not on file  ? Number of children: Not on file  ? Years of education: Not on file  ? Highest education level: Not on file  ?Occupational History  ? Not on file  ?Tobacco Use  ? Smoking status: Never  ? Smokeless tobacco: Never  ?Vaping Use  ? Vaping Use: Never used  ?Substance and Sexual Activity  ? Alcohol use: No  ? Drug use: No  ? Sexual activity: Yes  ?  Partners: Male  ?  Birth control/protection: Pill  ?Other Topics Concern  ? Not on file  ?Social History Narrative  ? Entered 09/2017:  ?  Married.  ? One child daughter age 80  ? Works at [employer] in [employer]  ? ?Social Determinants of Health  ? ?Financial Resource Strain: Not on file  ?Food Insecurity: Not on file  ?Transportation Needs: Not on file  ?Physical Activity:  Not on file  ?Stress: Not on file  ?Social Connections: Not on file  ?Intimate Partner Violence: Not on file  ? ? ? ? ? ? ? ?Review of Systems  ?All other systems reviewed and are negative. ? ?   ?Objective:  ? Physical Exam ?Vitals reviewed.  ?Constitutional:   ?   General: She is not in acute distress. ?   Appearance: She is obese. She is not ill-appearing, toxic-appearing or diaphoretic.  ?Cardiovascular:  ?   Rate and Rhythm: Normal rate and regular rhythm.  ?   Pulses: Normal pulses.  ?   Heart sounds: Normal heart sounds. No murmur heard. ?  No gallop.  ?Pulmonary:  ?   Effort: Pulmonary effort is normal. No respiratory distress.  ?   Breath sounds: Normal breath sounds. No stridor. No wheezing or rhonchi.  ?Neurological:  ?   Mental Status: She is alert.  ? ? ? ? ? ?   ?Assessment & Plan:  ?Dysuria - Plan: Urinalysis, Routine w reflex microscopic ? ?Screening cholesterol level - Plan: BASIC METABOLIC PANEL WITH GFR, Lipid panel ? ?Vitamin D deficiency ?Patient appears to have a urinary tract infection.  Begin Keflex 500 mg p.o. 3 times daily for 1 week.  I like her to return fasting for a BMP and a lipid panel to check her sugar and her cholesterol fasting.  She has vitamin D deficiency so I will prescribe vitamin D 50,000 units once weekly.  She will take this for 6 months and then recheck.  We will continue pantoprazole 40 mg a day for resistant acid reflux because the patient requires the medication and is symptomatic without the medication.  I will also refill her Prozac but I will switch her to 20 mg, 3 tablets daily to equal 60 mg a day so that she can get the medication the same time once a month without having to struggle to get 2 different  prescriptions filled. ?

## 2021-09-06 NOTE — Telephone Encounter (Signed)
After reviewing patients account it looks as if she paid $25 on 08/09/21 of which $10 was applied to DOS 07/27/20 and $15 to DOS 09/06/2020. She still has an outstanding balance of $15.00 for DOS 07/27/20 $5 and 04/28/21 $10.00. I have called and spoke with patient and advised her of the payment that was posted and where the additional $15 was coming from [facility] 07/27/20 $5, and 04/28/21 $10.00. Patient verbalized understanding and will call back on Friday to pay the remainder of her bill.  ?

## 2021-09-13 ENCOUNTER — Other Ambulatory Visit: Payer: Self-pay | Admitting: Family Medicine

## 2021-09-14 ENCOUNTER — Other Ambulatory Visit: Payer: Self-pay

## 2021-09-14 NOTE — Telephone Encounter (Signed)
LOV 08/22/21 ?Last refill 06/03/21, #30, 0 refills ? ?Please review, thanks! ? ?

## 2021-09-15 MED ORDER — LORAZEPAM 0.5 MG PO TABS: 0.5000 mg | ORAL_TABLET | Freq: Three times a day (TID) | ORAL | 0 refills | Status: DC

## 2021-11-21 ENCOUNTER — Ambulatory Visit: Payer: BC Managed Care – PPO | Admitting: Family Medicine

## 2021-11-21 VITALS — BP 126/78 | HR 87 | Temp 98.5°F | Ht 67.0 in | Wt 256.0 lb

## 2021-11-21 DIAGNOSIS — R300 Dysuria: Secondary | ICD-10-CM | POA: Diagnosis not present

## 2021-11-21 LAB — URINALYSIS, ROUTINE W REFLEX MICROSCOPIC
Casts: NONE SEEN /LPF
Crystals: NONE SEEN /HPF
Glucose, UA: NEGATIVE
Hyaline Cast: NONE SEEN /LPF
Nitrite: NEGATIVE
Specific Gravity, Urine: 1.02 (ref 1.001–1.035)
Yeast: NONE SEEN /HPF
pH: 6 (ref 5.0–8.0)

## 2021-11-21 LAB — MICROSCOPIC MESSAGE

## 2021-11-21 MED ORDER — CEPHALEXIN 500 MG PO CAPS: 500.0000 mg | ORAL_CAPSULE | Freq: Three times a day (TID) | ORAL | 0 refills | Status: DC

## 2021-11-21 MED ORDER — PANTOPRAZOLE SODIUM 40 MG PO TBEC: 40.0000 mg | DELAYED_RELEASE_TABLET | Freq: Every day | ORAL | 3 refills | Status: DC

## 2021-11-21 NOTE — Progress Notes (Signed)
Subjective:    Patient ID: Sonya Olson, female    DOB: Aug 04, 1980, 41 y.o.   MRN: [id number]  HPI Patient reports increased urinary frequency and urgency.  She states that she goes to the restroom and feels like she has to urinate but when she does nothing comes out.  She has some mild dysuria.  She also has some vague lower back pain.  She denies any fevers or chills.  She denies any hematuria.  Urinalysis shows +1 leukocyte esterase, trace blood, negative nitrates  Past Medical History:  Diagnosis Date   Allergy    Anxiety    [facility] 03/23/2020   Asthma    Depression    [facility] 03/23/2020   Eczema    GERD (gastroesophageal reflux disease)    [facility] 03/23/2020   Hydradenitis    on mtx and remicade [facility]   Kidney stones 2008   last episode   PCOS (polycystic ovarian syndrome)    Sleep apnea    [facility] 03/23/2020   Past Surgical History:  Procedure Laterality Date   APPENDECTOMY     LITHOTRIPSY  2008   NECK SURGERY     Current Outpatient Medications on File Prior to Visit  Medication Sig Dispense Refill   acetaminophen (TYLENOL) 325 MG tablet Take 650 mg by mouth every 6 (six) hours as needed for mild pain or headache.     albuterol (VENTOLIN HFA) 108 (90 Base) MCG/ACT inhaler Inhale 1-2 puffs into the lungs every 6 (six) hours as needed for wheezing or shortness of breath. 1 each 3   cholecalciferol (VITAMIN D3) 25 MCG (1000 UNIT) tablet Take 2,000 Units by mouth daily.     fluocinonide cream (LIDEX) 0.05 % Apply 1 application topically daily.     FLUoxetine (PROZAC) 20 MG capsule Take 60 mg by mouth daily.     folic acid (FOLVITE) 1 MG tablet Take 3 mg by mouth daily.     HYDROcodone-acetaminophen (NORCO/VICODIN) 5-325 MG tablet Take 1 tablet by mouth every 6 (six) hours as needed for moderate pain. 14 tablet 0   inFLIXimab in sodium chloride 0.9 % Inject 5 mg/kg into the vein every 30 (thirty) days. remicade     levocetirizine (XYZAL) 5 MG tablet Take  1 tablet (5 mg total) by mouth every evening. 90 tablet 3   levonorgestrel-ethinyl estradiol (AVIANE,ALESSE,LESSINA) 0.1-20 MG-MCG tablet Take 1 tablet by mouth.     loratadine (CLARITIN) 10 MG tablet Take 10 mg by mouth daily.     LORazepam (ATIVAN) 0.5 MG tablet Take 1 tablet (0.5 mg total) by mouth every 8 (eight) hours. 30 tablet 0   methotrexate (RHEUMATREX) 2.5 MG tablet Take 6 tablets by mouth once a week. Takes on Saturdays     montelukast (SINGULAIR) 10 MG tablet Take 10 mg by mouth at bedtime.     pantoprazole (PROTONIX) 40 MG tablet Take 1 tablet (40 mg total) by mouth daily. 90 tablet 3   REMICADE 100 MG injection Inject into the vein.     tiZANidine (ZANAFLEX) 4 MG tablet Take 1 tablet (4 mg total) by mouth every 6 (six) hours as needed for muscle spasms. 30 tablet 0   traMADol (ULTRAM) 50 MG tablet Take 50 mg by mouth every 6 (six) hours as needed.     Vitamin D, Ergocalciferol, (DRISDOL) 1.25 MG (50000 UNIT) CAPS capsule Take 1 capsule (50,000 Units total) by mouth every 7 (seven) days. 4 capsule 5   WEGOVY 2.4 MG/0.75ML SOAJ SMARTSIG:0.75 Milliliter(s)  SUB-Q Once a Week     No current facility-administered medications on file prior to visit.   Allergies  Allergen Reactions   Bee Pollen    Other     Grass, trees, pet dander, dust     Strawberry Extract    Golimumab Itching   Social History   Socioeconomic History   Marital status: Married    Spouse name: Not on file   Number of children: Not on file   Years of education: Not on file   Highest education level: Not on file  Occupational History   Not on file  Tobacco Use   Smoking status: Never   Smokeless tobacco: Never  Vaping Use   Vaping Use: Never used  Substance and Sexual Activity   Alcohol use: No   Drug use: No   Sexual activity: Yes    Partners: Male    Birth control/protection: Pill  Other Topics Concern   Not on file  Social History Narrative   Entered 09/2017:   Married.   One child  daughter age 75   Works at [employer] in [place]   Social Determinants of Corporate investment banker Strain: Not on [employer] Insecurity: Not on file  Transportation Needs: Not on file  Physical Activity: Not on file  Stress: Not on file  Social Connections: Not on file  Intimate Partner Violence: Not on file         Review of Systems  All other systems reviewed and are negative.      Objective:   Physical Exam Vitals reviewed.  Constitutional:      General: She is not in acute distress.    Appearance: She is obese. She is not ill-appearing, toxic-appearing or diaphoretic.  Cardiovascular:     Rate and Rhythm: Normal rate and regular rhythm.     Pulses: Normal pulses.     Heart sounds: Normal heart sounds. No murmur heard.    No gallop.  Pulmonary:     Effort: Pulmonary effort is normal. No respiratory distress.     Breath sounds: Normal breath sounds. No stridor. No wheezing or rhonchi.  Neurological:     Mental Status: She is alert.           Assessment & Plan:  Dysuria - Plan: Urinalysis, Routine w reflex microscopic, Urinalysis, Routine w reflex microscopic I suspect UTI.  Begin Keflex 500 mg 3 times a day for 5 days.  Recheck if no better in 5 days or sooner if worsening

## 2021-12-20 ENCOUNTER — Other Ambulatory Visit: Payer: Self-pay | Admitting: Family Medicine

## 2021-12-20 ENCOUNTER — Ambulatory Visit: Payer: BC Managed Care – PPO | Admitting: Family Medicine

## 2021-12-20 ENCOUNTER — Telehealth: Payer: Self-pay

## 2021-12-20 VITALS — BP 120/80 | HR 113 | Temp 100.0°F | Ht 67.0 in | Wt 257.6 lb

## 2021-12-20 DIAGNOSIS — R52 Pain, unspecified: Secondary | ICD-10-CM

## 2021-12-20 LAB — URINALYSIS, ROUTINE W REFLEX MICROSCOPIC
Bacteria, UA: NONE SEEN /HPF
Bilirubin Urine: NEGATIVE
Glucose, UA: NEGATIVE
Hyaline Cast: NONE SEEN /LPF
Ketones, ur: NEGATIVE
Leukocytes,Ua: NEGATIVE
Nitrite: NEGATIVE
Specific Gravity, Urine: 1.02 (ref 1.001–1.035)
pH: 7 (ref 5.0–8.0)

## 2021-12-20 LAB — MICROSCOPIC MESSAGE

## 2021-12-20 MED ORDER — MOLNUPIRAVIR EUA 200MG CAPSULE: 4.0000 | ORAL_CAPSULE | Freq: Two times a day (BID) | ORAL | 0 refills | Status: DC

## 2021-12-20 MED ORDER — MOLNUPIRAVIR EUA 200MG CAPSULE: 4.0000 | ORAL_CAPSULE | Freq: Two times a day (BID) | ORAL | 0 refills | Status: AC

## 2021-12-20 MED ORDER — ONDANSETRON HCL 4 MG PO TABS: 4.0000 mg | ORAL_TABLET | Freq: Three times a day (TID) | ORAL | 0 refills | Status: DC | PRN

## 2021-12-20 NOTE — Telephone Encounter (Signed)
Pt's husband called in to ask if the prescription that was just given to pt at ov today be [facility] [address] In [place]. Pt stated that the current pharmacy was out of that med. Please advise.  Cb#: [phone]

## 2021-12-20 NOTE — Progress Notes (Signed)
Subjective:    Patient ID: Sonya Olson, female    DOB: 06/13/1980, 41 y.o.   MRN: [id number]  HPI Immunosuppressed due to treatment for hidradenitis.  Patient presents with a 2-day history of fever, diffuse body aches, cough, and flulike symptoms.  COVID test here is negative.  She denies any chest pain or shortness of breath.  She denies any vomiting but she does have nausea.  She denies any diarrhea.  She denies any dysuria.  Urinalysis shows no evidence of urinary tract infection.  She denies any rash or tick bites.  She denies any abdominal pain.  She denies any sore throat or sinus pain.  She does have a pounding bitemporal headache.  She denies any meningismus. Past Medical History:  Diagnosis Date   Allergy    Anxiety    [facility] 03/23/2020   Asthma    Depression    [facility] 03/23/2020   Eczema    GERD (gastroesophageal reflux disease)    [facility] 03/23/2020   Hydradenitis    on mtx and remicade [facility]   Kidney stones 2008   last episode   PCOS (polycystic ovarian syndrome)    Sleep apnea    [facility] 03/23/2020   Past Surgical History:  Procedure Laterality Date   APPENDECTOMY     LITHOTRIPSY  2008   NECK SURGERY     Current Outpatient Medications on File Prior to Visit  Medication Sig Dispense Refill   acetaminophen (TYLENOL) 325 MG tablet Take 650 mg by mouth every 6 (six) hours as needed for mild pain or headache.     albuterol (VENTOLIN HFA) 108 (90 Base) MCG/ACT inhaler Inhale 1-2 puffs into the lungs every 6 (six) hours as needed for wheezing or shortness of breath. 1 each 3   cephALEXin (KEFLEX) 500 MG capsule Take 1 capsule (500 mg total) by mouth 3 (three) times daily. 21 capsule 0   cholecalciferol (VITAMIN D3) 25 MCG (1000 UNIT) tablet Take 2,000 Units by mouth daily.     fluocinonide cream (LIDEX) 0.05 % Apply 1 application topically daily.     FLUoxetine (PROZAC) 20 MG capsule Take 60 mg by mouth daily.     folic acid (FOLVITE) 1 MG tablet  Take 3 mg by mouth daily.     HYDROcodone-acetaminophen (NORCO/VICODIN) 5-325 MG tablet Take 1 tablet by mouth every 6 (six) hours as needed for moderate pain. 14 tablet 0   inFLIXimab in sodium chloride 0.9 % Inject 5 mg/kg into the vein every 30 (thirty) days. remicade     levocetirizine (XYZAL) 5 MG tablet Take 1 tablet (5 mg total) by mouth every evening. 90 tablet 3   levonorgestrel-ethinyl estradiol (AVIANE,ALESSE,LESSINA) 0.1-20 MG-MCG tablet Take 1 tablet by mouth.     loratadine (CLARITIN) 10 MG tablet Take 10 mg by mouth daily.     LORazepam (ATIVAN) 0.5 MG tablet Take 1 tablet (0.5 mg total) by mouth every 8 (eight) hours. 30 tablet 0   methotrexate (RHEUMATREX) 2.5 MG tablet Take 6 tablets by mouth once a week. Takes on Saturdays     montelukast (SINGULAIR) 10 MG tablet Take 10 mg by mouth at bedtime.     pantoprazole (PROTONIX) 40 MG tablet Take 1 tablet (40 mg total) by mouth daily. 90 tablet 3   REMICADE 100 MG injection Inject into the vein.     tiZANidine (ZANAFLEX) 4 MG tablet Take 1 tablet (4 mg total) by mouth every 6 (six) hours as needed for muscle spasms. 30  tablet 0   traMADol (ULTRAM) 50 MG tablet Take 50 mg by mouth every 6 (six) hours as needed.     Vitamin D, Ergocalciferol, (DRISDOL) 1.25 MG (50000 UNIT) CAPS capsule Take 1 capsule (50,000 Units total) by mouth every 7 (seven) days. 4 capsule 5   WEGOVY 2.4 MG/0.75ML SOAJ SMARTSIG:0.75 Milliliter(s) SUB-Q Once a Week     No current facility-administered medications on file prior to visit.   Allergies  Allergen Reactions   Bee Pollen    Other     Grass, trees, pet dander, dust     Strawberry Extract    Golimumab Itching   Social History   Socioeconomic History   Marital status: Married    Spouse name: Not on file   Number of children: Not on file   Years of education: Not on file   Highest education level: Not on file  Occupational History   Not on file  Tobacco Use   Smoking status: Never    Smokeless tobacco: Never  Vaping Use   Vaping Use: Never used  Substance and Sexual Activity   Alcohol use: No   Drug use: No   Sexual activity: Yes    Partners: Male    Birth control/protection: Pill  Other Topics Concern   Not on file  Social History Narrative   Entered 09/2017:   Married.   One child daughter age 34   Works at [employer] in [place]   Social Determinants of Corporate investment banker Strain: Not on [employer] Insecurity: Not on file  Transportation Needs: Not on file  Physical Activity: Not on file  Stress: Not on file  Social Connections: Not on file  Intimate Partner Violence: Not on file         Review of Systems  All other systems reviewed and are negative.      Objective:   Physical Exam Vitals reviewed.  Constitutional:      General: She is not in acute distress.    Appearance: She is obese. She is not ill-appearing, toxic-appearing or diaphoretic.  HENT:     Right Ear: Tympanic membrane and ear canal normal.     Left Ear: Tympanic membrane and ear canal normal.     Nose: Congestion present.     Mouth/Throat:     Pharynx: No oropharyngeal exudate or posterior oropharyngeal erythema.  Eyes:     Conjunctiva/sclera: Conjunctivae normal.  Cardiovascular:     Rate and Rhythm: Regular rhythm. Tachycardia present.     Pulses: Normal pulses.     Heart sounds: Normal heart sounds. No murmur heard.    No gallop.  Pulmonary:     Effort: Pulmonary effort is normal. No respiratory distress.     Breath sounds: Normal breath sounds. No stridor. No wheezing or rhonchi.  Abdominal:     General: Abdomen is flat. Bowel sounds are normal.     Palpations: Abdomen is soft.  Musculoskeletal:     Cervical back: No rigidity.  Lymphadenopathy:     Cervical: No cervical adenopathy.  Skin:    Findings: No rash.  Neurological:     Mental Status: She is alert.           Assessment & Plan:  Generalized body aches - Plan:  Urinalysis, Routine w reflex microscopic, CBC with Differential/Platelet, COMPLETE METABOLIC PANEL WITH GFR, [place] spotted fvr abs pnl(IgG+IgM), B. burgdorfi antibodies by WB I believe the patient has a false negative COVID  test.  Begin Molunipavir 4 tablets twice daily for 5 days.  Use Tylenol 1000 mg every 6 hours as needed for headache.  Drink plenty of fluids.  Check CBC, urinalysis, CBC, CMP, Lyme titers, [place] spotted fever titers.  Seek medical attention immediately if she develops chest pain or shortness of breath

## 2021-12-21 NOTE — Telephone Encounter (Signed)
Spoke w/pt this morning, per pt she was able to pick up her meds and [facility]   Nothing further needed at this time.

## 2021-12-24 LAB — COMPLETE METABOLIC PANEL WITH GFR
AG Ratio: 1.2 (calc) (ref 1.0–2.5)
ALT: 17 U/L (ref 6–29)
AST: 20 U/L (ref 10–30)
Albumin: 3.9 g/dL (ref 3.6–5.1)
Alkaline phosphatase (APISO): 57 U/L (ref 31–125)
BUN: 7 mg/dL (ref 7–25)
CO2: 22 mmol/L (ref 20–32)
Calcium: 8.9 mg/dL (ref 8.6–10.2)
Chloride: 102 mmol/L (ref 98–110)
Creat: 0.76 mg/dL (ref 0.50–0.99)
Globulin: 3.3 g/dL (calc) (ref 1.9–3.7)
Glucose, Bld: 88 mg/dL (ref 65–99)
Potassium: 4.2 mmol/L (ref 3.5–5.3)
Sodium: 134 mmol/L — ABNORMAL LOW (ref 135–146)
Total Bilirubin: 0.5 mg/dL (ref 0.2–1.2)
Total Protein: 7.2 g/dL (ref 6.1–8.1)
eGFR: 100 mL/min/{1.73_m2} (ref >=60)

## 2021-12-24 LAB — B. BURGDORFI ANTIBODIES BY WB
B burgdorferi IgG Abs (IB): NEGATIVE
B burgdorferi IgM Abs (IB): NEGATIVE
Lyme Disease 18 kD IgG: NONREACTIVE
Lyme Disease 23 kD IgG: NONREACTIVE
Lyme Disease 23 kD IgM: NONREACTIVE
Lyme Disease 28 kD IgG: NONREACTIVE
Lyme Disease 30 kD IgG: NONREACTIVE
Lyme Disease 39 kD IgG: REACTIVE — AB
Lyme Disease 39 kD IgM: NONREACTIVE
Lyme Disease 41 kD IgG: REACTIVE — AB
Lyme Disease 41 kD IgM: NONREACTIVE
Lyme Disease 45 kD IgG: NONREACTIVE
Lyme Disease 58 kD IgG: NONREACTIVE
Lyme Disease 66 kD IgG: NONREACTIVE
Lyme Disease 93 kD IgG: NONREACTIVE

## 2021-12-24 LAB — ROCKY MTN SPOTTED FVR ABS PNL(IGG+IGM)
RMSF IgG: NOT DETECTED
RMSF IgM: NOT DETECTED

## 2021-12-24 LAB — CBC WITH DIFFERENTIAL/PLATELET
Absolute Monocytes: 840 cells/uL (ref 200–950)
Basophils Absolute: 22 cells/uL (ref 0–200)
Basophils Relative: 0.3 %
Eosinophils Absolute: 22 cells/uL (ref 15–500)
Eosinophils Relative: 0.3 %
HCT: 40.7 % (ref 35.0–45.0)
Hemoglobin: 13.8 g/dL (ref 11.7–15.5)
Lymphs Abs: 423 cells/uL — ABNORMAL LOW (ref 850–3900)
MCH: 27.7 pg (ref 27.0–33.0)
MCHC: 33.9 g/dL (ref 32.0–36.0)
MCV: 81.7 fL (ref 80.0–100.0)
MPV: 10.6 fL (ref 7.5–12.5)
Monocytes Relative: 11.5 %
Neutro Abs: 5993 cells/uL (ref 1500–7800)
Neutrophils Relative %: 82.1 %
Platelets: 242 10*3/uL (ref 140–400)
RBC: 4.98 10*6/uL (ref 3.80–5.10)
RDW: 14.6 % (ref 11.0–15.0)
Total Lymphocyte: 5.8 %
WBC: 7.3 10*3/uL (ref 3.8–10.8)

## 2022-01-22 ENCOUNTER — Other Ambulatory Visit: Payer: Self-pay | Admitting: Family Medicine

## 2022-01-23 MED ORDER — LORAZEPAM 0.5 MG PO TABS: 0.5000 mg | ORAL_TABLET | Freq: Three times a day (TID) | ORAL | 0 refills | Status: DC

## 2022-07-13 ENCOUNTER — Ambulatory Visit: Payer: BC Managed Care – PPO | Admitting: Family Medicine

## 2022-07-14 ENCOUNTER — Ambulatory Visit: Payer: BC Managed Care – PPO | Admitting: Family Medicine

## 2022-07-14 ENCOUNTER — Ambulatory Visit (HOSPITAL_COMMUNITY)
Admission: RE | Admit: 2022-07-14 | Discharge: 2022-07-14 | Disposition: A | Discharge status: Home / Self Care | Payer: BC Managed Care – PPO | Source: Ambulatory Visit | Attending: Family Medicine | Admitting: Family Medicine

## 2022-07-14 ENCOUNTER — Encounter: Payer: Self-pay | Admitting: Family Medicine

## 2022-07-14 VITALS — BP 122/82 | HR 96 | Temp 98.3°F | Ht 67.0 in | Wt 283.0 lb

## 2022-07-14 DIAGNOSIS — M5442 Lumbago with sciatica, left side: Secondary | ICD-10-CM

## 2022-07-14 MED ORDER — PREDNISONE 20 MG PO TABS: ORAL_TABLET | ORAL | 0 refills | Status: DC

## 2022-07-14 MED ORDER — CYCLOBENZAPRINE HCL 10 MG PO TABS: 10.0000 mg | ORAL_TABLET | Freq: Three times a day (TID) | ORAL | 0 refills | Status: DC | PRN

## 2022-07-14 NOTE — Progress Notes (Signed)
Subjective:    Patient ID: Sonya Olson, female    DOB: 1980/12/18, 42 y.o.   MRN: [id number]  Back Pain  Patient presents with pain in her left flank.  She states the pain starts underneath her ribs on the left side and radiates down her left leg.  She reports pain radiating into her left gluteus and down her left leg like sciatica.  She reports numbness and tingling radiating down her left leg similar to sciatica.  Pain has been present for less than a week.  She denies any bowel or bladder incontinence or saddle anesthesia.  She has some tenderness to palpation in the left paraspinal muscles adjacent to the thoracic and lumbar spine.  I believe these are compensatory muscle spasms.  There is no tenderness to palpation of the spinous process.  She denies any dysuria or hematuria or radiation of the pain into her pelvic area. Past Medical History:  Diagnosis Date   Allergy    Anxiety    [facility] 03/23/2020   Asthma    Depression    [facility] 03/23/2020   Eczema    GERD (gastroesophageal reflux disease)    [facility] 03/23/2020   Hydradenitis    on [facility]tx [facility]icade [facility]   Kidney stones 2008   last episode   PCOS (polycystic ovarian syndrome)    Sleep apnea    [facility] 03/23/2020   Past Surgical History:  Procedure Laterality Date   APPENDECTOMY     LITHOTRIPSY  2008   NECK SURGERY     Current Outpatient Medications on File Prior to Visit  Medication Sig Dispense Refill   acetaminophen (TYLENOL) 325 MG tablet Take 650 mg by mouth every 6 (six) hours as needed for mild pain or headache.     albuterol (VENTOLIN HFA) 108 (90 Base) MCG/ACT inhaler Inhale 1-2 puffs into the lungs every 6 (six) hours as needed for wheezing or shortness of breath. 1 each 3   cholecalciferol (VITAMIN D3) 25 MCG (1000 UNIT) tablet Take 2,000 Units by mouth daily.     FLUoxetine (PROZAC) 20 MG capsule Take 60 mg by mouth daily.     folic acid (FOLVITE) 1 MG tablet Take 3 mg by mouth daily.      HYDROcodone-acetaminophen (NORCO/VICODIN) 5-325 MG tablet Take 1 tablet by mouth every 6 (six) hours as needed for moderate pain. 14 tablet 0   inFLIXimab in sodium chloride 0.9 % Inject 5 mg/kg into the vein every 30 (thirty) days. remicade     levocetirizine (XYZAL) 5 MG tablet Take 1 tablet (5 mg total) by mouth every evening. 90 tablet 3   levonorgestrel-ethinyl estradiol (AVIANE,ALESSE,LESSINA) 0.1-20 MG-MCG tablet Take 1 tablet by mouth.     loratadine (CLARITIN) 10 MG tablet Take 10 mg by mouth daily.     LORazepam (ATIVAN) 0.5 MG tablet Take 1 tablet (0.5 mg total) by mouth every 8 (eight) hours. 30 tablet 0   methotrexate (RHEUMATREX) 2.5 MG tablet Take 6 tablets by mouth once a week. Takes on Saturdays     montelukast (SINGULAIR) 10 MG tablet Take 10 mg by mouth at bedtime.     pantoprazole (PROTONIX) 40 MG tablet Take 1 tablet (40 mg total) by mouth daily. 90 tablet 3   REMICADE 100 MG injection Inject into the vein.     tiZANidine (ZANAFLEX) 4 MG tablet Take 1 tablet (4 mg total) by mouth every 6 (six) hours as needed for muscle spasms. 30 tablet 0   traMADol (  ULTRAM) 50 MG tablet Take 50 mg by mouth every 6 (six) hours as needed.     Vitamin D, Ergocalciferol, (DRISDOL) 1.25 MG (50000 UNIT) CAPS capsule Take 1 capsule (50,000 Units total) by mouth every 7 (seven) days. 4 capsule 5   cephALEXin (KEFLEX) 500 MG capsule Take 1 capsule (500 mg total) by mouth 3 (three) times daily. (Patient not taking: Reported on 07/14/2022) 21 capsule 0   fluocinonide cream (LIDEX) [id number] % Apply 1 application topically daily.     ondansetron (ZOFRAN) 4 MG tablet Take 1 tablet (4 mg total) by mouth every 8 (eight) hours as needed for nausea or vomiting. (Patient not taking: Reported on 07/14/2022) 20 tablet 0   WEGOVY 2.4 MG/0.75ML SOAJ SMARTSIG:0.75 Milliliter(s) SUB-Q Once a Week (Patient not taking: Reported on 07/14/2022)     No current facility-administered medications on file prior to visit.    Allergies  Allergen Reactions   Bee Pollen    Other     Grass, trees, pet dander, dust     Strawberry Extract    Golimumab Itching   Social History   Socioeconomic History   Marital status: Married    Spouse name: Not on file   Number of children: Not on file   Years of education: Not on file   Highest education level: Not on file  Occupational History   Not on file  Tobacco Use   Smoking status: Never   Smokeless tobacco: Never  Vaping Use   Vaping Use: Never used  Substance and Sexual Activity   Alcohol use: No   Drug use: No   Sexual activity: Yes    Partners: Male    Birth control/protection: Pill  Other Topics Concern   Not on file  Social History Narrative   Entered 09/2017:   Married.   One child daughter age 97   Works at [employer] in [place] Strain: Not on [employer] Insecurity: Not on file  Transportation Needs: Not on file  Physical Activity: Not on file  Stress: Not on file  Social Connections: Not on file  Intimate Partner Violence: Not on file         Review of Systems  Musculoskeletal:  Positive for back pain.  All other systems reviewed and are negative.      Objective:   Physical Exam Vitals reviewed.  Constitutional:      General: She is not in acute distress.    Appearance: She is obese. She is not ill-appearing, toxic-appearing or diaphoretic.  Cardiovascular:     Rate and Rhythm: Regular rhythm. Tachycardia present.     Pulses: Normal pulses.     Heart sounds: Normal heart sounds. No murmur heard.    No gallop.  Pulmonary:     Effort: Pulmonary effort is normal. No respiratory distress.     Breath sounds: Normal breath sounds. No stridor. No wheezing or rhonchi.  Abdominal:     General: Abdomen is flat. Bowel sounds are normal.     Palpations: Abdomen is soft.  Musculoskeletal:     Cervical back: No rigidity.     Lumbar back: Spasms and  tenderness present. No swelling, deformity or bony tenderness. Decreased range of motion.       Back:       Legs:  Lymphadenopathy:     Cervical: No cervical adenopathy.  Neurological:     Mental Status: She is alert.  Assessment & Plan:  Acute left-sided low back pain with left-sided sciatica - Plan: DG Lumbar Spine Complete I believe the patient has suffered a herniated disc in her lower back.  Begin a prednisone taper pack for this.  Use Flexeril 10 mg every 8 hours as needed for muscle spasms.  Obtain an x-ray of the lumbar spine to evaluate further.  Recheck next week if no better or sooner if worsening

## 2022-08-10 IMAGING — CR DG CHEST 2V
2 series · 2 of 2 positions shown · non-contrast
Comparison: 03/03/2018

CLINICAL DATA: Left-sided chest pain

EXAM:
CHEST - 2 VIEW

[w chest lat]
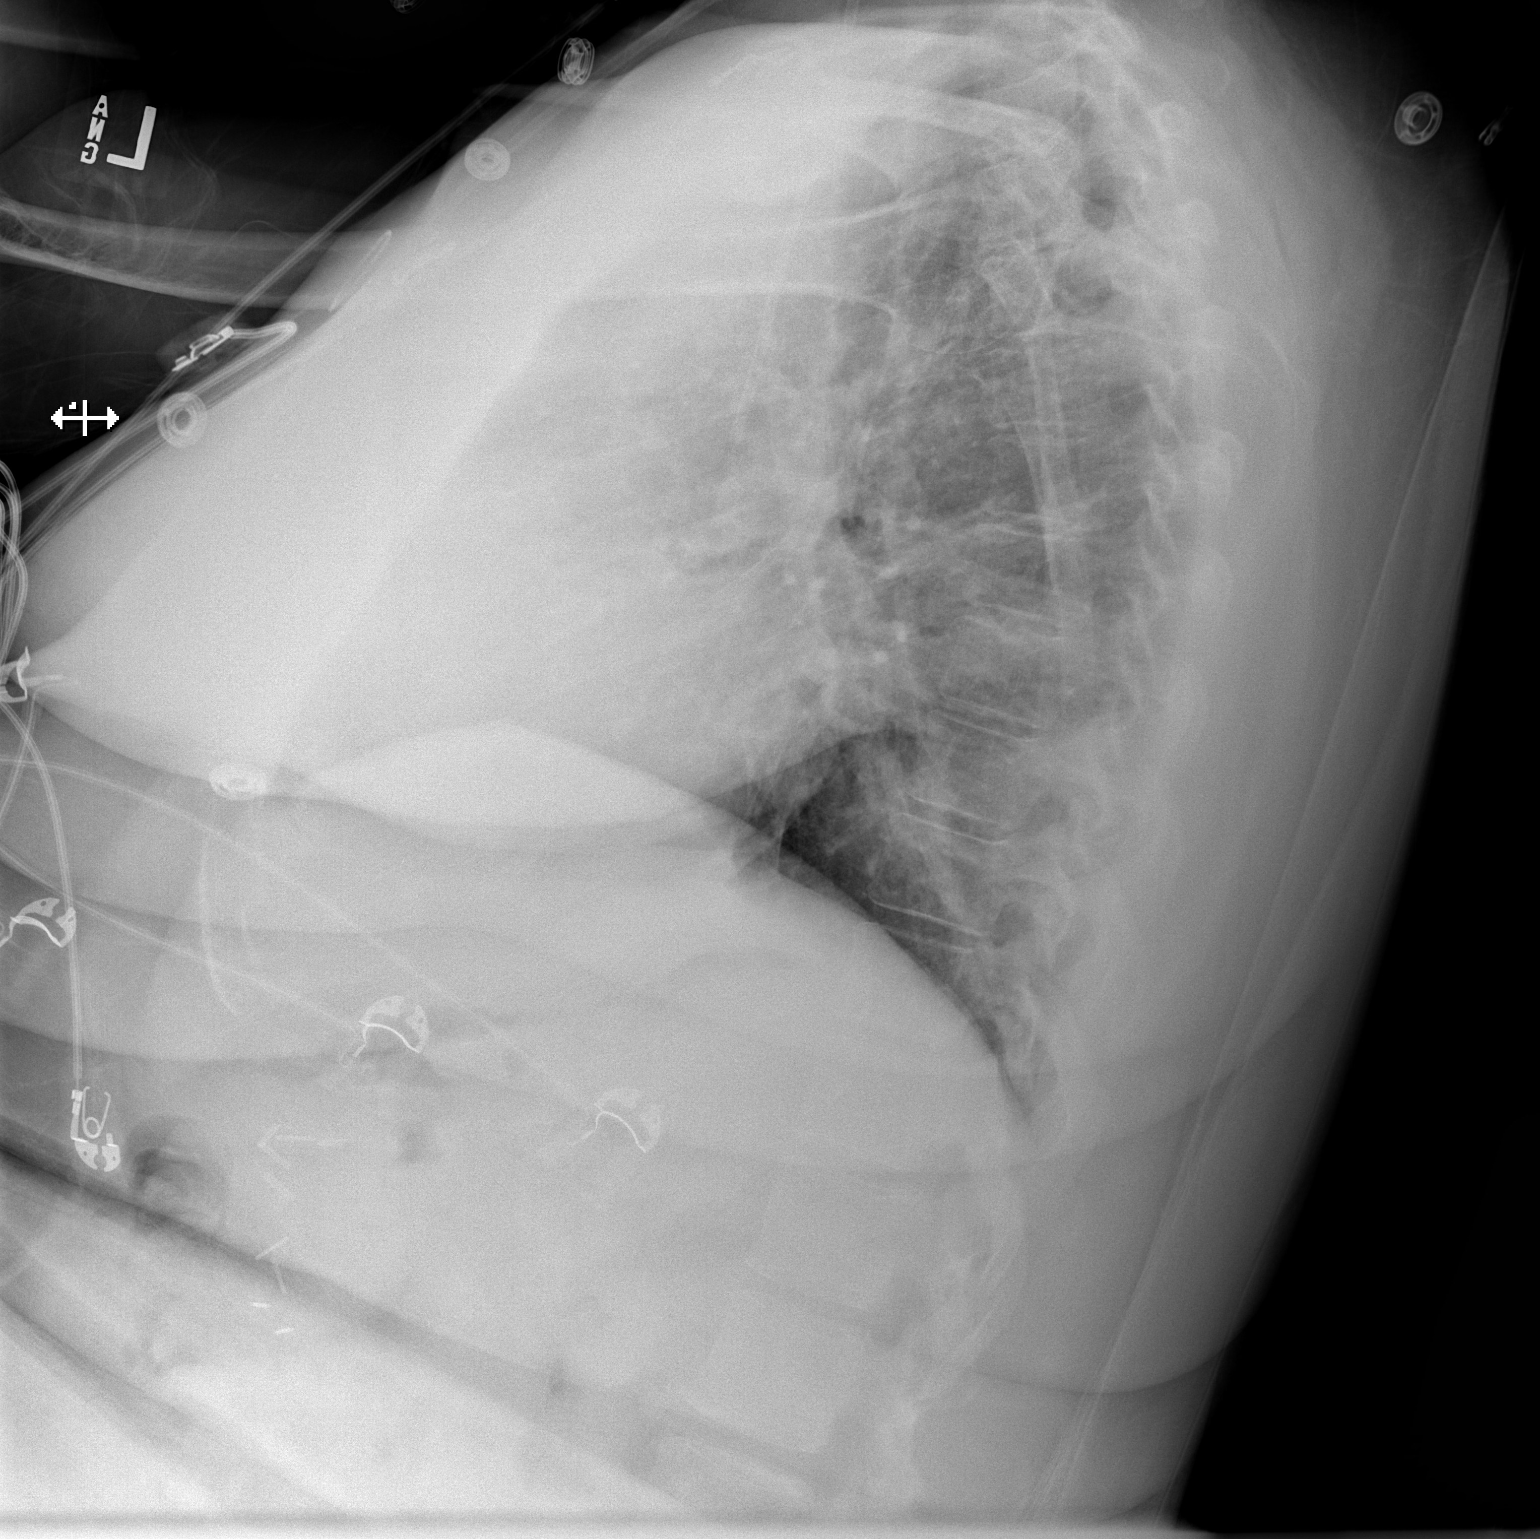

[x chest ap]
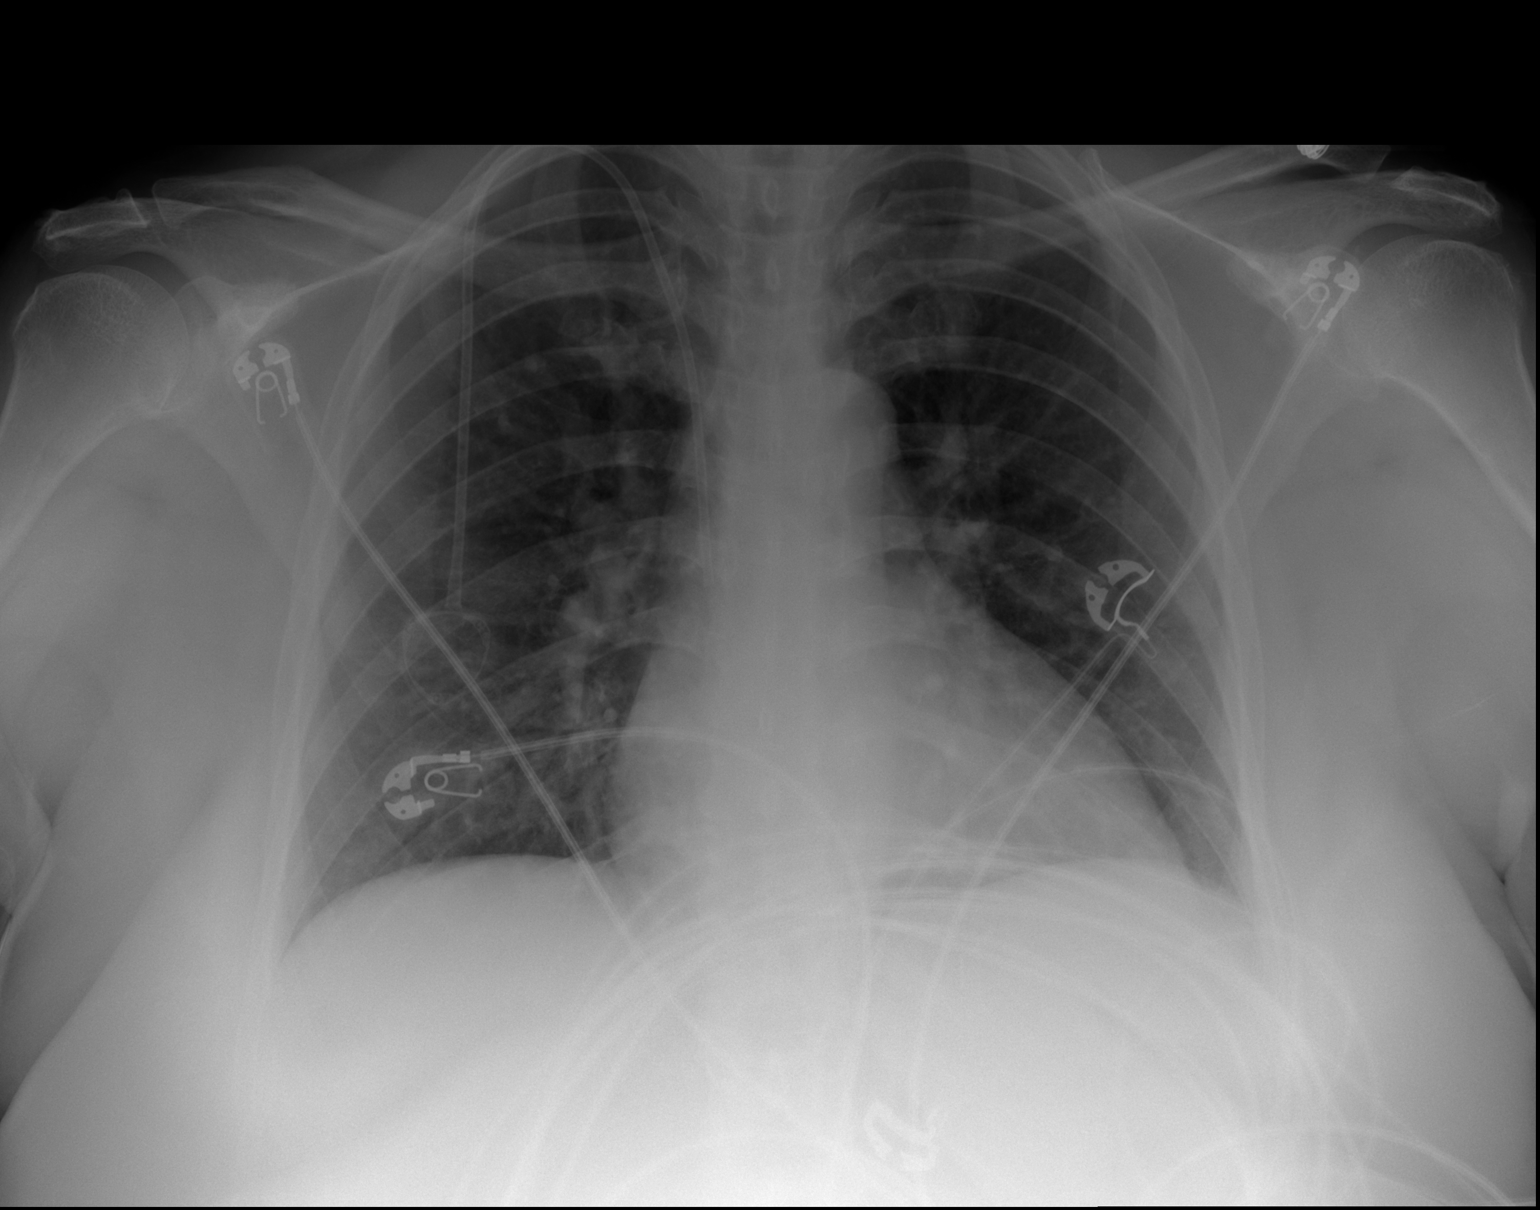

[2 of 2 positions shown; findings below may reference images not displayed]

FINDINGS: The heart size and mediastinal contours are within normal limits.
Right chest port catheter. Both lungs are clear. The visualized
skeletal structures are unremarkable.
IMPRESSION: No acute abnormality of the lungs.

## 2022-08-11 ENCOUNTER — Encounter: Payer: Self-pay | Admitting: Family Medicine

## 2022-08-11 ENCOUNTER — Ambulatory Visit (INDEPENDENT_AMBULATORY_CARE_PROVIDER_SITE_OTHER): Payer: BC Managed Care – PPO | Admitting: Family Medicine

## 2022-08-11 VITALS — BP 132/76 | HR 89 | Ht 67.0 in | Wt 286.4 lb

## 2022-08-11 DIAGNOSIS — Z0001 Encounter for general adult medical examination with abnormal findings: Secondary | ICD-10-CM

## 2022-08-11 DIAGNOSIS — L732 Hidradenitis suppurativa: Secondary | ICD-10-CM

## 2022-08-11 DIAGNOSIS — Z6841 Body Mass Index (BMI) 40.0 and over, adult: Secondary | ICD-10-CM

## 2022-08-11 DIAGNOSIS — E559 Vitamin D deficiency, unspecified: Secondary | ICD-10-CM | POA: Diagnosis not present

## 2022-08-11 DIAGNOSIS — Z0000 Encounter for general adult medical examination without abnormal findings: Secondary | ICD-10-CM

## 2022-08-11 DIAGNOSIS — R87610 Atypical squamous cells of undetermined significance on cytologic smear of cervix (ASC-US): Secondary | ICD-10-CM | POA: Insufficient documentation

## 2022-08-11 DIAGNOSIS — Z13220 Encounter for screening for lipoid disorders: Secondary | ICD-10-CM

## 2022-08-11 MED ORDER — VORTIOXETINE HBR 10 MG PO TABS: 10.0000 mg | ORAL_TABLET | Freq: Every day | ORAL | 11 refills | Status: DC

## 2022-08-11 MED ORDER — PANTOPRAZOLE SODIUM 40 MG PO TBEC: 40.0000 mg | DELAYED_RELEASE_TABLET | Freq: Every day | ORAL | 3 refills | Status: DC

## 2022-08-11 MED ORDER — MONTELUKAST SODIUM 10 MG PO TABS: 10.0000 mg | ORAL_TABLET | Freq: Every day | ORAL | 3 refills | Status: DC

## 2022-08-11 MED ORDER — LEVOCETIRIZINE DIHYDROCHLORIDE 5 MG PO TABS: 5.0000 mg | ORAL_TABLET | Freq: Every evening | ORAL | 3 refills | Status: DC

## 2022-08-11 NOTE — Progress Notes (Signed)
Subjective:    Patient ID: Sonya Olson, female    DOB: 1980-10-23, 42 y.o.   MRN: [id number]  HPI  Patient is a very pleasant 42 year old African-American female who is here today for CPE.  Past medical history is significant for gastric bypass. Past medical history significant for hidradenitis suppurativa for which she is on chronic immunosuppression with Remicade.  She also has a history of persistent vit D deficiency.   Wt Readings from Last 3 Encounters:  08/11/22 286 lb 6.4 oz (129.9 kg)  07/14/22 283 lb (128.4 kg)  12/20/21 257 lb 9.6 oz (116.8 kg)  Patient's insurance stopped covering her Wegovy last fall.  She has gained 30 pounds since she stopped it.  She is not exercising.  She recently saw her gynecologist.  They are performing Pap smear and her mammogram but she is not due for colonoscopy.  Otherwise she is doing well.  I reviewed her CBC, vitamin D level, CMP which were recently checked at her gynecologist and her specialist at [facility].  She is due to check a cholesterol panel  Past Medical History:  Diagnosis Date   Allergy    Anxiety    Phreesia 03/23/2020   Asthma    Depression    Phreesia 03/23/2020   Eczema    GERD (gastroesophageal reflux disease)    Phreesia 03/23/2020   Hydradenitis    on mtx and remicade [facility]   Kidney stones 2008   last episode   PCOS (polycystic ovarian syndrome)    Sleep apnea    Phreesia 03/23/2020   Past Surgical History:  Procedure Laterality Date   APPENDECTOMY     LITHOTRIPSY  2008   NECK SURGERY     Current Outpatient Medications on File Prior to Visit  Medication Sig Dispense Refill   acetaminophen (TYLENOL) 325 MG tablet Take 650 mg by mouth every 6 (six) hours as needed for mild pain or headache.     albuterol (VENTOLIN HFA) 108 (90 Base) MCG/ACT inhaler Inhale 1-2 puffs into the lungs every 6 (six) hours as needed for wheezing or shortness of breath. 1 each 3   cephALEXin (KEFLEX) 500 MG capsule Take 1 capsule  (500 mg total) by mouth 3 (three) times daily. (Patient not taking: Reported on 07/14/2022) 21 capsule 0   cholecalciferol (VITAMIN D3) 25 MCG (1000 UNIT) tablet Take 2,000 Units by mouth daily.     cyclobenzaprine (FLEXERIL) 10 MG tablet Take 1 tablet (10 mg total) by mouth 3 (three) times daily as needed for muscle spasms. 30 tablet 0   fluocinonide cream (LIDEX) 0.05 % Apply 1 application topically daily.     FLUoxetine (PROZAC) 20 MG capsule Take 60 mg by mouth daily.     folic acid (FOLVITE) 1 MG tablet Take 3 mg by mouth daily.     HYDROcodone-acetaminophen (NORCO/VICODIN) 5-325 MG tablet Take 1 tablet by mouth every 6 (six) hours as needed for moderate pain. 14 tablet 0   inFLIXimab in sodium chloride 0.9 % Inject 5 mg/kg into the vein every 30 (thirty) days. remicade     levocetirizine (XYZAL) 5 MG tablet Take 1 tablet (5 mg total) by mouth every evening. 90 tablet 3   levonorgestrel-ethinyl estradiol (AVIANE,ALESSE,LESSINA) 0.1-20 MG-MCG tablet Take 1 tablet by mouth.     loratadine (CLARITIN) 10 MG tablet Take 10 mg by mouth daily.     LORazepam (ATIVAN) 0.5 MG tablet Take 1 tablet (0.5 mg total) by mouth every 8 (eight) hours. 30  tablet 0   methotrexate (RHEUMATREX) 2.5 MG tablet Take 6 tablets by mouth once a week. Takes on Saturdays     montelukast (SINGULAIR) 10 MG tablet Take 10 mg by mouth at bedtime.     ondansetron (ZOFRAN) 4 MG tablet Take 1 tablet (4 mg total) by mouth every 8 (eight) hours as needed for nausea or vomiting. (Patient not taking: Reported on 07/14/2022) 20 tablet 0   pantoprazole (PROTONIX) 40 MG tablet Take 1 tablet (40 mg total) by mouth daily. 90 tablet 3   predniSONE (DELTASONE) 20 MG tablet 3 tabs poqday 1-2, 2 tabs poqday 3-4, 1 tab poqday 5-6 12 tablet 0   REMICADE 100 MG injection Inject into the vein.     tiZANidine (ZANAFLEX) 4 MG tablet Take 1 tablet (4 mg total) by mouth every 6 (six) hours as needed for muscle spasms. 30 tablet 0   traMADol (ULTRAM) 50  MG tablet Take 50 mg by mouth every 6 (six) hours as needed.     Vitamin D, Ergocalciferol, (DRISDOL) 1.25 MG (50000 UNIT) CAPS capsule Take 1 capsule (50,000 Units total) by mouth every 7 (seven) days. 4 capsule 5   WEGOVY 2.4 MG/0.75ML SOAJ SMARTSIG:0.75 Milliliter(s) SUB-Q Once a Week (Patient not taking: Reported on 07/14/2022)     No current facility-administered medications on file prior to visit.   Allergies  Allergen Reactions   Bee Pollen    Dog Epithelium Swelling   Other     Grass, trees, pet dander, dust     Strawberry Extract    Golimumab Itching   Social History   Socioeconomic History   Marital status: Married    Spouse name: Not on file   Number of children: Not on file   Years of education: Not on file   Highest education level: Not on file  Occupational History   Not on file  Tobacco Use   Smoking status: Never   Smokeless tobacco: Never  Vaping Use   Vaping Use: Never used  Substance and Sexual Activity   Alcohol use: No   Drug use: No   Sexual activity: Yes    Partners: Male    Birth control/protection: Pill  Other Topics Concern   Not on file  Social History Narrative   Entered 09/2017:   Married.   One child daughter age 23   Works at [employer] in [place]   Social Determinants of Corporate investment banker Strain: Not on [employer] Insecurity: Not on file  Transportation Needs: Not on file  Physical Activity: Not on file  Stress: Not on file  Social Connections: Not on file  Intimate Partner Violence: Not on file     Review of Systems  All other systems reviewed and are negative.      Objective:   Physical Exam Vitals reviewed.  Constitutional:      General: She is not in acute distress.    Appearance: She is obese. She is not ill-appearing, toxic-appearing or diaphoretic.  Cardiovascular:     Rate and Rhythm: Normal rate and regular rhythm.     Pulses: Normal pulses.     Heart sounds: Normal heart  sounds. No murmur heard.    No gallop.  Pulmonary:     Effort: Pulmonary effort is normal. No respiratory distress.     Breath sounds: Normal breath sounds. No stridor. No wheezing or rhonchi.  Abdominal:     General: Abdomen is flat. Bowel sounds are normal.  There is no distension.     Palpations: Abdomen is soft.     Tenderness: There is no abdominal tenderness. There is no guarding or rebound.  Musculoskeletal:     Right lower leg: No edema.     Left lower leg: No edema.  Neurological:     Mental Status: She is alert.           Assessment & Plan:  General medical exam  Screening cholesterol level - Plan: Lipid panel  Vitamin D deficiency  Class 3 severe obesity with body mass index (BMI) of 40.0 to 44.9 in adult, unspecified obesity type, unspecified whether serious comorbidity present  Hidradenitis suppurativa Patient's mammogram and Pap smear are up-to-date.  Not yet due for colonoscopy.  I recommended the pneumonia vaccine due to her immunocompromise status but she declined that today.  I reviewed her CBC, TSH, CMP, and vitamin D level from other physicians.  These are all well within normal limits.  I recommended that she return fasting for a lipid panel.  Recommended she try a compounding pharmacy regarding semaglutide.  If she wants to go that route I will be glad to prescribe the medication for her because she was truly benefiting from it.  We also switch Prozac to Trintellix 10 mg a day because its weight neutral and she has gained weight since starting the Prozac

## 2022-08-15 ENCOUNTER — Encounter: Payer: Self-pay | Admitting: Family Medicine

## 2022-09-06 ENCOUNTER — Telehealth: Payer: Self-pay

## 2022-09-06 NOTE — Telephone Encounter (Signed)
Recived call from [facility] and they state pt is requesting a prescription of Trizepatide. It looks like the patient was seen by [facility]'s Bariatric clinic on 08/22/2022. Note below:  I discussed several key areas of the patient's treatment plan including diet and exercise counseling. Lengthy discussion today about antiobesity medication options. She did not tolerate Contrave in past. Phentermine 15 mg is ineffective but 30 mg causes severe insomnia. Reginal Lutes was ineffective and did not lose the required amount of weight. GLP-1's are now completely excluded from her insurance plan. Has a history of kidney stones so cannot utilize Topamax. Discussed she could utilize Vyvanse. She is considering compounded Mounjaro at a Insurance claims handler. I did emphasize that these medications are not FDA approved and therefore not recommended. Also discussed revision options which she is interested in. Will reach out to the surgical schedulers to get her in to see surgeon and nutrition class I. Explained she will restart the surgical process.  Addendum: Pt reached out after visit and wanted to start vyvanse. 40 mg capsule sent to pharmacy.  Electronically signed by Nathanial Rancher, PA-C at 08/23/2022 10:41 AM EDT

## 2022-09-20 ENCOUNTER — Other Ambulatory Visit: Payer: Self-pay | Admitting: Family Medicine

## 2022-09-21 NOTE — Telephone Encounter (Signed)
Requested medication (s) are due for refill today: Yes  Requested medication (s) are on the active medication list: Yes  Last refill:  08/22/21  Future visit scheduled: No  Notes to clinic:  See request.    Requested Prescriptions  Pending Prescriptions Disp Refills   Vitamin D, Ergocalciferol, (DRISDOL) 1.25 MG (50000 UNIT) CAPS capsule [Pharmacy Med Name: VITAMIN D2 1.25MG (50,000 UNIT)] 4 capsule 0    Sig: TAKE 1 CAPSULE BY MOUTH EVERY 7 DAYS.     Endocrinology:  Vitamins - Vitamin D Supplementation 2 Failed - 09/20/2022  5:00 PM      Failed - Manual Review: Route requests for 50,000 IU strength to the provider      Failed - Vitamin D in normal range and within 360 days    Vit D, 25-Hydroxy  Date Value Ref Range Status  08/09/2021 27 (L) 30 - 100 ng/mL Final    Comment:    Vitamin D Status         25-OH Vitamin D: . Deficiency:                    <20 ng/mL Insufficiency:             20 - 29 ng/mL Optimal:                 > or = 30 ng/mL . For 25-OH Vitamin D testing on patients on  D2-supplementation and patients for whom quantitation  of D2 and D3 fractions is required, the QuestAssureD(TM) 25-OH VIT D, (D2,D3), LC/MS/MS is recommended: order  code 16109 (patients >77yrs). See Note 1 . Note 1 . For additional information, please refer to  http://education.Quest[facility].com/faq/FAQ199  (This link is being provided for informational/ educational purposes only.)          Failed - Valid encounter within last 12 months    Recent Outpatient Visits           1 year ago Dysuria   [facility] Pickard, Priscille Heidelberg, MD   1 year ago Vitamin D deficiency   [facility] Tanya Nones, Priscille Heidelberg, MD   1 year ago Mild intermittent asthma with exacerbation   [facility] Cathlean Marseilles A, NP   2 years ago Acute left-sided low back pain with left-sided sciatica   [facility] Valentino Nose, NP   2 years ago  Vitamin D deficiency   [facility] Pickard, Priscille Heidelberg, MD              Passed - Ca in normal range and within 360 days    Calcium  Date Value Ref Range Status  12/20/2021 8.9 8.6 - 10.2 mg/dL Final

## 2022-09-26 ENCOUNTER — Other Ambulatory Visit: Payer: BC Managed Care – PPO

## 2022-09-26 DIAGNOSIS — D508 Other iron deficiency anemias: Secondary | ICD-10-CM

## 2022-09-26 DIAGNOSIS — E559 Vitamin D deficiency, unspecified: Secondary | ICD-10-CM

## 2022-09-26 DIAGNOSIS — R5383 Other fatigue: Secondary | ICD-10-CM

## 2022-09-26 DIAGNOSIS — E282 Polycystic ovarian syndrome: Secondary | ICD-10-CM

## 2022-09-26 DIAGNOSIS — Z13220 Encounter for screening for lipoid disorders: Secondary | ICD-10-CM

## 2022-09-26 LAB — CBC WITH DIFFERENTIAL/PLATELET
Absolute Monocytes: 592 cells/uL (ref 200–950)
Eosinophils Relative: 1.3 %
MCHC: 33.5 g/dL (ref 32.0–36.0)
Neutro Abs: 4655 cells/uL (ref 1500–7800)
Platelets: 277 10*3/uL (ref 140–400)
Total Lymphocyte: 37.9 %
WBC: 8.7 10*3/uL (ref 3.8–10.8)

## 2022-09-27 LAB — COMPLETE METABOLIC PANEL WITH GFR
AG Ratio: 1.2 (calc) (ref 1.0–2.5)
ALT: 17 U/L (ref 6–29)
AST: 16 U/L (ref 10–30)
Albumin: 4 g/dL (ref 3.6–5.1)
Alkaline phosphatase (APISO): 58 U/L (ref 31–125)
BUN: 13 mg/dL (ref 7–25)
CO2: 23 mmol/L (ref 20–32)
Calcium: 9.1 mg/dL (ref 8.6–10.2)
Chloride: 105 mmol/L (ref 98–110)
Creat: 0.77 mg/dL (ref 0.50–0.99)
Globulin: 3.3 g/dL (calc) (ref 1.9–3.7)
Glucose, Bld: 100 mg/dL — ABNORMAL HIGH (ref 65–99)
Potassium: 4.5 mmol/L (ref 3.5–5.3)
Sodium: 140 mmol/L (ref 135–146)
Total Bilirubin: 0.4 mg/dL (ref 0.2–1.2)
Total Protein: 7.3 g/dL (ref 6.1–8.1)
eGFR: 98 mL/min/{1.73_m2} (ref >=60)

## 2022-09-27 LAB — LIPID PANEL
Cholesterol: 188 mg/dL (ref <200)
HDL: 66 mg/dL (ref >=50)
LDL Cholesterol (Calc): 104 mg/dL (calc) — ABNORMAL HIGH
Non-HDL Cholesterol (Calc): 122 mg/dL (calc) (ref <130)
Total CHOL/HDL Ratio: 2.8 (calc) (ref <5.0)
Triglycerides: 86 mg/dL (ref <150)

## 2022-09-27 LAB — HEMOGLOBIN A1C
Hgb A1c MFr Bld: 5.9 % of total Hgb — ABNORMAL HIGH (ref <5.7)
Mean Plasma Glucose: 123 mg/dL
eAG (mmol/L): 6.8 mmol/L

## 2022-09-27 LAB — CBC WITH DIFFERENTIAL/PLATELET
Basophils Absolute: 44 cells/uL (ref 0–200)
Basophils Relative: 0.5 %
Eosinophils Absolute: 113 cells/uL (ref 15–500)
HCT: 43.6 % (ref 35.0–45.0)
Hemoglobin: 14.6 g/dL (ref 11.7–15.5)
Lymphs Abs: 3297 cells/uL (ref 850–3900)
MCH: 26.7 pg — ABNORMAL LOW (ref 27.0–33.0)
MCV: 79.9 fL — ABNORMAL LOW (ref 80.0–100.0)
MPV: 11 fL (ref 7.5–12.5)
Monocytes Relative: 6.8 %
Neutrophils Relative %: 53.5 %
RBC: 5.46 10*6/uL — ABNORMAL HIGH (ref 3.80–5.10)
RDW: 14.5 % (ref 11.0–15.0)

## 2022-09-27 LAB — VITAMIN D 25 HYDROXY (VIT D DEFICIENCY, FRACTURES): Vit D, 25-Hydroxy: 46 ng/mL (ref 30–100)

## 2022-09-27 LAB — TSH: TSH: 1.19 mIU/L

## 2022-09-29 ENCOUNTER — Encounter: Payer: Self-pay | Admitting: Family Medicine

## 2022-09-29 ENCOUNTER — Ambulatory Visit (HOSPITAL_COMMUNITY)
Admission: RE | Admit: 2022-09-29 | Discharge: 2022-09-29 | Disposition: A | Discharge status: Home / Self Care | Payer: BC Managed Care – PPO | Source: Ambulatory Visit | Attending: Family Medicine | Admitting: Family Medicine

## 2022-09-29 ENCOUNTER — Ambulatory Visit: Payer: BC Managed Care – PPO | Admitting: Family Medicine

## 2022-09-29 VITALS — BP 124/82 | HR 84 | Temp 98.4°F | Ht 67.0 in | Wt 288.8 lb

## 2022-09-29 DIAGNOSIS — M79671 Pain in right foot: Secondary | ICD-10-CM | POA: Insufficient documentation

## 2022-09-29 NOTE — Progress Notes (Signed)
Subjective:    Patient ID: Sonya Olson, female    DOB: 10/11/1980, 42 y.o.   MRN: [id number]  HPI 1 month ago, the patient twisted her distal right forefoot and sprained her foot.  She has tenderness and swelling over the distal fifth metatarsal and proximal fifth toe.  It hurts to walk.  There is swelling by the end of the day.  She is very tender to palpation in that area.  She is concerned because it has been more than 4 weeks and the pain is not improving  Past Medical History:  Diagnosis Date   Allergy    Anxiety    Phreesia 03/23/2020   Asthma    Depression    Phreesia 03/23/2020   Eczema    GERD (gastroesophageal reflux disease)    Phreesia 03/23/2020   Hydradenitis    on [facility]tx and [facility]icade [facility]   Kidney stones 2008   last episode   PCOS (polycystic ovarian syndrome)    Sleep apnea    Phreesia 03/23/2020   Past Surgical History:  Procedure Laterality Date   APPENDECTOMY     LITHOTRIPSY  2008   NECK SURGERY     Current Outpatient Medications on File Prior to Visit  Medication Sig Dispense Refill   acetaminophen (TYLENOL) 325 MG tablet Take 650 mg by mouth every 6 (six) hours as needed for mild pain or headache.     albuterol (VENTOLIN HFA) 108 (90 Base) MCG/ACT inhaler Inhale 1-2 puffs into the lungs every 6 (six) hours as needed for wheezing or shortness of breath. 1 each 3   cholecalciferol (VITAMIN D3) 25 MCG (1000 UNIT) tablet Take 2,000 Units by mouth daily.     cyclobenzaprine (FLEXERIL) 10 MG tablet Take 1 tablet (10 mg total) by mouth 3 (three) times daily as needed for muscle spasms. 30 tablet 0   fluocinonide cream (LIDEX) 0.05 % Apply 1 application topically daily.     folic acid (FOLVITE) 1 MG tablet Take 3 mg by mouth daily.     inFLIXimab in sodium chloride 0.9 % Inject 5 mg/kg into the vein every 30 (thirty) days. remicade     levocetirizine (XYZAL) 5 MG tablet Take 1 tablet (5 mg total) by mouth every evening. 90 tablet 3    levonorgestrel-ethinyl estradiol (AVIANE,ALESSE,LESSINA) 0.1-20 MG-MCG tablet Take 1 tablet by mouth.     loratadine (CLARITIN) 10 MG tablet Take 10 mg by mouth daily.     LORazepam (ATIVAN) 0.5 MG tablet Take 1 tablet (0.5 mg total) by mouth every 8 (eight) hours. 30 tablet 0   methotrexate (RHEUMATREX) 2.5 MG tablet Take 6 tablets by mouth once a week. Takes on Saturdays     montelukast (SINGULAIR) 10 MG tablet Take 1 tablet (10 mg total) by mouth at bedtime. 90 tablet 3   pantoprazole (PROTONIX) 40 MG tablet Take 1 tablet (40 mg total) by mouth daily. 90 tablet 3   REMICADE 100 MG injection Inject into the vein.     tiZANidine (ZANAFLEX) 4 MG tablet Take 1 tablet (4 mg total) by mouth every 6 (six) hours as needed for muscle spasms. 30 tablet 0   traMADol (ULTRAM) 50 MG tablet Take 50 mg by mouth every 6 (six) hours as needed.     Vitamin D, Ergocalciferol, (DRISDOL) 1.25 MG (50000 UNIT) CAPS capsule TAKE 1 CAPSULE BY MOUTH EVERY 7 DAYS. 4 capsule 0   vortioxetine HBr (TRINTELLIX) 10 MG TABS tablet Take 1 tablet (10 mg total)  by mouth daily. Stop prozac 30 tablet 11   WEGOVY 2.4 MG/0.75ML SOAJ SMARTSIG:0.75 Milliliter(s) SUB-Q Once a Week (Patient not taking: Reported on 07/14/2022)     No current facility-administered medications on file prior to visit.   Allergies  Allergen Reactions   Bee Pollen    Dog Epithelium Swelling   Other     Grass, trees, pet dander, dust     Strawberry Extract    Golimumab Itching   Social History   Socioeconomic History   Marital status: Married    Spouse name: Not on file   Number of children: Not on file   Years of education: Not on file   Highest education level: Bachelor's degree (e.g., BA, AB, BS)  Occupational History   Not on file  Tobacco Use   Smoking status: Never   Smokeless tobacco: Never  Vaping Use   Vaping Use: Never used  Substance and Sexual Activity   Alcohol use: No   Drug use: No   Sexual activity: Yes    Partners: Male     Birth control/protection: Pill  Other Topics Concern   Not on file  Social History Narrative   Entered 09/2017:   Married.   One child daughter age 90   Works at [employer] in [employer]   Social Determinants of Health   Financial Resource Strain: Low Risk  (09/26/2022)   Overall Financial Resource Strain (CARDIA)    Difficulty of Paying Living Expenses: Not hard at all  Food Insecurity: No Food Insecurity (09/26/2022)   Hunger Vital Sign    Worried About Running Out of Food in the Last Year: Never true    Ran Out of Food in the Last Year: Never true  Transportation Needs: No Transportation Needs (09/26/2022)   PRAPARE - Administrator, Civil Service (Medical): No    Lack of Transportation (Non-Medical): No  Physical Activity: Unknown (09/26/2022)   Exercise Vital Sign    Days of Exercise per Week: 0 days    Minutes of Exercise per Session: Not on file  Stress: No Stress Concern Present (09/26/2022)   [employer] of Occupational Health - Occupational Stress Questionnaire    Feeling of Stress : Not at all  Social Connections: Socially Integrated (09/26/2022)   Social Connection and Isolation Panel [NHANES]    Frequency of Communication with Friends and Family: More than three times a week    Frequency of Social Gatherings with Friends and Family: More than three times a week    Attends Religious Services: More than 4 times per year    Active Member of [employer] or Organizations: Yes    Attends Engineer, structural: More than 4 times per year    Marital Status: Married  Catering manager Violence: Not on file         Review of Systems  All other systems reviewed and are negative.      Objective:   Physical Exam Vitals reviewed.  Constitutional:      General: She is not in acute distress.    Appearance: She is obese. She is not ill-appearing, toxic-appearing or diaphoretic.  Cardiovascular:     Rate and Rhythm: Normal rate and  regular rhythm.     Pulses: Normal pulses.          Dorsalis pedis pulses are 2+ on the right side.       Posterior tibial pulses are 2+ on the right side.  Heart sounds: Normal heart sounds. No murmur heard.    No gallop.  Pulmonary:     Effort: Pulmonary effort is normal. No respiratory distress.     Breath sounds: Normal breath sounds. No stridor. No wheezing or rhonchi.  Abdominal:     Palpations: Abdomen is soft.  Musculoskeletal:     Right foot: Decreased range of motion.       Feet:  Feet:     Right foot:     Skin integrity: No blister, skin breakdown, erythema or warmth.  Skin:    Findings: No rash.  Neurological:     Mental Status: She is alert.           Assessment & Plan:  Right foot pain - Plan: DG Foot Complete Right Patient has never sprained fifth MP joint or possibly a disc fragment for metatarsal.  Obtain an x-ray to evaluate further.  Patient will likely need a cam walker for 2 to 4 weeks if a fracture is present

## 2022-10-03 ENCOUNTER — Other Ambulatory Visit: Payer: Self-pay

## 2022-10-23 ENCOUNTER — Other Ambulatory Visit: Payer: Self-pay | Admitting: Family Medicine

## 2022-10-25 MED ORDER — LORAZEPAM 0.5 MG PO TABS: 0.5000 mg | ORAL_TABLET | Freq: Three times a day (TID) | ORAL | 0 refills | Status: DC

## 2022-10-31 ENCOUNTER — Ambulatory Visit (HOSPITAL_COMMUNITY)
Admission: RE | Admit: 2022-10-31 | Discharge: 2022-10-31 | Disposition: A | Discharge status: Home / Self Care | Payer: BC Managed Care – PPO | Source: Ambulatory Visit | Attending: Family Medicine | Admitting: Family Medicine

## 2022-10-31 ENCOUNTER — Ambulatory Visit: Payer: BC Managed Care – PPO | Admitting: Family Medicine

## 2022-10-31 VITALS — BP 136/78 | HR 110 | Temp 98.2°F | Wt 291.0 lb

## 2022-10-31 DIAGNOSIS — S92352A Displaced fracture of fifth metatarsal bone, left foot, initial encounter for closed fracture: Secondary | ICD-10-CM | POA: Diagnosis present

## 2022-10-31 DIAGNOSIS — S92352D Displaced fracture of fifth metatarsal bone, left foot, subsequent encounter for fracture with routine healing: Secondary | ICD-10-CM

## 2022-10-31 NOTE — Progress Notes (Signed)
Subjective:    Patient ID: Sonya Olson, female    DOB: [birth year], 42 y.o.   MRN: [id number]  HPI 09/29/22 1 month ago, the patient twisted her distal right forefoot and sprained her foot.  She has tenderness and swelling over the distal fifth metatarsal and proximal fifth toe.  It hurts to walk.  There is swelling by the end of the day.  She is very tender to palpation in that area.  She is concerned because it has been more than 4 weeks and the pain is not improving.  At that time, my plan was:  Patient has never sprained fifth MP joint or possibly a disc fragment for metatarsal.  Obtain an x-ray to evaluate further.  Patient will likely need a cam walker for 2 to 4 weeks if a fracture is present  10/31/22 X-ray revealed a subacute mildly displaced oblique fracture of the distal 50% of the fifth metatarsal shaft.  Patient has been wearing a cam walker for the last 4 weeks.  She states the pain over the dorsal lateral aspect of the foot has improved.  Today there is no swelling.  There is no bruising.  There is no tenderness to palpation.  She states she occasionally feels pain however is much better than her last visit.  Past Medical History:  Diagnosis Date   Allergy    Anxiety    Phreesia 03/23/2020   Asthma    Depression    Phreesia 03/23/2020   Eczema    GERD (gastroesophageal reflux disease)    Phreesia 03/23/2020   Hydradenitis    on mtx and remicade [facility]   Kidney stones 2008   last episode   PCOS (polycystic ovarian syndrome)    Sleep apnea    Phreesia 03/23/2020   Past Surgical History:  Procedure Laterality Date   APPENDECTOMY     LITHOTRIPSY  2008   NECK SURGERY     Current Outpatient Medications on File Prior to Visit  Medication Sig Dispense Refill   acetaminophen (TYLENOL) 325 MG tablet Take 650 mg by mouth every 6 (six) hours as needed for mild pain or headache.     albuterol (VENTOLIN HFA) 108 (90 Base) MCG/ACT inhaler Inhale 1-2 puffs into  the lungs every 6 (six) hours as needed for wheezing or shortness of breath. 1 each 3   cholecalciferol (VITAMIN D3) 25 MCG (1000 UNIT) tablet Take 2,000 Units by mouth daily.     cyclobenzaprine (FLEXERIL) 10 MG tablet Take 1 tablet (10 mg total) by mouth 3 (three) times daily as needed for muscle spasms. 30 tablet 0   fluocinonide cream (LIDEX) 0.05 % Apply 1 application topically daily.     folic acid (FOLVITE) 1 MG tablet Take 3 mg by mouth daily.     inFLIXimab in sodium chloride 0.9 % Inject 5 mg/kg into the vein every 30 (thirty) days. remicade     levocetirizine (XYZAL) 5 MG tablet Take 1 tablet (5 mg total) by mouth every evening. 90 tablet 3   levonorgestrel-ethinyl estradiol (AVIANE,ALESSE,LESSINA) 0.1-20 MG-MCG tablet Take 1 tablet by mouth.     loratadine (CLARITIN) 10 MG tablet Take 10 mg by mouth daily.     LORazepam (ATIVAN) 0.5 MG tablet Take 1 tablet (0.5 mg total) by mouth every 8 (eight) hours. 30 tablet 0   methotrexate (RHEUMATREX) 2.5 MG tablet Take 6 tablets by mouth once a week. Takes on Saturdays     montelukast (SINGULAIR) 10 MG tablet Take  1 tablet (10 mg total) by mouth at bedtime. 90 tablet 3   pantoprazole (PROTONIX) 40 MG tablet Take 1 tablet (40 mg total) by mouth daily. 90 tablet 3   REMICADE 100 MG injection Inject into the vein.     tiZANidine (ZANAFLEX) 4 MG tablet Take 1 tablet (4 mg total) by mouth every 6 (six) hours as needed for muscle spasms. 30 tablet 0   traMADol (ULTRAM) 50 MG tablet Take 50 mg by mouth every 6 (six) hours as needed.     Vitamin D, Ergocalciferol, (DRISDOL) 1.25 MG (50000 UNIT) CAPS capsule TAKE 1 CAPSULE BY MOUTH EVERY 7 DAYS. 4 capsule 0   vortioxetine HBr (TRINTELLIX) 10 MG TABS tablet Take 1 tablet (10 mg total) by mouth daily. Stop prozac 30 tablet 11   VYVANSE 40 MG capsule Take 40 mg by mouth every morning.     WEGOVY 2.4 MG/0.75ML SOAJ      No current facility-administered medications on file prior to visit.   Allergies   Allergen Reactions   Bee Pollen    Dog Epithelium Swelling   Other     Grass, trees, pet dander, dust     Strawberry Extract    Golimumab Itching   Social History   Socioeconomic History   Marital status: Married    Spouse name: Not on file   Number of children: Not on file   Years of education: Not on file   Highest education level: Bachelor's degree (e.g., BA, AB, BS)  Occupational History   Not on file  Tobacco Use   Smoking status: Never   Smokeless tobacco: Never  Vaping Use   Vaping Use: Never used  Substance and Sexual Activity   Alcohol use: No   Drug use: No   Sexual activity: Yes    Partners: Male    Birth control/protection: Pill  Other Topics Concern   Not on file  Social History Narrative   Entered 09/2017:   Married.   One child daughter age 47   Works at [employer] in [employer]   Social Determinants of Health   Financial Resource Strain: Low Risk  (09/26/2022)   Overall Financial Resource Strain (CARDIA)    Difficulty of Paying Living Expenses: Not hard at all  Food Insecurity: No Food Insecurity (09/26/2022)   Hunger Vital Sign    Worried About Running Out of Food in the Last Year: Never true    Ran Out of Food in the Last Year: Never true  Transportation Needs: No Transportation Needs (09/26/2022)   PRAPARE - Administrator, Civil Service (Medical): No    Lack of Transportation (Non-Medical): No  Physical Activity: Unknown (09/26/2022)   Exercise Vital Sign    Days of Exercise per Week: 0 days    Minutes of Exercise per Session: Not on file  Stress: No Stress Concern Present (09/26/2022)   [employer] of Occupational Health - Occupational Stress Questionnaire    Feeling of Stress : Not at all  Social Connections: Socially Integrated (09/26/2022)   Social Connection and Isolation Panel [NHANES]    Frequency of Communication with Friends and Family: More than three times a week    Frequency of Social Gatherings  with Friends and Family: More than three times a week    Attends Religious Services: More than 4 times per year    Active Member of [employer] or Organizations: Yes    Attends Banker Meetings: More than 4 times  per year    Marital Status: Married  Catering manager Violence: Not on file         Review of Systems  All other systems reviewed and are negative.      Objective:   Physical Exam Vitals reviewed.  Constitutional:      General: She is not in acute distress.    Appearance: She is obese. She is not ill-appearing, toxic-appearing or diaphoretic.  Cardiovascular:     Rate and Rhythm: Normal rate and regular rhythm.     Pulses: Normal pulses.          Dorsalis pedis pulses are 2+ on the right side.       Posterior tibial pulses are 2+ on the right side.     Heart sounds: Normal heart sounds. No murmur heard.    No gallop.  Pulmonary:     Effort: Pulmonary effort is normal. No respiratory distress.     Breath sounds: Normal breath sounds. No stridor. No wheezing or rhonchi.  Abdominal:     Palpations: Abdomen is soft.  Musculoskeletal:     Right foot: Decreased range of motion.       Feet:  Feet:     Right foot:     Skin integrity: No blister, skin breakdown, erythema or warmth.  Skin:    Findings: No rash.  Neurological:     Mental Status: She is alert.           Assessment & Plan:  Displaced fracture of fifth metatarsal bone, left foot, initial encounter for closed fracture - Plan: DG Foot Complete Right My only concern at this point is if the fracture is healing properly.  Proceed with an x-ray of the foot.  If there is evidence of fracture callus formation, we may continue the cam walker for 2-4 more weeks with weightbearing depending upon the extent of healing.  If there is no callus formation, I will put in a consult orthopedics to discuss possible operative intervention

## 2022-11-06 ENCOUNTER — Other Ambulatory Visit: Payer: Self-pay | Admitting: Family Medicine

## 2022-11-07 ENCOUNTER — Encounter: Payer: Self-pay | Admitting: Family Medicine

## 2022-11-07 MED ORDER — VITAMIN D (ERGOCALCIFEROL) 1.25 MG (50000 UNIT) PO CAPS: 50000.0000 [IU] | ORAL_CAPSULE | ORAL | 0 refills | Status: DC

## 2022-12-07 ENCOUNTER — Other Ambulatory Visit: Payer: Self-pay | Admitting: Family Medicine

## 2022-12-07 ENCOUNTER — Ambulatory Visit (HOSPITAL_COMMUNITY)
Admission: RE | Admit: 2022-12-07 | Discharge: 2022-12-07 | Disposition: A | Discharge status: Home / Self Care | Payer: BC Managed Care – PPO | Source: Ambulatory Visit | Attending: Family Medicine | Admitting: Family Medicine

## 2022-12-07 DIAGNOSIS — M79671 Pain in right foot: Secondary | ICD-10-CM

## 2022-12-24 ENCOUNTER — Other Ambulatory Visit: Payer: Self-pay | Admitting: Family Medicine

## 2022-12-26 MED ORDER — VITAMIN D (ERGOCALCIFEROL) 1.25 MG (50000 UNIT) PO CAPS: 50000.0000 [IU] | ORAL_CAPSULE | ORAL | 0 refills | Status: DC

## 2022-12-26 MED ORDER — LORAZEPAM 0.5 MG PO TABS: 0.5000 mg | ORAL_TABLET | Freq: Three times a day (TID) | ORAL | 0 refills | Status: DC

## 2023-02-06 ENCOUNTER — Other Ambulatory Visit: Payer: Self-pay | Admitting: Family Medicine

## 2023-02-06 NOTE — Telephone Encounter (Signed)
Requested medication (s) are due for refill today: yes  Requested medication (s) are on the active medication list: yes  Last refill:  12/26/22 #4/0  Future visit scheduled: no  Notes to clinic:  Unable to refill per protocol, cannot delegate.    Requested Prescriptions  Pending Prescriptions Disp Refills   Vitamin D, Ergocalciferol, (DRISDOL) 1.25 MG (50000 UNIT) CAPS capsule [Pharmacy Med Name: ergocalciferol (vitamin D2) 1,250 mcg (50,000 unit) capsule] 4 capsule 0    Sig: TAKE ONE CAPSULE BY MOUTH EVERY 7 DAYS     Endocrinology:  Vitamins - Vitamin D Supplementation 2 Failed - 02/06/2023  2:36 PM      Failed - Manual Review: Route requests for 50,000 IU strength to the provider      Failed - Valid encounter within last 12 months    Recent Outpatient Visits           1 year ago Dysuria   [facility] Pickard, Priscille Heidelberg, MD   1 year ago Vitamin D deficiency   [facility] Donita Brooks, MD   1 year ago Mild intermittent asthma with exacerbation   [facility] Valentino Nose, NP   2 years ago Acute left-sided low back pain with left-sided sciatica   [facility] Valentino Nose, NP   2 years ago Vitamin D deficiency   [facility] Pickard, Priscille Heidelberg, MD              Passed - Ca in normal range and within 360 days    Calcium  Date Value Ref Range Status  09/26/2022 9.1 8.6 - 10.2 mg/dL Final         Passed - Vitamin D in normal range and within 360 days    Vit D, 25-Hydroxy  Date Value Ref Range Status  09/26/2022 46 30 - 100 ng/mL Final    Comment:    Vitamin D Status         25-OH Vitamin D: . Deficiency:                    <20 ng/mL Insufficiency:             20 - 29 ng/mL Optimal:                 > or = 30 ng/mL . For 25-OH Vitamin D testing on patients on  D2-supplementation and patients for whom quantitation  of D2 and D3 fractions is required, the  QuestAssureD(TM) 25-OH VIT D, (D2,D3), LC/MS/MS is recommended: order  code 09811 (patients >61yrs). . See Note 1 . Note 1 . For additional information, please refer to  http://education.QuestDiagnostics.com/faq/FAQ199  (This link is being provided for informational/ educational purposes only.)

## 2023-03-12 ENCOUNTER — Other Ambulatory Visit: Payer: Self-pay

## 2023-03-12 NOTE — Telephone Encounter (Signed)
Prescription Request  03/12/2023  LOV: 10/31/22  What is the name of the medication or equipment? Vitamin D, Ergocalciferol, (DRISDOL) 1.25 MG (50000 UNIT) CAPS capsule [[id number]]  Have you contacted your pharmacy to request a refill? Yes   Which pharmacy would you like this sent to?  [facility] - [place], [place] - [address] [place] [address] Phone: [phone] Fax: [phone]    Patient notified that their request is being sent to the clinical staff for review and that they should receive a response within 2 business days.   Please advise at [facility] [phone]

## 2023-03-12 NOTE — Telephone Encounter (Signed)
Prescription Request  03/12/2023  LOV: 10/31/22  What is the name of the medication or equipment? LORazepam (ATIVAN) 0.5 MG tablet [[id number]]  Have you contacted your pharmacy to request a refill? Yes   Which pharmacy would you like this sent to?  [facility] - [place], [place] - [address] [place] [address] Phone: [phone] Fax: [phone]    Patient notified that their request is being sent to the clinical staff for review and that they should receive a response within 2 business days.   Please advise at [facility] [phone]

## 2023-03-13 NOTE — Telephone Encounter (Signed)
Requested medication (s) are due for refill today: yes  Requested medication (s) are on the active medication list: yes  Last refill:  12/26/22  Future visit scheduled: no  Notes to clinic:  Manual Review: Route requests for 50,000 IU strength to the provider.     Requested Prescriptions  Pending Prescriptions Disp Refills   Vitamin D, Ergocalciferol, (DRISDOL) 1.25 MG (50000 UNIT) CAPS capsule 4 capsule 0    Sig: Take 1 capsule (50,000 Units total) by mouth every 7 (seven) days.     Endocrinology:  Vitamins - Vitamin D Supplementation 2 Failed - 03/12/2023 11:17 AM      Failed - Manual Review: Route requests for 50,000 IU strength to the provider      Failed - Valid encounter within last 12 months    Recent Outpatient Visits           1 year ago Dysuria   [facility] Pickard, Priscille Heidelberg, MD   1 year ago Vitamin D deficiency   [facility] Donita Brooks, MD   1 year ago Mild intermittent asthma with exacerbation   [facility] Valentino Nose, NP   2 years ago Acute left-sided low back pain with left-sided sciatica   [facility] Valentino Nose, NP   2 years ago Vitamin D deficiency   [facility] Pickard, Priscille Heidelberg, MD              Passed - Ca in normal range and within 360 days    Calcium  Date Value Ref Range Status  09/26/2022 9.1 8.6 - 10.2 mg/dL Final         Passed - Vitamin D in normal range and within 360 days    Vit D, 25-Hydroxy  Date Value Ref Range Status  09/26/2022 46 30 - 100 ng/mL Final    Comment:    Vitamin D Status         25-OH Vitamin D: . Deficiency:                    <20 ng/mL Insufficiency:             20 - 29 ng/mL Optimal:                 > or = 30 ng/mL . For 25-OH Vitamin D testing on patients on  D2-supplementation and patients for whom quantitation  of D2 and D3 fractions is required, the QuestAssureD(TM) 25-OH VIT D, (D2,D3), LC/MS/MS  is recommended: order  code 21308 (patients >38yrs). . See Note 1 . Note 1 . For additional information, please refer to  http://education.Quest[facility].com/faq/FAQ199  (This link is being provided for informational/ educational purposes only.)

## 2023-03-15 ENCOUNTER — Other Ambulatory Visit: Payer: Self-pay | Admitting: Family Medicine

## 2023-03-26 ENCOUNTER — Encounter: Payer: Self-pay | Admitting: Family Medicine

## 2023-03-26 ENCOUNTER — Other Ambulatory Visit: Payer: Self-pay | Admitting: Family Medicine

## 2023-03-27 ENCOUNTER — Other Ambulatory Visit: Payer: Self-pay

## 2023-03-27 DIAGNOSIS — E559 Vitamin D deficiency, unspecified: Secondary | ICD-10-CM

## 2023-03-27 MED ORDER — VITAMIN D 25 MCG (1000 UNIT) PO TABS
2000.0000 [IU] | ORAL_TABLET | Freq: Every day | ORAL | 1 refills | Status: DC
Start: 2023-03-27 — End: 2023-07-31

## 2023-03-28 MED ORDER — VITAMIN D (ERGOCALCIFEROL) 1.25 MG (50000 UNIT) PO CAPS: 50000.0000 [IU] | ORAL_CAPSULE | ORAL | 0 refills | Status: DC

## 2023-03-28 MED ORDER — LORAZEPAM 0.5 MG PO TABS: 0.5000 mg | ORAL_TABLET | Freq: Three times a day (TID) | ORAL | 0 refills | Status: DC

## 2023-05-17 ENCOUNTER — Other Ambulatory Visit: Payer: Self-pay | Admitting: Family Medicine

## 2023-06-20 ENCOUNTER — Ambulatory Visit: Payer: Self-pay | Admitting: Family Medicine

## 2023-06-20 ENCOUNTER — Encounter: Payer: Self-pay | Admitting: Family Medicine

## 2023-06-20 VITALS — BP 122/78 | HR 86 | Temp 98.2°F | Ht 67.0 in | Wt 262.0 lb

## 2023-06-20 DIAGNOSIS — J069 Acute upper respiratory infection, unspecified: Secondary | ICD-10-CM | POA: Insufficient documentation

## 2023-06-20 DIAGNOSIS — M62838 Other muscle spasm: Secondary | ICD-10-CM | POA: Insufficient documentation

## 2023-06-20 MED ORDER — CYCLOBENZAPRINE HCL 10 MG PO TABS
10.0000 mg | ORAL_TABLET | Freq: Three times a day (TID) | ORAL | 0 refills | Status: AC | PRN
Start: 2023-06-20 — End: ?

## 2023-06-20 NOTE — Assessment & Plan Note (Signed)
Symptoms consistent with MSK, no bony tenderness or injury warranting x-ray. Will trial Flexeril. May continue Tylenol PRN. Return to PCP if symptoms persist or worsen.

## 2023-06-20 NOTE — Assessment & Plan Note (Signed)
Declines respiratory viral panel. Reassured patient that symptoms and exam findings are most consistent with a viral upper respiratory infection and explained lack of efficacy of antibiotics against viruses.  Discussed expected course and features suggestive of secondary bacterial infection.  Continue supportive care. Increase fluid intake with water or electrolyte solution like pedialyte. Encouraged acetaminophen as needed for fever/pain. Encouraged salt water gargling, chloraseptic spray and throat lozenges. Encouraged OTC guaifenesin. Encouraged saline sinus flushes and/or neti with humidified air.

## 2023-06-20 NOTE — Progress Notes (Signed)
Subjective:  HPI: Sonya Olson is a 43 y.o. female presenting on 06/20/2023 for No chief complaint on file.   HPI Patient is in today for cough, body aches, left mid back pain for a few days.  Denies nasal congestion or rhinorrhea, ear pain, sore throat, fever, chills, SOB, wheezing. She was in her mothers home who had the flu several days ago, declines flu testing today Denies heavy lifting or trauma. She did fall 3 weeks ago onto her side but this back pain did not start until a few days ago, she tripped. Denies saddle numbness, urinary or fecal incontinence. She does have history of left sided radiculopathy, no lower extremity weakness. Has tried Tylenol. Pain is worse with twisting or bending and rising from lying.   Review of Systems  All other systems reviewed and are negative.   Relevant past medical history reviewed and updated as indicated.   Past Medical History:  Diagnosis Date   Allergy    Anxiety    Phreesia 03/23/2020   Asthma    Depression    Phreesia 03/23/2020   Eczema    GERD (gastroesophageal reflux disease)    Phreesia 03/23/2020   Hydradenitis    on mtx and remicade [facility]   Kidney stones 2008   last episode   PCOS (polycystic ovarian syndrome)    Sleep apnea    Phreesia 03/23/2020     Past Surgical History:  Procedure Laterality Date   APPENDECTOMY     LITHOTRIPSY  2008   NECK SURGERY      Allergies and medications reviewed and updated.   Current Outpatient Medications:    acetaminophen (TYLENOL) 325 MG tablet, Take 650 mg by mouth every 6 (six) hours as needed for mild pain or headache., Disp: , Rfl:    albuterol (VENTOLIN HFA) 108 (90 Base) MCG/ACT inhaler, Inhale 1-2 puffs into the lungs every 6 (six) hours as needed for wheezing or shortness of breath., Disp: 1 each, Rfl: 3   cholecalciferol (VITAMIN D3) 25 MCG (1000 UNIT) tablet, Take 2 tablets (2,000 Units total) by mouth daily., Disp: 30 tablet, Rfl: 1   fluocinonide  cream (LIDEX) 0.05 %, Apply 1 application topically daily., Disp: , Rfl:    folic acid (FOLVITE) 1 MG tablet, Take 3 mg by mouth daily., Disp: , Rfl:    inFLIXimab in sodium chloride 0.9 %, Inject 5 mg/kg into the vein every 30 (thirty) days. remicade, Disp: , Rfl:    levocetirizine (XYZAL) 5 MG tablet, Take 1 tablet (5 mg total) by mouth every evening., Disp: 90 tablet, Rfl: 3   levonorgestrel-ethinyl estradiol (AVIANE,ALESSE,LESSINA) 0.1-20 MG-MCG tablet, Take 1 tablet by mouth., Disp: , Rfl:    loratadine (CLARITIN) 10 MG tablet, Take 10 mg by mouth daily., Disp: , Rfl:    LORazepam (ATIVAN) 0.5 MG tablet, Take 1 tablet (0.5 mg total) by mouth every 8 (eight) hours., Disp: 30 tablet, Rfl: 0   methotrexate (RHEUMATREX) 2.5 MG tablet, Take 6 tablets by mouth once a week. Takes on Saturdays, Disp: , Rfl:    montelukast (SINGULAIR) 10 MG tablet, Take 1 tablet (10 mg total) by mouth at bedtime., Disp: 90 tablet, Rfl: 3   pantoprazole (PROTONIX) 40 MG tablet, Take 1 tablet (40 mg total) by mouth daily., Disp: 90 tablet, Rfl: 3   REMICADE 100 MG injection, Inject into the vein., Disp: , Rfl:    Vitamin D, Ergocalciferol, (DRISDOL) 1.25 MG (50000 UNIT) CAPS capsule, Take 1 capsule (50,000 Units  total) by mouth every 7 (seven) days., Disp: 4 capsule, Rfl: 3   vortioxetine HBr (TRINTELLIX) 10 MG TABS tablet, Take 1 tablet (10 mg total) by mouth daily. Stop prozac, Disp: 30 tablet, Rfl: 11   cyclobenzaprine (FLEXERIL) 10 MG tablet, Take 1 tablet (10 mg total) by mouth 3 (three) times daily as needed for muscle spasms., Disp: 30 tablet, Rfl: 0   traMADol (ULTRAM) 50 MG tablet, Take 50 mg by mouth every 6 (six) hours as needed. (Patient not taking: Reported on 06/20/2023), Disp: , Rfl:    VYVANSE 40 MG capsule, Take 40 mg by mouth every morning. (Patient not taking: Reported on 06/20/2023), Disp: , Rfl:    WEGOVY 2.4 MG/0.75ML SOAJ, , Disp: , Rfl:   Allergies  Allergen Reactions   Bee Pollen    Dog  Epithelium Swelling   Other     Grass, trees, pet dander, dust     Strawberry Extract    Golimumab Itching    Objective:   BP 122/78   Pulse 86   Temp 98.2 F (36.8 C) (Oral)   Ht 5\' 7"  (1.702 m)   Wt 262 lb (118.8 kg)   LMP  (Approximate)   SpO2 99%   BMI 41.04 kg/m      06/20/2023    9:00 AM 10/31/2022   12:13 PM 10/31/2022   12:01 PM  Vitals with BMI  Height 5\' 7"     Weight 262 lbs  291 lbs  BMI 41.03    Systolic 122 136 578  Diastolic 78 78 100  Pulse 86  469     Physical Exam Vitals and nursing note reviewed.  Constitutional:      Appearance: Normal appearance. She is normal weight.  HENT:     Head: Normocephalic and atraumatic.     Right Ear: Tympanic membrane, ear canal and external ear normal.     Left Ear: Tympanic membrane, ear canal and external ear normal.     Nose: Congestion present.     Right Sinus: No maxillary sinus tenderness or frontal sinus tenderness.     Left Sinus: No maxillary sinus tenderness or frontal sinus tenderness.     Mouth/Throat:     Mouth: Mucous membranes are moist.     Pharynx: Oropharynx is clear.  Eyes:     Extraocular Movements: Extraocular movements intact.     Conjunctiva/sclera: Conjunctivae normal.     Pupils: Pupils are equal, round, and reactive to light.  Cardiovascular:     Rate and Rhythm: Normal rate and regular rhythm.     Pulses: Normal pulses.     Heart sounds: Normal heart sounds.  Pulmonary:     Effort: Pulmonary effort is normal.     Breath sounds: Normal breath sounds.  Musculoskeletal:     Cervical back: No tenderness.     Thoracic back: Tenderness (left sided) present.     Lumbar back: Normal.  Lymphadenopathy:     Cervical: No cervical adenopathy.  Skin:    General: Skin is warm and dry.  Neurological:     General: No focal deficit present.     Mental Status: She is alert and oriented to person, place, and time. Mental status is at baseline.  Psychiatric:        Mood and Affect: Mood  normal.        Behavior: Behavior normal.        Thought Content: Thought content normal.        Judgment: Judgment  normal.     Assessment & Plan:  Muscle spasm Assessment & Plan: Symptoms consistent with MSK, no bony tenderness or injury warranting x-ray. Will trial Flexeril. May continue Tylenol PRN. Return to PCP if symptoms persist or worsen.    Viral URI Assessment & Plan: Declines respiratory viral panel. Reassured patient that symptoms and exam findings are most consistent with a viral upper respiratory infection and explained lack of efficacy of antibiotics against viruses.  Discussed expected course and features suggestive of secondary bacterial infection.  Continue supportive care. Increase fluid intake with water or electrolyte solution like pedialyte. Encouraged acetaminophen as needed for fever/pain. Encouraged salt water gargling, chloraseptic spray and throat lozenges. Encouraged OTC guaifenesin. Encouraged saline sinus flushes and/or neti with humidified air.     Other orders -     Cyclobenzaprine HCl; Take 1 tablet (10 mg total) by mouth 3 (three) times daily as needed for muscle spasms.  Dispense: 30 tablet; Refill: 0     Follow up plan: Return if symptoms worsen or fail to improve.  Park Meo, FNP

## 2023-07-28 ENCOUNTER — Other Ambulatory Visit: Payer: Self-pay | Admitting: Family Medicine

## 2023-07-28 DIAGNOSIS — E559 Vitamin D deficiency, unspecified: Secondary | ICD-10-CM

## 2023-07-31 NOTE — Telephone Encounter (Signed)
 Requested medication (s) are due for refill today: yes  Requested medication (s) are on the active medication list: yes  Last refill:  03/27/23  Future visit scheduled: no  Notes to clinic:  Manual Review: Route requests for 50,000 IU strength to the provider      Requested Prescriptions  Pending Prescriptions Disp Refills   Vitamin D, Cholecalciferol, 25 MCG (1000 UT) TABS [Pharmacy Med Name: Vitamin D3 25 mcg (1,000 unit) tablet] 30 tablet 1    Sig: Take 2 tablets (2,000 Units total) by mouth daily.     Endocrinology:  Vitamins - Vitamin D Supplementation 2 Failed - 07/31/2023  8:52 AM      Failed - Manual Review: Route requests for 50,000 IU strength to the provider      Passed - Ca in normal range and within 360 days    Calcium  Date Value Ref Range Status  09/26/2022 9.1 8.6 - 10.2 mg/dL Final         Passed - Vitamin D in normal range and within 360 days    Vit D, 25-Hydroxy  Date Value Ref Range Status  09/26/2022 46 30 - 100 ng/mL Final    Comment:    Vitamin D Status         25-OH Vitamin D: . Deficiency:                    <20 ng/mL Insufficiency:             20 - 29 ng/mL Optimal:                 > or = 30 ng/mL . For 25-OH Vitamin D testing on patients on  D2-supplementation and patients for whom quantitation  of D2 and D3 fractions is required, the QuestAssureD(TM) 25-OH VIT D, (D2,D3), LC/MS/MS is recommended: order  code 16109 (patients >41yrs). . See Note 1 . Note 1 . For additional information, please refer to  http://education.Quest[facility].com/faq/FAQ199  (This link is being provided for informational/ educational purposes only.)          Passed - Valid encounter within last 12 months    Recent Outpatient Visits           1 month ago Muscle spasm   [employer] [facility] [facility] Park Meo, FNP   9 months ago Displaced fracture of fifth metatarsal bone, left foot, initial encounter for closed fracture   [place] [facility] Donita Brooks, MD   10 months ago Right foot pain   [place] [facility] Tanya Nones, Priscille Heidelberg, MD   11 months ago General medical exam   [place] [facility] Donita Brooks, MD   1 year ago Acute left-sided low back pain with left-sided sciatica   [place] Pickard, Priscille Heidelberg, MD

## 2023-08-05 ENCOUNTER — Other Ambulatory Visit: Payer: Self-pay | Admitting: Family Medicine

## 2023-08-07 NOTE — Telephone Encounter (Signed)
 Requested Prescriptions  Pending Prescriptions Disp Refills   levocetirizine (XYZAL) 5 MG tablet [Pharmacy Med Name: levocetirizine 5 mg tablet] 90 tablet 1    Sig: TAKE ONE TABLET BY MOUTH EVERY EVENING     Ear, Nose, and Throat:  Antihistamines - levocetirizine dihydrochloride Passed - 08/07/2023  7:47 AM      Passed - Cr in normal range and within 360 days    Creat  Date Value Ref Range Status  09/26/2022 0.77 0.50 - 0.99 mg/dL Final         Passed - eGFR is 10 or above and within 360 days    GFR calc Af Amer  Date Value Ref Range Status  12/27/2019 >60 >60 mL/min Final   GFR, Estimated  Date Value Ref Range Status  08/06/2021 >60 >60 mL/min Final    Comment:    (NOTE) Calculated using the CKD-EPI Creatinine Equation (2021)    eGFR  Date Value Ref Range Status  09/26/2022 98 > OR = 60 mL/min/1.68m2 Final         Passed - Valid encounter within last 12 months    Recent Outpatient Visits           1 month ago Muscle spasm   [place] [facility] [place] Meo, FNP   9 months ago Displaced fracture of fifth metatarsal bone, left foot, initial encounter for closed fracture   [place] [facility] Donita Brooks, MD   10 months ago Right foot pain   [place] [facility] Tanya Nones, Priscille Heidelberg, MD   12 months ago General medical exam   [place] [facility] Donita Brooks, MD   1 year ago Acute left-sided low back pain with left-sided sciatica   [place] [facility] Pickard, Priscille Heidelberg, MD

## 2023-09-07 ENCOUNTER — Other Ambulatory Visit: Payer: Self-pay | Admitting: Family Medicine

## 2023-09-07 ENCOUNTER — Ambulatory Visit: Payer: Self-pay | Admitting: Family Medicine

## 2023-09-07 NOTE — Telephone Encounter (Signed)
 Copied from CRM [id number]. Topic: Clinical - Medication Refill >> Sep 07, 2023  5:01 PM Tiffany S wrote: Medication: vortioxetine  HBr (TRINTELLIX ) 10 MG TABS tablet [[id number]]  Has the patient contacted their pharmacy? Yes (Agent: If no, request that the patient contact the pharmacy for the refill. If patient does not wish to contact the pharmacy document the reason why and proceed with request.) (Agent: If yes, when and what did the pharmacy advise?)  This is the patient's preferred pharmacy:  [facility] - [place], [place] - [address] [place] [address] Phone: [phone] Fax: [phone]  Is this the correct pharmacy for this prescription? Yes If no, delete pharmacy and type the correct one.   Has the prescription been filled recently? Yes  Is the patient out of the medication? Yes  Has the patient been seen for an appointment in the last year OR does the patient have an upcoming appointment? Yes  Can we respond through MyChart? Yes  Agent: Please be advised that Rx refills may take up to 3 business days. We ask that you follow-up with your pharmacy.

## 2023-09-08 ENCOUNTER — Other Ambulatory Visit: Payer: Self-pay | Admitting: Family Medicine

## 2023-09-10 NOTE — Telephone Encounter (Signed)
 Call to patient- appointment scheduled.  Requested Prescriptions  Pending Prescriptions Disp Refills   vortioxetine  HBr (TRINTELLIX ) 10 MG TABS tablet [Pharmacy Med Name: Trintellix  10 mg tablet] 30 tablet 0    Sig: TAKE 1 TABLET BY MOUTH DAILY. STOP PROZAC (FLUOXETINE ).     Psychiatry: Antidepressants - Serotonin Modulator Failed - 09/10/2023  3:11 PM      Failed - Valid encounter within last 6 months    Recent Outpatient Visits           2 months ago Muscle spasm   [place] [facility] Jenelle Mis, FNP   10 months ago Displaced fracture of fifth metatarsal bone, left foot, initial encounter for closed fracture   [place] [facility] Austine Lefort, MD   11 months ago Right foot pain   [place] [facility] Cheril Cork, Cisco Crest, MD   1 year ago General medical exam   [place] [facility] Austine Lefort, MD   1 year ago Acute left-sided low back pain with left-sided sciatica   [place] [facility] Austine Lefort, MD              Passed - Completed PHQ-2 or PHQ-9 in the last 360 days

## 2023-09-10 NOTE — Telephone Encounter (Signed)
 Requested Prescriptions  Pending Prescriptions Disp Refills   montelukast  (SINGULAIR ) 10 MG tablet [Pharmacy Med Name: montelukast  10 mg tablet] 90 tablet 3    Sig: TAKE 1 TABLET BY MOUTH AT BEDTIME     Pulmonology:  Leukotriene Inhibitors Passed - 09/10/2023  4:51 PM      Passed - Valid encounter within last 12 months    Recent Outpatient Visits           2 months ago Muscle spasm   [place] [facility] Jenelle Mis, FNP   10 months ago Displaced fracture of fifth metatarsal bone, left foot, initial encounter for closed fracture   [place] [facility] Austine Lefort, MD   11 months ago Right foot pain   [place] [facility] Cheril Cork, Cisco Crest, MD   1 year ago General medical exam   [place] [facility] Austine Lefort, MD   1 year ago Acute left-sided low back pain with left-sided sciatica   [place] [facility] Pickard, Cisco Crest, MD

## 2023-09-12 ENCOUNTER — Other Ambulatory Visit: Payer: Self-pay | Admitting: Family Medicine

## 2023-09-13 NOTE — Telephone Encounter (Signed)
 Requested Prescriptions  Pending Prescriptions Disp Refills   pantoprazole  (PROTONIX ) 40 MG tablet [Pharmacy Med Name: pantoprazole  40 mg tablet,delayed release] 90 tablet 1    Sig: TAKE 1 TABLET BY MOUTH ONCE A DAY.     Gastroenterology: Proton Pump Inhibitors Passed - 09/13/2023  3:18 PM      Passed - Valid encounter within last 12 months    Recent Outpatient Visits           2 months ago Muscle spasm   [place] [facility] Jenelle Mis, FNP   10 months ago Displaced fracture of fifth metatarsal bone, left foot, initial encounter for closed fracture   [place] [facility] Austine Lefort, MD   11 months ago Right foot pain   [place] [facility] Cheril Cork, Cisco Crest, MD   1 year ago General medical exam   [place] [facility] Austine Lefort, MD   1 year ago Acute left-sided low back pain with left-sided sciatica   [place] [facility] Pickard, Cisco Crest, MD

## 2023-09-30 ENCOUNTER — Emergency Department (HOSPITAL_COMMUNITY)

## 2023-09-30 ENCOUNTER — Emergency Department (HOSPITAL_COMMUNITY)
Admission: EM | Admit: 2023-09-30 | Discharge: 2023-09-30 | Disposition: A | Discharge status: Home / Self Care | Attending: Emergency Medicine | Admitting: Emergency Medicine

## 2023-09-30 ENCOUNTER — Encounter (HOSPITAL_COMMUNITY): Payer: Self-pay | Admitting: Emergency Medicine

## 2023-09-30 ENCOUNTER — Other Ambulatory Visit: Payer: Self-pay

## 2023-09-30 DIAGNOSIS — N200 Calculus of kidney: Secondary | ICD-10-CM

## 2023-09-30 DIAGNOSIS — R1032 Left lower quadrant pain: Secondary | ICD-10-CM | POA: Diagnosis present

## 2023-09-30 DIAGNOSIS — R109 Unspecified abdominal pain: Secondary | ICD-10-CM

## 2023-09-30 LAB — CBC WITH DIFFERENTIAL/PLATELET
Abs Immature Granulocytes: 0.03 10*3/uL (ref 0.00–0.07)
Basophils Absolute: 0 10*3/uL (ref 0.0–0.1)
Basophils Relative: 0 %
Eosinophils Absolute: 0.1 10*3/uL (ref 0.0–0.5)
Eosinophils Relative: 1 %
HCT: 35 % — ABNORMAL LOW (ref 36.0–46.0)
Hemoglobin: 12.1 g/dL (ref 12.0–15.0)
Immature Granulocytes: 0 %
Lymphocytes Relative: 16 %
Lymphs Abs: 2.1 10*3/uL (ref 0.7–4.0)
MCH: 26.2 pg (ref 26.0–34.0)
MCHC: 34.6 g/dL (ref 30.0–36.0)
MCV: 75.9 fL — ABNORMAL LOW (ref 80.0–100.0)
Monocytes Absolute: 1 10*3/uL (ref 0.1–1.0)
Monocytes Relative: 8 %
Neutro Abs: 10 10*3/uL — ABNORMAL HIGH (ref 1.7–7.7)
Neutrophils Relative %: 75 %
Platelets: 217 10*3/uL (ref 150–400)
RBC: 4.61 MIL/uL (ref 3.87–5.11)
RDW: 16.3 % — ABNORMAL HIGH (ref 11.5–15.5)
WBC: 13.3 10*3/uL — ABNORMAL HIGH (ref 4.0–10.5)
nRBC: 0 % (ref 0.0–0.2)

## 2023-09-30 LAB — URINALYSIS, ROUTINE W REFLEX MICROSCOPIC
Bacteria, UA: NONE SEEN
Bilirubin Urine: NEGATIVE
Glucose, UA: NEGATIVE mg/dL
Hgb urine dipstick: NEGATIVE
Ketones, ur: 20 mg/dL — AB
Nitrite: NEGATIVE
Protein, ur: NEGATIVE mg/dL
Specific Gravity, Urine: 1.02 (ref 1.005–1.030)
pH: 6 (ref 5.0–8.0)

## 2023-09-30 LAB — BASIC METABOLIC PANEL WITH GFR
Anion gap: 9 (ref 5–15)
BUN: 15 mg/dL (ref 6–20)
CO2: 22 mmol/L (ref 22–32)
Calcium: 8.7 mg/dL — ABNORMAL LOW (ref 8.9–10.3)
Chloride: 104 mmol/L (ref 98–111)
Creatinine, Ser: 0.97 mg/dL (ref 0.44–1.00)
GFR, Estimated: >60 mL/min (ref >60)
Glucose, Bld: 97 mg/dL (ref 70–99)
Potassium: 4 mmol/L (ref 3.5–5.1)
Sodium: 135 mmol/L (ref 135–145)

## 2023-09-30 LAB — POC URINE PREG, ED: Preg Test, Ur: NEGATIVE

## 2023-09-30 MED ORDER — ACETAMINOPHEN 500 MG PO TABS: 500.0000 mg | ORAL_TABLET | Freq: Four times a day (QID) | ORAL | 0 refills | Status: AC | PRN

## 2023-09-30 MED ORDER — OXYCODONE-ACETAMINOPHEN 5-325 MG PO TABS
1.0000 | ORAL_TABLET | Freq: Once | ORAL | Status: AC
  Administered 2023-09-30: 1 via ORAL
  Filled 2023-09-30: qty 1

## 2023-09-30 MED ORDER — OXYCODONE-ACETAMINOPHEN 5-325 MG PO TABS: 1.0000 | ORAL_TABLET | Freq: Three times a day (TID) | ORAL | 0 refills | Status: DC | PRN

## 2023-09-30 MED ORDER — HYDROMORPHONE HCL 1 MG/ML IJ SOLN
1.0000 mg | Freq: Once | INTRAMUSCULAR | Status: AC
  Administered 2023-09-30: 1 mg via INTRAVENOUS
  Filled 2023-09-30: qty 1

## 2023-09-30 MED ORDER — IBUPROFEN 600 MG PO TABS: 600.0000 mg | ORAL_TABLET | Freq: Three times a day (TID) | ORAL | 0 refills | Status: AC | PRN

## 2023-09-30 MED ORDER — ONDANSETRON 8 MG PO TBDP: 8.0000 mg | ORAL_TABLET | Freq: Three times a day (TID) | ORAL | 0 refills | Status: DC | PRN

## 2023-09-30 MED ORDER — KETOROLAC TROMETHAMINE 30 MG/ML IJ SOLN
15.0000 mg | Freq: Once | INTRAMUSCULAR | Status: AC
  Administered 2023-09-30: 15 mg via INTRAVENOUS
  Filled 2023-09-30 (×2): qty 1

## 2023-09-30 MED ORDER — ONDANSETRON HCL 4 MG/2ML IJ SOLN
4.0000 mg | Freq: Once | INTRAMUSCULAR | Status: AC
  Administered 2023-09-30: 4 mg via INTRAVENOUS
  Filled 2023-09-30: qty 2

## 2023-09-30 NOTE — ED Provider Notes (Signed)
 [place] EMERGENCY DEPARTMENT AT [facility] Provider Note   CSN: [id number] Arrival date & time: 09/30/23  0345     History  Chief Complaint  Patient presents with   Flank Pain    Sonya Olson is a 43 y.o. female.  Patient presents to the emergency department for evaluation of flank pain.  Patient reports left-sided flank pain that radiates into the left lower abdomen/groin area.  She reports nausea, no vomiting.  Patient reports that this feels like prior kidney stones that she has had.       Home Medications Prior to Admission medications   Medication Sig Start Date End Date Taking? Authorizing Provider  acetaminophen  (TYLENOL ) 325 MG tablet Take 650 mg by mouth every 6 (six) hours as needed for mild pain or headache.    [provider]  albuterol  (VENTOLIN  HFA) 108 (90 Base) MCG/ACT inhaler Inhale 1-2 puffs into the lungs every 6 (six) hours as needed for wheezing or shortness of breath. 04/28/21   Wilhemena Harbour, NP  cyclobenzaprine  (FLEXERIL ) 10 MG tablet Take 1 tablet (10 mg total) by mouth 3 (three) times daily as needed for muscle spasms. 06/20/23   Howard, Amber S, FNP  fluocinonide cream (LIDEX) 0.05 % Apply 1 application topically daily.    [provider]  folic acid  (FOLVITE ) 1 MG tablet Take 3 mg by mouth daily. 11/19/19   [provider]  inFLIXimab in sodium chloride  0.9 % Inject 5 mg/kg into the vein every 30 (thirty) days. remicade    [provider]  levocetirizine (XYZAL ) 5 MG tablet TAKE ONE TABLET BY MOUTH EVERY EVENING 08/07/23   Austine Lefort, MD  levonorgestrel -ethinyl estradiol (AVIANE,ALESSE,LESSINA) 0.1-20 MG-MCG tablet Take 1 tablet by mouth.    [provider]  loratadine  (CLARITIN ) 10 MG tablet Take 10 mg by mouth daily.    [provider]  LORazepam  (ATIVAN ) 0.5 MG tablet Take 1 tablet (0.5 mg total) by mouth every 8 (eight) hours. 03/28/23   Austine Lefort, MD  methotrexate  (RHEUMATREX) 2.5 MG tablet Take 6 tablets by mouth once a week. Takes on Saturdays 02/18/18   [provider]  montelukast  (SINGULAIR ) 10 MG tablet TAKE 1 TABLET BY MOUTH AT BEDTIME 09/10/23   Austine Lefort, MD  pantoprazole  (PROTONIX ) 40 MG tablet TAKE 1 TABLET BY MOUTH ONCE A DAY. 09/13/23   Austine Lefort, MD  REMICADE 100 MG injection Inject into the vein. 05/23/21   [provider]  traMADol (ULTRAM) 50 MG tablet Take 50 mg by mouth every 6 (six) hours as needed. Patient not taking: Reported on 06/20/2023 12/13/20   [provider]  Vitamin D , Cholecalciferol, 25 MCG (1000 UT) TABS Take 2 tablets (2,000 Units total) by mouth daily. 07/31/23   Austine Lefort, MD  Vitamin D , Ergocalciferol , (DRISDOL ) 1.25 MG (50000 UNIT) CAPS capsule Take 1 capsule (50,000 Units total) by mouth every 7 (seven) days. 05/17/23   Austine Lefort, MD  vortioxetine  HBr (TRINTELLIX ) 10 MG TABS tablet TAKE 1 TABLET BY MOUTH DAILY. STOP PROZAC (FLUOXETINE ). 09/10/23   Austine Lefort, MD  VYVANSE 40 MG capsule Take 40 mg by mouth every morning. Patient not taking: Reported on 06/20/2023 09/04/22   [provider]  WEGOVY  2.4 MG/0.75ML SOAJ  08/31/21   [provider]      Allergies    Bee pollen, Dog epithelium, Other, Strawberry extract, and Golimumab    Review of Systems   Review of  Systems  Physical Exam Updated Vital Signs BP (!) 145/83   Pulse 83   Temp 98 F (36.7 C) (Oral)   Resp 18   Ht 5\' 7"  (1.702 m)   Wt 113.4 kg   SpO2 98%   BMI 39.16 kg/m  Physical Exam Vitals and nursing note reviewed.  Constitutional:      General: She is not in acute distress.    Appearance: She is well-developed.  HENT:     Head: Normocephalic and atraumatic.     Mouth/Throat:     Mouth: Mucous membranes are moist.  Eyes:     General: Vision grossly intact. Gaze aligned appropriately.     Extraocular Movements: Extraocular movements intact.     Conjunctiva/sclera:  Conjunctivae normal.  Cardiovascular:     Rate and Rhythm: Normal rate and regular rhythm.     Pulses: Normal pulses.     Heart sounds: Normal heart sounds, S1 normal and S2 normal. No murmur heard.    No friction rub. No gallop.  Pulmonary:     Effort: Pulmonary effort is normal. No respiratory distress.     Breath sounds: Normal breath sounds.  Abdominal:     General: Bowel sounds are normal.     Palpations: Abdomen is soft.     Tenderness: There is no abdominal tenderness. There is no guarding or rebound.     Hernia: No hernia is present.  Musculoskeletal:        General: No swelling.     Cervical back: Full passive range of motion without pain, normal range of motion and neck supple. No spinous process tenderness or muscular tenderness. Normal range of motion.     Right lower leg: No edema.     Left lower leg: No edema.  Skin:    General: Skin is warm and dry.     Capillary Refill: Capillary refill takes less than 2 seconds.     Findings: No ecchymosis, erythema, rash or wound.  Neurological:     General: No focal deficit present.     Mental Status: She is alert and oriented to person, place, and time.     GCS: GCS eye subscore is 4. GCS verbal subscore is 5. GCS motor subscore is 6.     Cranial Nerves: Cranial nerves 2-12 are intact.     Sensory: Sensation is intact.     Motor: Motor function is intact.     Coordination: Coordination is intact.  Psychiatric:        Attention and Perception: Attention normal.        Mood and Affect: Mood normal.        Speech: Speech normal.        Behavior: Behavior normal.     ED Results / Procedures / Treatments   Labs (all labs ordered are listed, but only abnormal results are displayed) Labs Reviewed  POC URINE PREG, ED - Normal  URINALYSIS, ROUTINE W REFLEX MICROSCOPIC  CBC WITH DIFFERENTIAL/PLATELET  BASIC METABOLIC PANEL WITH GFR    EKG None  Radiology No results found.  Procedures Procedures    Medications  Ordered in ED Medications  HYDROmorphone  (DILAUDID ) injection 1 mg (1 mg Intravenous Given 09/30/23 0555)  ondansetron  (ZOFRAN ) injection 4 mg (4 mg Intravenous Given 09/30/23 0554)    ED Course/ Medical Decision Making/ A&P  Medical Decision Making Amount and/or Complexity of Data Reviewed Labs: ordered. Radiology: ordered.  Risk Prescription drug management.   Differential Diagnosis considered includes, but not limited to: Renal colic/kidney stone; pyelonephritis; aortic dissection; musculoskeletal pain.  Patient with history of kidney stones presents to the emergency department for evaluation of left flank pain.  She reports that this feels similar to prior episodes.  Patient with pain radiating to the groin without tenderness on examination.  Will evaluate kidney function, rule out infection by urinalysis.  CT renal stone study pending.  Will signout oncoming ER physician to follow results.        Final Clinical Impression(s) / ED Diagnoses Final diagnoses:  Left flank pain    Rx / DC Orders ED Discharge Orders     None         Ballard Bongo, MD 09/30/23 [phone]

## 2023-09-30 NOTE — ED Provider Notes (Signed)
  Physical Exam  BP (!) 142/88   Pulse 69   Temp 98.1 F (36.7 C) (Oral)   Resp 17   Ht 5\' 7"  (1.702 m)   Wt 113.4 kg   SpO2 93%   BMI 39.16 kg/m   Physical Exam  Procedures  Procedures  ED Course / MDM    Medical Decision Making Amount and/or Complexity of Data Reviewed Labs: ordered. Radiology: ordered.  Risk OTC drugs. Prescription drug management.   Patient CT scan positive for left-sided kidney stone.  She has previous history of multiple stones, one of them requiring stenting and lithotripsy.  Patient's pain was 8 out of 10 during reassessment.  I discussed case with Dr. Valeta Gaudier in the setting of patient having previous history of lithotripsy to see if it is okay to proceed with Toradol  in the setting of patient having UPJ stone that is close to 6 mm with severe pain.  Dr. Valeta Gaudier recommends we continue with pain management for now and having patient follow-up with urologist in the clinic.  She okays Toradol  use for now.  This recommendation discussed with the patient. She has history of gastric sleeve, but I informed her that 3 days of NSAID should not be detrimental especially if she continues to take her PPI.  The patient appears reasonably screened and/or stabilized for discharge and I doubt any other medical condition or other [facility] requiring further screening, evaluation, or treatment in the ED at this time prior to discharge.   Results from the ER workup discussed with the patient Olson to Olson and all questions answered to the best of my ability. The patient is safe for discharge with strict return precautions.        Sonya Face, MD 09/30/23 [phone]

## 2023-09-30 NOTE — ED Notes (Signed)
 Pt reminded that we need urine sample when she is able to provide

## 2023-09-30 NOTE — Discharge Instructions (Addendum)
 We saw you in the ER for the abdominal pain. Our results indicate that you have a kidney stone. We were able to get your pain is relative control, and we can safely send you home.  Take the meds prescribed. Set up an appointment with the Urologist. If the pain is unbearable, you start having fevers, chills, and are unable to keep any meds down - then return to the ER.

## 2023-09-30 NOTE — ED Notes (Signed)
 Patient  returned from CT with persistent 8/10 flank pain. MD notified.

## 2023-09-30 NOTE — ED Triage Notes (Signed)
 Pt with c/o L sided flank pain that started last night. Pt also c/o nausea. Pt with hx of kidney stones.

## 2023-10-02 ENCOUNTER — Other Ambulatory Visit: Payer: Self-pay

## 2023-10-02 DIAGNOSIS — N200 Calculus of kidney: Secondary | ICD-10-CM

## 2023-10-03 ENCOUNTER — Encounter: Payer: Self-pay | Admitting: Urology

## 2023-10-03 ENCOUNTER — Other Ambulatory Visit: Payer: Self-pay

## 2023-10-03 ENCOUNTER — Ambulatory Visit (HOSPITAL_COMMUNITY)
Admission: RE | Admit: 2023-10-03 | Discharge: 2023-10-03 | Disposition: A | Discharge status: Home / Self Care | Source: Ambulatory Visit | Attending: Urology | Admitting: Urology

## 2023-10-03 ENCOUNTER — Ambulatory Visit (INDEPENDENT_AMBULATORY_CARE_PROVIDER_SITE_OTHER): Admitting: Urology

## 2023-10-03 VITALS — BP 141/71 | HR 97

## 2023-10-03 DIAGNOSIS — N200 Calculus of kidney: Secondary | ICD-10-CM

## 2023-10-03 MED ORDER — TAMSULOSIN HCL 0.4 MG PO CAPS: 0.4000 mg | ORAL_CAPSULE | Freq: Every day | ORAL | 0 refills | Status: DC

## 2023-10-03 MED ORDER — ONDANSETRON 8 MG PO TBDP: 8.0000 mg | ORAL_TABLET | Freq: Three times a day (TID) | ORAL | 0 refills | Status: DC | PRN

## 2023-10-03 MED ORDER — OXYCODONE-ACETAMINOPHEN 5-325 MG PO TABS: 1.0000 | ORAL_TABLET | ORAL | 0 refills | Status: DC | PRN

## 2023-10-03 NOTE — Patient Instructions (Signed)

## 2023-10-03 NOTE — H&P (View-Only) (Signed)
 10/03/2023 9:52 AM   Sonya Olson [birth year] [id number]  Referring provider: Austine Lefort, MD [address] [place],  [place] [address]  Nephrolithiasis   HPI: Sonya Olson is a 42yo here for evaluation of nephrolithiasis. She presented to [facility] ER 6/1 due to worsening left flank pain and was diagnosed with a 5-21mm left proximal ureteral calculus. She has had multiple stone events in Sonya past. Her first stone event was 15 years ago. She has had ESWL and a stent in 2007. Her mother has has a hx of stones. She has not had any pain since yesterday.   PMH: Past Medical History:  Diagnosis Date   Allergy    Anxiety    Phreesia 03/23/2020   Asthma    Depression    [employer]reesia 03/23/2020   Eczema    GERD (gastroesophageal reflux disease)    [employer]ia 03/23/2020   Hydradenitis    on mtx and remicade through [facility]   Kidney stones 2008   last episode   PCOS (polycystic ovarian syndrome)    Sleep apnea    Phreesia 03/23/2020    Surgical History: Past Surgical History:  Procedure Laterality Date   APPENDECTOMY     LITHOTRIPSY  2008   NECK SURGERY      Home Medications:  Allergies as of 10/03/2023       Reactions   Bee Pollen    Dog Epithelium Swelling   Other    Grass, trees, pet dander, dust   Strawberry Extract    Golimumab Itching        Medication List        Accurate as of October 03, 2023  9:52 AM. If you have any questions, ask your nurse or doctor.          acetaminophen  500 MG tablet Commonly known as: TYLENOL  Take 1 tablet (500 mg total) by mouth every 6 (six) hours as needed.   albuterol  108 (90 Base) MCG/ACT inhaler Commonly known as: VENTOLIN  HFA Inhale 1-2 puffs into Sonya lungs every 6 (six) hours as needed for wheezing or shortness of breath.   cyclobenzaprine  10 MG tablet Commonly known as: FLEXERIL  Take 1 tablet (10 mg total) by mouth 3 (three) times daily as needed for muscle spasms.   fluocinonide cream 0.05 % Commonly known  as: LIDEX Apply 1 application topically daily.   folic acid  1 MG tablet Commonly known as: FOLVITE  Take 3 mg by mouth daily.   ibuprofen 600 MG tablet Commonly known as: ADVIL Take 1 tablet (600 mg total) by mouth every 8 (eight) hours as needed.   inFLIXimab in sodium chloride  0.9 % Inject 5 mg/kg into Sonya vein every 30 (thirty) days. remicade   levocetirizine 5 MG tablet Commonly known as: XYZAL  TAKE ONE TABLET BY MOUTH EVERY EVENING   levonorgestrel -ethinyl estradiol 0.1-20 MG-MCG tablet Commonly known as: ALESSE Take 1 tablet by mouth.   loratadine  10 MG tablet Commonly known as: CLARITIN  Take 10 mg by mouth daily.   LORazepam  0.5 MG tablet Commonly known as: ATIVAN  Take 1 tablet (0.5 mg total) by mouth every 8 (eight) hours.   methotrexate 2.5 MG tablet Commonly known as: RHEUMATREX Take 6 tablets by mouth once a week. Takes on Saturdays   montelukast  10 MG tablet Commonly known as: SINGULAIR  TAKE 1 TABLET BY MOUTH AT BEDTIME   ondansetron  8 MG disintegrating tablet Commonly known as: ZOFRAN -ODT Take 1 tablet (8 mg total) by mouth every 8 (eight) hours as  needed for nausea.   oxyCODONE -acetaminophen  5-325 MG tablet Commonly known as: PERCOCET/ROXICET Take 1 tablet by mouth every 8 (eight) hours as needed for severe pain (pain score 7-10).   pantoprazole  40 MG tablet Commonly known as: PROTONIX  TAKE 1 TABLET BY MOUTH ONCE A DAY.   Remicade 100 MG injection Generic drug: inFLIXimab Inject into Sonya vein.   traMADol 50 MG tablet Commonly known as: ULTRAM Take 50 mg by mouth every 6 (six) hours as needed.   Trintellix  10 MG Tabs tablet Generic drug: vortioxetine  HBr TAKE 1 TABLET BY MOUTH DAILY. STOP PROZAC (FLUOXETINE ).   Vitamin D  (Cholecalciferol) 25 MCG (1000 UT) Tabs Take 2 tablets (2,000 Units total) by mouth daily.   Vitamin D  (Ergocalciferol ) 1.25 MG (50000 UNIT) Caps capsule Commonly known as: DRISDOL  Take 1 capsule (50,000 Units total) by  mouth every 7 (seven) days.   Vyvanse 40 MG capsule Generic drug: lisdexamfetamine Take 40 mg by mouth every morning.   Wegovy  2.4 MG/0.75ML Soaj Generic drug: Semaglutide -Weight Management        Allergies:  Allergies  Allergen Reactions   Bee Pollen    Dog Epithelium Swelling   Other     Grass, trees, pet dander, dust     Strawberry Extract    Golimumab Itching    Family History: Family History  Problem Relation Age of Onset   Hypertension Mother    Diabetes Mother    Heart disease Mother 41       CABG @ 14   Thyroid  disease Sister    Kidney disease Sister        2 kidney transplants   Cancer Sister        OVARIAN   Alcohol abuse Father    Asthma Daughter    Hypertension Maternal Uncle    Diabetes Maternal Uncle    Heart disease Maternal Uncle 55       CABG @ 56   Stroke Maternal Grandmother    Hypertension Maternal Grandfather    Heart disease Maternal Grandfather 77       Died of MI @ 52--No Dxed CAD prior   Mental illness Maternal Uncle     Social History:  reports that she has never smoked. She has never used smokeless tobacco. She reports that she does not drink alcohol and does not use drugs.  ROS: All other review of systems were reviewed and are negative except what is noted above in HPI  Physical Exam: BP (!) 141/71   Pulse 97   Constitutional:  Alert and oriented, No acute distress. HEENT: [place] AT, moist mucus membranes.  Trachea midline, no masses. Cardiovascular: No clubbing, cyanosis, or edema. Respiratory: Normal respiratory effort, no increased work of breathing. GI: Abdomen is soft, nontender, nondistended, no abdominal masses GU: No CVA tenderness.  Lymph: No cervical or inguinal lymphadenopathy. Skin: No rashes, bruises or suspicious lesions. Neurologic: Grossly intact, no focal deficits, moving all 4 extremities. Psychiatric: Normal mood and affect.  Laboratory Data: Lab Results  Component Value Date   WBC 13.3 (H) 09/30/2023    HGB 12.1 09/30/2023   HCT 35.0 (L) 09/30/2023   MCV 75.9 (L) 09/30/2023   PLT 217 09/30/2023    Lab Results  Component Value Date   CREATININE 0.97 09/30/2023    No results found for: "PSA"  No results found for: "TESTOSTERONE"  Lab Results  Component Value Date   HGBA1C 5.9 (H) 09/26/2022    Urinalysis    Component Value Date/Time   COLORURINE  YELLOW 09/30/2023 0600   APPEARANCEUR CLEAR 09/30/2023 0600   LABSPEC 1.020 09/30/2023 0600   PHURINE 6.0 09/30/2023 0600   GLUCOSEU NEGATIVE 09/30/2023 0600   HGBUR NEGATIVE 09/30/2023 0600   BILIRUBINUR NEGATIVE 09/30/2023 0600   KETONESUR 20 (A) 09/30/2023 0600   PROTEINUR NEGATIVE 09/30/2023 0600   UROBILINOGEN 0.2 09/17/2009 1833   NITRITE NEGATIVE 09/30/2023 0600   LEUKOCYTESUR TRACE (A) 09/30/2023 0600    Lab Results  Component Value Date   BACTERIA NONE SEEN 09/30/2023    Pertinent Imaging: Ct 6/1 and KUB today: Images reviewed and discussed with Sonya Olson  Results for orders placed during Sonya hospital encounter of 12/27/19  DG Abdomen 1 View  Narrative CLINICAL DATA:  Initial evaluation for left ureteral stone  EXAM: ABDOMEN - 1 VIEW  COMPARISON:  Prior CT from 12/15/2019.  FINDINGS: Subtle 3 mm calcific density overlies Sonya left hemipelvis, favored to reflect distal migration of Sonya previously identified left ureteral stone. No other definite radiopaque calculi identified.  Bowel gas pattern is within normal limits. Sequelae of prior gastric bypass noted. Visceral shadows within normal limits.  Osteoarthritic changes noted about Sonya hips.  IMPRESSION: 1. Subtle 3 mm calcific density overlying Sonya left hemipelvis, favored to reflect distal migration of Sonya previously identified left ureteral stone. 2. Nonobstructive bowel gas pattern.   Electronically Signed By: Virgia Griffins M.D. On: 12/27/2019 07:55  No results found for this or any previous visit.  No results found for this  or any previous visit.  No results found for this or any previous visit.  No results found for this or any previous visit.  No results found for this or any previous visit.  No results found for this or any previous visit.  Results for orders placed during Sonya hospital encounter of 09/30/23  CT Renal Stone Study  Narrative CLINICAL DATA:  Left flank pain. Probable stone. Pain onset last night with nausea.  EXAM: CT ABDOMEN AND PELVIS WITHOUT CONTRAST  TECHNIQUE: Multidetector CT imaging of Sonya abdomen and pelvis was performed following Sonya standard protocol without IV contrast.  RADIATION DOSE REDUCTION: This exam was performed according to Sonya departmental dose-optimization program which includes automated exposure control, adjustment of the mA and/or kV according to Olson size and/or use of iterative reconstruction technique.  COMPARISON:  CTs without contrast 01/30/2020 and 12/15/2019  FINDINGS: Lower chest: Sonya cardiac size is normal. There is linear scarring or atelectasis in Sonya lung bases without infiltrate.  Hepatobiliary: Sonya liver is 18.2 cm in length with mild steatosis. No mass is seen without contrast.  There is mild dilatation of Sonya gallbladder without wall thickening or calcified stones. No biliary dilatation.  Pancreas: No mass is seen without contrast.  Spleen: Unremarkable without contrast.  No splenomegaly.  Adrenals/Urinary Tract: There is no adrenal mass. No contour deforming abnormality of either unenhanced kidney.  There is asymmetric left renal and perirenal edema, scattered perinephric fluid, and mild hydronephrosis.  There is a 5 x 5 x 3 mm left UPJ stone superimposing just inferior to Sonya mid left L3 transverse process.  Both ureters are otherwise clear. There is a 2 mm nonobstructive caliceal stone in Sonya inferior pole of Sonya left kidney.  No other intrarenal stones are seen bilaterally. Sonya bladder is unremarkable for Sonya  degree of distension.  Stomach/Bowel: Sleeve gastrectomy again noted and there appears to have been an interval gastric bypass.  No wall thickening or dilatation of Sonya GI tract is seen. An appendix is  not seen in this Olson. It is probably surgically absent.  There is mild fecal stasis. No evidence of colitis or diverticulitis. Scattered uncomplicated left colonic diverticula.  Vascular/Lymphatic: No significant vascular findings are present. No enlarged abdominal or pelvic lymph nodes.  Reproductive: Uterus and bilateral adnexa are unremarkable.  Other: None.  Musculoskeletal: No acute or significant osseous findings.  IMPRESSION: 1. 5 x 5 x 3 mm left UPJ stone with mild hydronephrosis, left renal and perirenal edema and scattered perinephric fluid. Correlate clinically for infectious complication. 2. 2 mm nonobstructive caliceal stone inferior pole left kidney. 3. Mild hepatic steatosis. 4. Mild gallbladder dilatation without wall thickening or calcified stones. 5. Constipation and diverticulosis. 6. 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   Electronically Signed By: Denman Fischer M.D. On: 09/30/2023 07:48   Assessment & Plan:    1. Kidney stones (Primary) -We discussed Sonya management of kidney stones. These options include observation, ureteroscopy, shockwave lithotripsy (ESWL) and percutaneous nephrolithotomy (PCNL). We discussed which options are relevant to Sonya Olson's stone(s). We discussed Sonya natural history of kidney stones as well as Sonya complications of untreated stones and Sonya impact on quality of life without treatment as well as with each of Sonya above listed treatments. We also discussed Sonya efficacy of each treatment in its ability to clear Sonya stone burden. With any of these management options I discussed Sonya signs and symptoms of infection and Sonya need for emergent treatment should these be experienced. For each option  we discussed Sonya ability of each procedure to clear Sonya Olson of their stone burden.   For observation I described Sonya risks which include but are not limited to silent renal damage, life-threatening infection, need for emergent surgery, failure to pass stone and pain.   For ureteroscopy I described Sonya risks which include bleeding, infection, damage to contiguous structures, positioning injury, ureteral stricture, ureteral avulsion, ureteral injury, need for prolonged ureteral stent, inability to perform ureteroscopy, need for an interval procedure, inability to clear stone burden, stent discomfort/pain, heart attack, stroke, pulmonary embolus and Sonya inherent risks with general anesthesia.   For shockwave lithotripsy I described Sonya risks which include arrhythmia, kidney contusion, kidney hemorrhage, need for transfusion, pain, inability to adequately break up stone, inability to pass stone fragments, Steinstrasse, infection associated with obstructing stones, need for alternate surgical procedure, need for repeat shockwave lithotripsy, MI, CVA, PE and Sonya inherent risks with anesthesia/conscious sedation.   For PCNL I described Sonya risks including positioning injury, pneumothorax, hydrothorax, need for chest tube, inability to clear stone burden, renal laceration, arterial venous fistula or malformation, need for embolization of kidney, loss of kidney or renal function, need for repeat procedure, need for prolonged nephrostomy tube, ureteral avulsion, MI, CVA, PE and Sonya inherent risks of general anesthesia.   - Sonya Olson would like to proceed with left ESWL   No follow-ups on file.  Johnie Nailer, MD  [facility] [place]

## 2023-10-03 NOTE — Progress Notes (Signed)
 10/03/2023 9:52 AM   [name] [birth year] [id number]  Referring provider: Austine Lefort, MD [address] [place],  [place] [address]  Nephrolithiasis   HPI: Ms Bullen is a 43yo here for evaluation of nephrolithiasis. She presented to the ER 6/1 due to worsening left flank pain and was diagnosed with a 5-21mm left proximal ureteral calculus. She has had multiple stone events in the past. Her first stone event was 15 years ago. She has had ESWL and a stent in 2007. Her mother has has a hx of stones. She has not had any pain since yesterday.   PMH: Past Medical History:  Diagnosis Date   Allergy    Anxiety    Phreesia 03/23/2020   Asthma    Depression    Phreesia 03/23/2020   Eczema    GERD (gastroesophageal reflux disease)    Phreesia 03/23/2020   Hydradenitis    on mtx and remicade [facility]   Kidney stones 2008   last episode   PCOS (polycystic ovarian syndrome)    Sleep apnea    Phreesia 03/23/2020    Surgical History: Past Surgical History:  Procedure Laterality Date   APPENDECTOMY     LITHOTRIPSY  2008   NECK SURGERY      Home Medications:  Allergies as of 10/03/2023       Reactions   Bee Pollen    Dog Epithelium Swelling   Other    Grass, trees, pet dander, dust   Strawberry Extract    Golimumab Itching        Medication List        Accurate as of October 03, 2023  9:52 AM. If you have any questions, ask your nurse or doctor.          acetaminophen  500 MG tablet Commonly known as: TYLENOL  Take 1 tablet (500 mg total) by mouth every 6 (six) hours as needed.   albuterol  108 (90 Base) MCG/ACT inhaler Commonly known as: VENTOLIN  HFA Inhale 1-2 puffs into the lungs every 6 (six) hours as needed for wheezing or shortness of breath.   cyclobenzaprine  10 MG tablet Commonly known as: FLEXERIL  Take 1 tablet (10 mg total) by mouth 3 (three) times daily as needed for muscle spasms.   fluocinonide cream 0.05 % Commonly known  as: LIDEX Apply 1 application topically daily.   folic acid  1 MG tablet Commonly known as: FOLVITE  Take 3 mg by mouth daily.   ibuprofen 600 MG tablet Commonly known as: ADVIL Take 1 tablet (600 mg total) by mouth every 8 (eight) hours as needed.   inFLIXimab in sodium chloride  0.9 % Inject 5 mg/kg into the vein every 30 (thirty) days. remicade   levocetirizine 5 MG tablet Commonly known as: XYZAL  TAKE ONE TABLET BY MOUTH EVERY EVENING   levonorgestrel -ethinyl estradiol 0.1-20 MG-MCG tablet Commonly known as: ALESSE Take 1 tablet by mouth.   loratadine  10 MG tablet Commonly known as: CLARITIN  Take 10 mg by mouth daily.   LORazepam  0.5 MG tablet Commonly known as: ATIVAN  Take 1 tablet (0.5 mg total) by mouth every 8 (eight) hours.   methotrexate 2.5 MG tablet Commonly known as: RHEUMATREX Take 6 tablets by mouth once a week. Takes on Saturdays   montelukast  10 MG tablet Commonly known as: SINGULAIR  TAKE 1 TABLET BY MOUTH AT BEDTIME   ondansetron  8 MG disintegrating tablet Commonly known as: ZOFRAN -ODT Take 1 tablet (8 mg total) by mouth every 8 (eight) hours as  needed for nausea.   oxyCODONE -acetaminophen  5-325 MG tablet Commonly known as: PERCOCET/ROXICET Take 1 tablet by mouth every 8 (eight) hours as needed for severe pain (pain score 7-10).   pantoprazole  40 MG tablet Commonly known as: PROTONIX  TAKE 1 TABLET BY MOUTH ONCE A DAY.   Remicade 100 MG injection Generic drug: inFLIXimab Inject into the vein.   traMADol 50 MG tablet Commonly known as: ULTRAM Take 50 mg by mouth every 6 (six) hours as needed.   Trintellix  10 MG Tabs tablet Generic drug: vortioxetine  HBr TAKE 1 TABLET BY MOUTH DAILY. STOP PROZAC (FLUOXETINE ).   Vitamin D  (Cholecalciferol) 25 MCG (1000 UT) Tabs Take 2 tablets (2,000 Units total) by mouth daily.   Vitamin D  (Ergocalciferol ) 1.25 MG (50000 UNIT) Caps capsule Commonly known as: DRISDOL  Take 1 capsule (50,000 Units total) by  mouth every 7 (seven) days.   Vyvanse 40 MG capsule Generic drug: lisdexamfetamine Take 40 mg by mouth every morning.   Wegovy  2.4 MG/0.75ML Soaj Generic drug: Semaglutide -Weight Management        Allergies:  Allergies  Allergen Reactions   Bee Pollen    Dog Epithelium Swelling   Other     Grass, trees, pet dander, dust     Strawberry Extract    Golimumab Itching    Family History: Family History  Problem Relation Age of Onset   Hypertension Mother    Diabetes Mother    Heart disease Mother 41       CABG @ 14   Thyroid  disease Sister    Kidney disease Sister        2 kidney transplants   Cancer Sister        OVARIAN   Alcohol abuse Father    Asthma Daughter    Hypertension Maternal Uncle    Diabetes Maternal Uncle    Heart disease Maternal Uncle 55       CABG @ 56   Stroke Maternal Grandmother    Hypertension Maternal Grandfather    Heart disease Maternal Grandfather 77       Died of MI @ 52--No Dxed CAD prior   Mental illness Maternal Uncle     Social History:  reports that she has never smoked. She has never used smokeless tobacco. She reports that she does not drink alcohol and does not use drugs.  ROS: All other review of systems were reviewed and are negative except what is noted above in HPI  Physical Exam: BP (!) 141/71   Pulse 97   Constitutional:  Alert and oriented, No acute distress. HEENT: [place] AT, moist mucus membranes.  Trachea midline, no masses. Cardiovascular: No clubbing, cyanosis, or edema. Respiratory: Normal respiratory effort, no increased work of breathing. GI: Abdomen is soft, nontender, nondistended, no abdominal masses GU: No CVA tenderness.  Lymph: No cervical or inguinal lymphadenopathy. Skin: No rashes, bruises or suspicious lesions. Neurologic: Grossly intact, no focal deficits, moving all 4 extremities. Psychiatric: Normal mood and affect.  Laboratory Data: Lab Results  Component Value Date   WBC 13.3 (H) 09/30/2023    HGB 12.1 09/30/2023   HCT 35.0 (L) 09/30/2023   MCV 75.9 (L) 09/30/2023   PLT 217 09/30/2023    Lab Results  Component Value Date   CREATININE 0.97 09/30/2023    No results found for: "PSA"  No results found for: "TESTOSTERONE"  Lab Results  Component Value Date   HGBA1C 5.9 (H) 09/26/2022    Urinalysis    Component Value Date/Time   COLORURINE  YELLOW 09/30/2023 0600   APPEARANCEUR CLEAR 09/30/2023 0600   LABSPEC 1.020 09/30/2023 0600   PHURINE 6.0 09/30/2023 0600   GLUCOSEU NEGATIVE 09/30/2023 0600   HGBUR NEGATIVE 09/30/2023 0600   BILIRUBINUR NEGATIVE 09/30/2023 0600   KETONESUR 20 (A) 09/30/2023 0600   PROTEINUR NEGATIVE 09/30/2023 0600   UROBILINOGEN 0.2 09/17/2009 1833   NITRITE NEGATIVE 09/30/2023 0600   LEUKOCYTESUR TRACE (A) 09/30/2023 0600    Lab Results  Component Value Date   BACTERIA NONE SEEN 09/30/2023    Pertinent Imaging: Ct 6/1 and KUB today: Images reviewed and discussed with the patient  Results for orders placed during the hospital encounter of 12/27/19  DG Abdomen 1 View  Narrative CLINICAL DATA:  Initial evaluation for left ureteral stone  EXAM: ABDOMEN - 1 VIEW  COMPARISON:  Prior CT from 12/15/2019.  FINDINGS: Subtle 3 mm calcific density overlies the left hemipelvis, favored to reflect distal migration of the previously identified left ureteral stone. No other definite radiopaque calculi identified.  Bowel gas pattern is within normal limits. Sequelae of prior gastric bypass noted. Visceral shadows within normal limits.  Osteoarthritic changes noted about the hips.  IMPRESSION: 1. Subtle 3 mm calcific density overlying the left hemipelvis, favored to reflect distal migration of the previously identified left ureteral stone. 2. Nonobstructive bowel gas pattern.   Electronically Signed By: Virgia Griffins M.D. On: 12/27/2019 07:55  No results found for this or any previous visit.  No results found for this  or any previous visit.  No results found for this or any previous visit.  No results found for this or any previous visit.  No results found for this or any previous visit.  No results found for this or any previous visit.  Results for orders placed during the hospital encounter of 09/30/23  CT Renal Stone Study  Narrative CLINICAL DATA:  Left flank pain. Probable stone. Pain onset last night with nausea.  EXAM: CT ABDOMEN AND PELVIS WITHOUT CONTRAST  TECHNIQUE: Multidetector CT imaging of the abdomen and pelvis was performed following the standard protocol without IV contrast.  RADIATION DOSE REDUCTION: This exam was performed according to the departmental dose-optimization program which includes automated exposure control, adjustment of the mA and/or kV according to patient size and/or use of iterative reconstruction technique.  COMPARISON:  CTs without contrast 01/30/2020 and 12/15/2019  FINDINGS: Lower chest: The cardiac size is normal. There is linear scarring or atelectasis in the lung bases without infiltrate.  Hepatobiliary: The liver is 18.2 cm in length with mild steatosis. No mass is seen without contrast.  There is mild dilatation of the gallbladder without wall thickening or calcified stones. No biliary dilatation.  Pancreas: No mass is seen without contrast.  Spleen: Unremarkable without contrast.  No splenomegaly.  Adrenals/Urinary Tract: There is no adrenal mass. No contour deforming abnormality of either unenhanced kidney.  There is asymmetric left renal and perirenal edema, scattered perinephric fluid, and mild hydronephrosis.  There is a 5 x 5 x 3 mm left UPJ stone superimposing just inferior to the mid left L3 transverse process.  Both ureters are otherwise clear. There is a 2 mm nonobstructive caliceal stone in the inferior pole of the left kidney.  No other intrarenal stones are seen bilaterally. The bladder is unremarkable for the  degree of distension.  Stomach/Bowel: Sleeve gastrectomy again noted and there appears to have been an interval gastric bypass.  No wall thickening or dilatation of the GI tract is seen. An appendix is  not seen in this patient. It is probably surgically absent.  There is mild fecal stasis. No evidence of colitis or diverticulitis. Scattered uncomplicated left colonic diverticula.  Vascular/Lymphatic: No significant vascular findings are present. No enlarged abdominal or pelvic lymph nodes.  Reproductive: Uterus and bilateral adnexa are unremarkable.  Other: None.  Musculoskeletal: No acute or significant osseous findings.  IMPRESSION: 1. 5 x 5 x 3 mm left UPJ stone with mild hydronephrosis, left renal and perirenal edema and scattered perinephric fluid. Correlate clinically for infectious complication. 2. 2 mm nonobstructive caliceal stone inferior pole left kidney. 3. Mild hepatic steatosis. 4. Mild gallbladder dilatation without wall thickening or calcified stones. 5. Constipation and diverticulosis. 6. 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   Electronically Signed By: Denman Fischer M.D. On: 09/30/2023 07:48   Assessment & Plan:    1. Kidney stones (Primary) -We discussed the management of kidney stones. These options include observation, ureteroscopy, shockwave lithotripsy (ESWL) and percutaneous nephrolithotomy (PCNL). We discussed which options are relevant to the patient's stone(s). We discussed the natural history of kidney stones as well as the complications of untreated stones and the impact on quality of life without treatment as well as with each of the above listed treatments. We also discussed the efficacy of each treatment in its ability to clear the stone burden. With any of these management options I discussed the signs and symptoms of infection and the need for emergent treatment should these be experienced. For each option  we discussed the ability of each procedure to clear the patient of their stone burden.   For observation I described the risks which include but are not limited to silent renal damage, life-threatening infection, need for emergent surgery, failure to pass stone and pain.   For ureteroscopy I described the risks which include bleeding, infection, damage to contiguous structures, positioning injury, ureteral stricture, ureteral avulsion, ureteral injury, need for prolonged ureteral stent, inability to perform ureteroscopy, need for an interval procedure, inability to clear stone burden, stent discomfort/pain, heart attack, stroke, pulmonary embolus and the inherent risks with general anesthesia.   For shockwave lithotripsy I described the risks which include arrhythmia, kidney contusion, kidney hemorrhage, need for transfusion, pain, inability to adequately break up stone, inability to pass stone fragments, Steinstrasse, infection associated with obstructing stones, need for alternate surgical procedure, need for repeat shockwave lithotripsy, MI, CVA, PE and the inherent risks with anesthesia/conscious sedation.   For PCNL I described the risks including positioning injury, pneumothorax, hydrothorax, need for chest tube, inability to clear stone burden, renal laceration, arterial venous fistula or malformation, need for embolization of kidney, loss of kidney or renal function, need for repeat procedure, need for prolonged nephrostomy tube, ureteral avulsion, MI, CVA, PE and the inherent risks of general anesthesia.   - The patient would like to proceed with left ESWL   No follow-ups on file.  Johnie Nailer, MD  [facility] [place]

## 2023-10-04 ENCOUNTER — Encounter: Payer: Self-pay | Admitting: Family Medicine

## 2023-10-04 ENCOUNTER — Ambulatory Visit: Admitting: Family Medicine

## 2023-10-04 VITALS — BP 124/82 | HR 90 | Temp 98.7°F | Ht 67.0 in | Wt 254.8 lb

## 2023-10-04 DIAGNOSIS — R7303 Prediabetes: Secondary | ICD-10-CM | POA: Diagnosis not present

## 2023-10-04 DIAGNOSIS — Z13220 Encounter for screening for lipoid disorders: Secondary | ICD-10-CM | POA: Diagnosis not present

## 2023-10-04 DIAGNOSIS — E049 Nontoxic goiter, unspecified: Secondary | ICD-10-CM

## 2023-10-04 DIAGNOSIS — E559 Vitamin D deficiency, unspecified: Secondary | ICD-10-CM

## 2023-10-04 DIAGNOSIS — M51362 Other intervertebral disc degeneration, lumbar region with discogenic back pain and lower extremity pain: Secondary | ICD-10-CM

## 2023-10-04 DIAGNOSIS — Z0001 Encounter for general adult medical examination with abnormal findings: Secondary | ICD-10-CM

## 2023-10-04 DIAGNOSIS — Z0000 Encounter for general adult medical examination without abnormal findings: Secondary | ICD-10-CM

## 2023-10-04 NOTE — Progress Notes (Signed)
 Subjective:    Patient ID: Sonya Olson, female    DOB: 1980/11/15, 43 y.o.   MRN: [id number]  HPI  Patient is a very pleasant 43 year old African-American female who is here today for CPE.  Past medical history is significant for gastric bypass. Past medical history significant for hidradenitis suppurativa for which she is on chronic immunosuppression with Remicade.  She also has a history of persistent vit D deficiency.  This time 1 year ago, the patient weighed 288 pounds Wt Readings from Last 3 Encounters:  10/04/23 254 lb 12.8 oz (115.6 kg)  09/30/23 250 lb (113.4 kg)  06/20/23 262 lb (118.8 kg)   She is currently weighing 254 pounds.  Unfortunately, the patient has recently been diagnosed 6 mm kidney stone.  She is scheduled for lithotripsy within the next week.  She also complains of left-sided sciatica for several months.  She had an x-ray in March of last year which showed degenerative disc disease in her lower back.  She reports aching throbbing pain radiating down the posterior lateral aspect of her left leg from her gluteus to her left foot on a daily basis.  At times she states the pain can be severe.  She has tried physical therapy in the past.  Nothing seems to help the pain.  She sees her gynecologist who performed her Pap smear and mammogram earlier this spring.  She is due for a colonoscopy after October.  She is immunosuppressed due to the presence of methotrexate and Remicade but she politely declines a pneumonia vaccine.  On her physical exam today she appears to have a goiter although I do not appreciate any nodularity  Past Medical History:  Diagnosis Date   Allergy    Anxiety    Phreesia 03/23/2020   Asthma    Depression    Phreesia 03/23/2020   Eczema    GERD (gastroesophageal reflux disease)    Phreesia 03/23/2020   Hydradenitis    on [facility]tx and [facility]icade [facility]   Kidney stones 2008   last episode   PCOS (polycystic ovarian syndrome)    Sleep apnea     Phreesia 03/23/2020   Past Surgical History:  Procedure Laterality Date   APPENDECTOMY     LITHOTRIPSY  2008   NECK SURGERY     Current Outpatient Medications on File Prior to Visit  Medication Sig Dispense Refill   acetaminophen  (TYLENOL ) 500 MG tablet Take 1 tablet (500 mg total) by mouth every 6 (six) hours as needed. 30 tablet 0   albuterol  (VENTOLIN  HFA) 108 (90 Base) MCG/ACT inhaler Inhale 1-2 puffs into the lungs every 6 (six) hours as needed for wheezing or shortness of breath. 1 each 3   cyclobenzaprine  (FLEXERIL ) 10 MG tablet Take 1 tablet (10 mg total) by mouth 3 (three) times daily as needed for muscle spasms. 30 tablet 0   fluocinonide cream (LIDEX) 0.05 % Apply 1 application topically daily.     folic acid  (FOLVITE ) 1 MG tablet Take 3 mg by mouth daily.     ibuprofen (ADVIL) 600 MG tablet Take 1 tablet (600 mg total) by mouth every 8 (eight) hours as needed. 15 tablet 0   inFLIXimab in sodium chloride  0.9 % Inject 5 mg/kg into the vein every 30 (thirty) days. remicade     levocetirizine (XYZAL ) 5 MG tablet TAKE ONE TABLET BY MOUTH EVERY EVENING 90 tablet 1   levonorgestrel -ethinyl estradiol (AVIANE,ALESSE,LESSINA) 0.1-20 MG-MCG tablet Take 1 tablet by mouth.  loratadine  (CLARITIN ) 10 MG tablet Take 10 mg by mouth daily.     LORazepam  (ATIVAN ) 0.5 MG tablet Take 1 tablet (0.5 mg total) by mouth every 8 (eight) hours. 30 tablet 0   methotrexate (RHEUMATREX) 2.5 MG tablet Take 6 tablets by mouth once a week. Takes on Saturdays     montelukast  (SINGULAIR ) 10 MG tablet TAKE 1 TABLET BY MOUTH AT BEDTIME 90 tablet 0   ondansetron  (ZOFRAN -ODT) 8 MG disintegrating tablet Take 1 tablet (8 mg total) by mouth every 8 (eight) hours as needed for nausea. 30 tablet 0   oxyCODONE -acetaminophen  (PERCOCET/ROXICET) 5-325 MG tablet Take 1 tablet by mouth every 4 (four) hours as needed for severe pain (pain score 7-10). 30 tablet 0   pantoprazole  (PROTONIX ) 40 MG tablet TAKE 1 TABLET BY MOUTH  ONCE A DAY. 90 tablet 1   REMICADE 100 MG injection Inject into the vein.     tamsulosin  (FLOMAX ) 0.4 MG CAPS capsule Take 1 capsule (0.4 mg total) by mouth daily. 30 capsule 0   traMADol (ULTRAM) 50 MG tablet Take 50 mg by mouth every 6 (six) hours as needed.     Vitamin D , Cholecalciferol, 25 MCG (1000 UT) TABS Take 2 tablets (2,000 Units total) by mouth daily. 30 tablet 1   Vitamin D , Ergocalciferol , (DRISDOL ) 1.25 MG (50000 UNIT) CAPS capsule Take 1 capsule (50,000 Units total) by mouth every 7 (seven) days. 4 capsule 3   vortioxetine  HBr (TRINTELLIX ) 10 MG TABS tablet TAKE 1 TABLET BY MOUTH DAILY. STOP PROZAC (FLUOXETINE ). 30 tablet 0   VYVANSE 40 MG capsule Take 40 mg by mouth every morning.     No current facility-administered medications on file prior to visit.   Allergies  Allergen Reactions   Other Swelling    Grass, trees, pet dander, dust   Bee Pollen    Dog Epithelium Swelling   Strawberry Extract    Golimumab Itching   Social History   Socioeconomic History   Marital status: Married    Spouse name: Not on file   Number of children: Not on file   Years of education: Not on file   Highest education level: Bachelor's degree (e.g., BA, AB, BS)  Occupational History   Not on file  Tobacco Use   Smoking status: Never   Smokeless tobacco: Never  Vaping Use   Vaping status: Never Used  Substance and Sexual Activity   Alcohol use: No   Drug use: No   Sexual activity: Yes    Partners: Male    Birth control/protection: Pill  Other Topics Concern   Not on file  Social History Narrative   Entered 09/2017:   Married.   One child daughter age 35   Works at [employer] in [employer]   Social Drivers of [employer]: Low Risk  (09/26/2022)   Overall Financial Resource Strain (CARDIA)    Difficulty of Paying Living Expenses: Not hard at all  Food Insecurity: Low Risk  (01/09/2023)   Received from [facility]   Hunger Vital Sign     Worried About Running Out of Food in the Last Year: Never true    Ran Out of Food in the Last Year: Never true  Transportation Needs: No Transportation Needs (01/09/2023)   Received from [facility]    In the past 12 months, has lack of reliable transportation kept you from medical appointments, meetings, work or from getting things needed for daily living? :  No  Physical Activity: Unknown (09/26/2022)   Exercise Vital Sign    Days of Exercise per Week: 0 days    Minutes of Exercise per Session: Not on file  Stress: No Stress Concern Present (09/26/2022)   [employer] of Occupational Health - Occupational Stress Questionnaire    Feeling of Stress : Not at all  Social Connections: Socially Integrated (09/26/2022)   Social Connection and Isolation Panel [NHANES]    Frequency of Communication with Friends and Family: More than three times a week    Frequency of Social Gatherings with Friends and Family: More than three times a week    Attends Religious Services: More than 4 times per year    Active Member of [employer] or Organizations: Yes    Attends Engineer, structural: More than 4 times per year    Marital Status: Married  Catering manager Violence: Not on file     Review of Systems  All other systems reviewed and are negative.      Objective:   Physical Exam Vitals reviewed.  Constitutional:      General: She is not in acute distress.    Appearance: She is obese. She is not ill-appearing, toxic-appearing or diaphoretic.  Neck:     Thyroid : Thyromegaly present.     Vascular: No carotid bruit.  Cardiovascular:     Rate and Rhythm: Normal rate and regular rhythm.     Pulses: Normal pulses.     Heart sounds: Normal heart sounds. No murmur heard.    No gallop.  Pulmonary:     Effort: Pulmonary effort is normal. No respiratory distress.     Breath sounds: Normal breath sounds. No stridor. No wheezing or rhonchi.  Abdominal:     General: Abdomen  is flat. Bowel sounds are normal. There is no distension.     Palpations: Abdomen is soft.     Tenderness: There is no abdominal tenderness. There is no guarding or rebound.  Musculoskeletal:     Lumbar back: Decreased range of motion.     Right lower leg: No edema.     Left lower leg: No edema.       Legs:  Neurological:     Mental Status: She is alert.           Assessment & Plan:  Screening cholesterol level - Plan: Hemoglobin A1c, Lipid panel  Vitamin D  deficiency - Plan: VITAMIN D  25 Hydroxy (Vit-D Deficiency, Fractures)  Prediabetes - Plan: Hemoglobin A1c, Lipid panel  Degeneration of intervertebral disc of lumbar region with discogenic back pain and lower extremity pain - Plan: MR Lumbar Spine Wo Contrast  Goiter - Plan: US  THYROID  Patient's Pap smear and her mammogram are up-to-date.  After she turns 43 she will contact me and we can schedule her for a colonoscopy.  I recommended the pneumonia vaccine but she declines that today.  I will check her cholesterol.  She has a history of prediabetes so I will monitor her hemoglobin A1c.  She has a history of vitamin D  deficiency so I will also check a vitamin D  level.  Given the goiter that I appreciate on exam we will do a check a thyroid  ultrasound to rule out any nodules.  Patient has lumbar radiculopathy that has failed greater than 6 weeks of conservative therapy.  She has been dealing with the pain off and on for more than a year.  Therefore I will schedule her for an MRI of the lumbar spine.  She is interested in epidural steroid injections if a source of the pain is identified

## 2023-10-05 ENCOUNTER — Encounter (HOSPITAL_COMMUNITY)
Admission: RE | Admit: 2023-10-05 | Discharge: 2023-10-05 | Disposition: A | Discharge status: Home / Self Care | Source: Ambulatory Visit | Attending: Urology | Admitting: Urology

## 2023-10-05 ENCOUNTER — Ambulatory Visit: Payer: Self-pay | Admitting: Family Medicine

## 2023-10-05 ENCOUNTER — Encounter (HOSPITAL_COMMUNITY): Payer: Self-pay

## 2023-10-05 ENCOUNTER — Other Ambulatory Visit: Payer: Self-pay

## 2023-10-05 LAB — HEMOGLOBIN A1C
Hgb A1c MFr Bld: 5.7 % — ABNORMAL HIGH (ref <5.7)
Mean Plasma Glucose: 117 mg/dL
eAG (mmol/L): 6.5 mmol/L

## 2023-10-05 LAB — LIPID PANEL
Cholesterol: 148 mg/dL (ref <200)
HDL: 45 mg/dL — ABNORMAL LOW (ref >=50)
LDL Cholesterol (Calc): 87 mg/dL
Non-HDL Cholesterol (Calc): 103 mg/dL (ref <130)
Total CHOL/HDL Ratio: 3.3 (calc) (ref <5.0)
Triglycerides: 69 mg/dL (ref <150)

## 2023-10-05 LAB — VITAMIN D 25 HYDROXY (VIT D DEFICIENCY, FRACTURES): Vit D, 25-Hydroxy: 36 ng/mL (ref 30–100)

## 2023-10-07 ENCOUNTER — Encounter: Payer: Self-pay | Admitting: Family Medicine

## 2023-10-09 ENCOUNTER — Encounter (HOSPITAL_COMMUNITY)
Admission: RE | Disposition: A | Discharge status: Home / Self Care | Payer: Self-pay | Source: Home / Self Care | Attending: Urology

## 2023-10-09 ENCOUNTER — Other Ambulatory Visit: Payer: Self-pay

## 2023-10-09 ENCOUNTER — Encounter (HOSPITAL_COMMUNITY): Payer: Self-pay | Admitting: Urology

## 2023-10-09 ENCOUNTER — Ambulatory Visit (HOSPITAL_COMMUNITY)
Admission: RE | Admit: 2023-10-09 | Discharge: 2023-10-09 | Disposition: A | Discharge status: Home / Self Care | Attending: Urology | Admitting: Urology

## 2023-10-09 ENCOUNTER — Ambulatory Visit (HOSPITAL_COMMUNITY)

## 2023-10-09 DIAGNOSIS — Z841 Family history of disorders of kidney and ureter: Secondary | ICD-10-CM | POA: Diagnosis not present

## 2023-10-09 DIAGNOSIS — J45909 Unspecified asthma, uncomplicated: Secondary | ICD-10-CM | POA: Insufficient documentation

## 2023-10-09 DIAGNOSIS — E669 Obesity, unspecified: Secondary | ICD-10-CM | POA: Insufficient documentation

## 2023-10-09 DIAGNOSIS — G4730 Sleep apnea, unspecified: Secondary | ICD-10-CM | POA: Insufficient documentation

## 2023-10-09 DIAGNOSIS — Z01818 Encounter for other preprocedural examination: Secondary | ICD-10-CM

## 2023-10-09 DIAGNOSIS — F32A Depression, unspecified: Secondary | ICD-10-CM | POA: Diagnosis not present

## 2023-10-09 DIAGNOSIS — N132 Hydronephrosis with renal and ureteral calculous obstruction: Secondary | ICD-10-CM | POA: Diagnosis present

## 2023-10-09 DIAGNOSIS — N201 Calculus of ureter: Secondary | ICD-10-CM

## 2023-10-09 HISTORY — PX: EXTRACORPOREAL SHOCK WAVE LITHOTRIPSY: SHX1557

## 2023-10-09 LAB — POCT PREGNANCY, URINE: Preg Test, Ur: NEGATIVE

## 2023-10-09 SURGERY — LITHOTRIPSY, ESWL
Anesthesia: LOCAL | Laterality: Left

## 2023-10-09 MED ORDER — DIAZEPAM 5 MG PO TABS
10.0000 mg | ORAL_TABLET | Freq: Once | ORAL | Status: AC
  Administered 2023-10-09: 10 mg via ORAL
  Filled 2023-10-09: qty 2

## 2023-10-09 MED ORDER — TAMSULOSIN HCL 0.4 MG PO CAPS: 0.4000 mg | ORAL_CAPSULE | Freq: Every day | ORAL | 0 refills | Status: AC

## 2023-10-09 MED ORDER — ONDANSETRON 8 MG PO TBDP: 8.0000 mg | ORAL_TABLET | Freq: Three times a day (TID) | ORAL | 0 refills | Status: AC | PRN

## 2023-10-09 MED ORDER — DIPHENHYDRAMINE HCL 25 MG PO CAPS
25.0000 mg | ORAL_CAPSULE | ORAL | Status: AC
Start: 2023-10-09 — End: 2023-10-09
  Administered 2023-10-09: 25 mg via ORAL
  Filled 2023-10-09: qty 1

## 2023-10-09 MED ORDER — OXYCODONE-ACETAMINOPHEN 5-325 MG PO TABS: 1.0000 | ORAL_TABLET | ORAL | 0 refills | Status: DC | PRN

## 2023-10-09 NOTE — Progress Notes (Signed)
 Pt was able to empty her bladder.  Blood tinged urine noted.

## 2023-10-09 NOTE — Interval H&P Note (Signed)
 History and Physical Interval Note:  10/09/2023 9:32 AM  [name]  has presented today for surgery, with the diagnosis of l ureteral stone.  The various methods of treatment have been discussed with the patient and family. After consideration of risks, benefits and other options for treatment, the patient has consented to  Procedure(s): LITHOTRIPSY, ESWL (Left) as a surgical intervention.  The patient's history has been reviewed, patient examined, no change in status, stable for surgery.  I have reviewed the patient's chart and labs.  Questions were answered to the patient's satisfaction.     Sonya Olson

## 2023-10-10 ENCOUNTER — Other Ambulatory Visit: Payer: Self-pay

## 2023-10-10 ENCOUNTER — Encounter (HOSPITAL_COMMUNITY): Payer: Self-pay | Admitting: Urology

## 2023-10-10 MED ORDER — LEVOCETIRIZINE DIHYDROCHLORIDE 5 MG PO TABS: 5.0000 mg | ORAL_TABLET | Freq: Every evening | ORAL | 3 refills | Status: AC

## 2023-10-10 MED ORDER — PANTOPRAZOLE SODIUM 40 MG PO TBEC: 40.0000 mg | DELAYED_RELEASE_TABLET | Freq: Every day | ORAL | 3 refills | Status: AC

## 2023-10-10 MED ORDER — VORTIOXETINE HBR 10 MG PO TABS: 10.0000 mg | ORAL_TABLET | Freq: Every day | ORAL | 3 refills | Status: AC

## 2023-10-11 MED ORDER — LORAZEPAM 0.5 MG PO TABS: 0.5000 mg | ORAL_TABLET | Freq: Three times a day (TID) | ORAL | 0 refills | Status: DC

## 2023-10-11 MED ORDER — VITAMIN D (ERGOCALCIFEROL) 1.25 MG (50000 UNIT) PO CAPS: 50000.0000 [IU] | ORAL_CAPSULE | ORAL | 3 refills | Status: DC

## 2023-10-18 ENCOUNTER — Ambulatory Visit (HOSPITAL_COMMUNITY)

## 2023-10-24 ENCOUNTER — Ambulatory Visit (HOSPITAL_COMMUNITY)
Admission: RE | Admit: 2023-10-24 | Discharge: 2023-10-24 | Disposition: A | Discharge status: Home / Self Care | Source: Ambulatory Visit | Attending: Family Medicine | Admitting: Family Medicine

## 2023-10-24 ENCOUNTER — Encounter (HOSPITAL_COMMUNITY): Payer: Self-pay | Admitting: Urology

## 2023-10-24 DIAGNOSIS — M51362 Other intervertebral disc degeneration, lumbar region with discogenic back pain and lower extremity pain: Secondary | ICD-10-CM

## 2023-10-24 DIAGNOSIS — E049 Nontoxic goiter, unspecified: Secondary | ICD-10-CM | POA: Insufficient documentation

## 2023-10-26 ENCOUNTER — Other Ambulatory Visit: Payer: Self-pay | Admitting: Family Medicine

## 2023-10-26 DIAGNOSIS — E041 Nontoxic single thyroid nodule: Secondary | ICD-10-CM

## 2023-10-31 ENCOUNTER — Ambulatory Visit: Admitting: Urology

## 2023-10-31 ENCOUNTER — Ambulatory Visit
Admission: RE | Admit: 2023-10-31 | Discharge: 2023-10-31 | Disposition: A | Discharge status: Home / Self Care | Source: Ambulatory Visit | Attending: Family Medicine | Admitting: Family Medicine

## 2023-10-31 ENCOUNTER — Ambulatory Visit (HOSPITAL_COMMUNITY)
Admission: RE | Admit: 2023-10-31 | Discharge: 2023-10-31 | Disposition: A | Discharge status: Home / Self Care | Source: Ambulatory Visit | Attending: Urology | Admitting: Urology

## 2023-10-31 ENCOUNTER — Other Ambulatory Visit (HOSPITAL_COMMUNITY)
Admission: RE | Admit: 2023-10-31 | Discharge: 2023-10-31 | Disposition: A | Discharge status: Home / Self Care | Source: Ambulatory Visit | Attending: Family Medicine | Admitting: Family Medicine

## 2023-10-31 VITALS — BP 145/92 | HR 90

## 2023-10-31 DIAGNOSIS — Z09 Encounter for follow-up examination after completed treatment for conditions other than malignant neoplasm: Secondary | ICD-10-CM

## 2023-10-31 DIAGNOSIS — Z87442 Personal history of urinary calculi: Secondary | ICD-10-CM

## 2023-10-31 DIAGNOSIS — N200 Calculus of kidney: Secondary | ICD-10-CM | POA: Insufficient documentation

## 2023-10-31 DIAGNOSIS — E041 Nontoxic single thyroid nodule: Secondary | ICD-10-CM | POA: Insufficient documentation

## 2023-10-31 LAB — URINALYSIS, ROUTINE W REFLEX MICROSCOPIC
Bilirubin, UA: NEGATIVE
Glucose, UA: NEGATIVE
Ketones, UA: NEGATIVE
Nitrite, UA: NEGATIVE
Protein,UA: NEGATIVE
Specific Gravity, UA: 1.025 (ref 1.005–1.030)
Urobilinogen, Ur: 1 mg/dL (ref 0.2–1.0)
pH, UA: 6 (ref 5.0–7.5)

## 2023-10-31 LAB — MICROSCOPIC EXAMINATION: WBC, UA: >30 /HPF — AB (ref 0–5)

## 2023-10-31 NOTE — Progress Notes (Unsigned)
 10/31/2023 1:56 PM   Sonya Olson [birth year] [id number]  Referring provider: Duanne Butler DASEN, MD [address] [place],  [place] [address]  Followup nephrolithiasis   HPI: Sonya Olson is a 42yo here for followup after ESWL. She has passed multiple small calculi. She denies any flank pain. She denies any worsening LUTSKUB shows no residual calculi   PMH: Past Medical History:  Diagnosis Date   Allergy    Anxiety    Phreesia 03/23/2020   Asthma    Depression    Phreesia 03/23/2020   Eczema    GERD (gastroesophageal reflux disease)    Phreesia 03/23/2020   Hydradenitis    on mtx and remicade through [facility]   Kidney stones 2008   last episode   PCOS (polycystic ovarian syndrome)    Sleep apnea    Phreesia 03/23/2020    Surgical History: Past Surgical History:  Procedure Laterality Date   APPENDECTOMY     EXTRACORPOREAL SHOCK WAVE LITHOTRIPSY Left 10/09/2023   Procedure: LITHOTRIPSY, ESWL;  Surgeon: Sonya Belvie CROME, MD;  Location: [facility];  Service: Urology;  Laterality: Left;   LITHOTRIPSY  2008   NECK SURGERY      Home Medications:  Allergies as of 10/31/2023       Reactions   Other Swelling   Grass, trees, pet dander, dust   Bee Pollen    Dog Epithelium Swelling   Strawberry Extract    Golimumab Itching        Medication List        Accurate as of October 31, 2023  1:56 PM. If you have any questions, ask your nurse or doctor.          acetaminophen  500 MG tablet Commonly known as: TYLENOL  Take 1 tablet (500 mg total) by mouth every 6 (six) hours as needed.   albuterol  108 (90 Base) MCG/ACT inhaler Commonly known as: VENTOLIN  HFA Inhale 1-2 puffs into the lungs every 6 (six) hours as needed for wheezing or shortness of breath.   cyclobenzaprine  10 MG tablet Commonly known as: FLEXERIL  Take 1 tablet (10 mg total) by mouth 3 (three) times daily as needed for muscle spasms.   fluocinonide cream 0.05 % Commonly known as:  LIDEX Apply 1 application topically daily.   folic acid  1 MG tablet Commonly known as: FOLVITE  Take 3 mg by mouth daily.   ibuprofen  600 MG tablet Commonly known as: ADVIL  Take 1 tablet (600 mg total) by mouth every 8 (eight) hours as needed.   inFLIXimab in sodium chloride  0.9 % Inject 5 mg/kg into the vein every 30 (thirty) days. remicade   levocetirizine 5 MG tablet Commonly known as: XYZAL  Take 1 tablet (5 mg total) by mouth every evening.   levonorgestrel -ethinyl estradiol 0.1-20 MG-MCG tablet Commonly known as: ALESSE Take 1 tablet by mouth.   loratadine  10 MG tablet Commonly known as: CLARITIN  Take 10 mg by mouth daily.   LORazepam  0.5 MG tablet Commonly known as: ATIVAN  Take 1 tablet (0.5 mg total) by mouth every 8 (eight) hours.   methotrexate 2.5 MG tablet Commonly known as: RHEUMATREX Take 6 tablets by mouth once a week. Takes on Saturdays   montelukast  10 MG tablet Commonly known as: SINGULAIR  TAKE 1 TABLET BY MOUTH AT BEDTIME   ondansetron  8 MG disintegrating tablet Commonly known as: ZOFRAN -ODT Take 1 tablet (8 mg total) by mouth every 8 (eight) hours as needed for nausea.   oxyCODONE -acetaminophen  5-325 MG tablet Commonly known  as: PERCOCET/ROXICET Take 1 tablet by mouth every 4 (four) hours as needed for severe pain (pain score 7-10).   pantoprazole  40 MG tablet Commonly known as: PROTONIX  Take 1 tablet (40 mg total) by mouth daily.   Remicade 100 MG injection Generic drug: inFLIXimab Inject into the vein.   tamsulosin  0.4 MG Caps capsule Commonly known as: FLOMAX  Take 1 capsule (0.4 mg total) by mouth daily.   traMADol 50 MG tablet Commonly known as: ULTRAM Take 50 mg by mouth every 6 (six) hours as needed.   Vitamin D  (Cholecalciferol) 25 MCG (1000 UT) Tabs Take 2 tablets (2,000 Units total) by mouth daily.   Vitamin D  (Ergocalciferol ) 1.25 MG (50000 UNIT) Caps capsule Commonly known as: DRISDOL  Take 1 capsule (50,000 Units total)  by mouth every 7 (seven) days.   vortioxetine  HBr 10 MG Tabs tablet Commonly known as: Trintellix  Take 1 tablet (10 mg total) by mouth daily.   Vyvanse 40 MG capsule Generic drug: lisdexamfetamine Take 40 mg by mouth every morning.        Allergies:  Allergies  Allergen Reactions   Other Swelling    Grass, trees, pet dander, dust   Bee Pollen    Dog Epithelium Swelling   Strawberry Extract    Golimumab Itching    Family History: Family History  Problem Relation Age of Onset   Hypertension Mother    Diabetes Mother    Heart disease Mother 12       CABG @ 70   Thyroid  disease Sister    Kidney disease Sister        2 kidney transplants   Cancer Sister        OVARIAN   Alcohol abuse Father    Asthma Daughter    Hypertension Maternal Uncle    Diabetes Maternal Uncle    Heart disease Maternal Uncle 24       CABG @ 53   Stroke Maternal Grandmother    Hypertension Maternal Grandfather    Heart disease Maternal Grandfather 44       Died of MI @ 52--No Dxed CAD prior   Mental illness Maternal Uncle     Social History:  reports that she has never smoked. She has never used smokeless tobacco. She reports that she does not drink alcohol and does not use drugs.  ROS: All other review of systems were reviewed and are negative except what is noted above in HPI  Physical Exam: BP (!) 145/92   Pulse 90   Constitutional:  Alert and oriented, No acute distress. HEENT: [place] AT, moist mucus membranes.  Trachea midline, no masses. Cardiovascular: No clubbing, cyanosis, or edema. Respiratory: Normal respiratory effort, no increased work of breathing. GI: Abdomen is soft, nontender, nondistended, no abdominal masses GU: No CVA tenderness.  Lymph: No cervical or inguinal lymphadenopathy. Skin: No rashes, bruises or suspicious lesions. Neurologic: Grossly intact, no focal deficits, moving all 4 extremities. Psychiatric: Normal mood and affect.  Laboratory Data: Lab Results   Component Value Date   WBC 13.3 (H) 09/30/2023   HGB 12.1 09/30/2023   HCT 35.0 (L) 09/30/2023   MCV 75.9 (L) 09/30/2023   PLT 217 09/30/2023    Lab Results  Component Value Date   CREATININE 0.97 09/30/2023    No results found for: PSA  No results found for: TESTOSTERONE  Lab Results  Component Value Date   HGBA1C 5.7 (H) 10/04/2023    Urinalysis    Component Value Date/Time   COLORURINE  YELLOW 09/30/2023 0600   APPEARANCEUR CLEAR 09/30/2023 0600   LABSPEC 1.020 09/30/2023 0600   PHURINE 6.0 09/30/2023 0600   GLUCOSEU NEGATIVE 09/30/2023 0600   HGBUR NEGATIVE 09/30/2023 0600   BILIRUBINUR NEGATIVE 09/30/2023 0600   KETONESUR 20 (A) 09/30/2023 0600   PROTEINUR NEGATIVE 09/30/2023 0600   UROBILINOGEN 0.2 09/17/2009 1833   NITRITE NEGATIVE 09/30/2023 0600   LEUKOCYTESUR TRACE (A) 09/30/2023 0600    Lab Results  Component Value Date   BACTERIA NONE SEEN 09/30/2023    Pertinent Imaging: KUB today: Images reviewed and discussed with the patient  Results for orders placed during the hospital encounter of 10/09/23  DG Abd 1 View  Narrative CLINICAL DATA:  Left kidney stone.  Pre lithotripsy.  EXAM: ABDOMEN - 1 VIEW  COMPARISON:  Abdominal radiograph dated 10/03/2023.  FINDINGS: A 7 mm radiopaque focus along the inferior endplate of L3. There is moderate stool throughout the colon. No bowel dilatation or evidence of obstruction. No free air. Surgical staples in the left upper abdomen. No acute osseous pathology.  IMPRESSION: A 7 mm left proximal ureteral stone.   Electronically Signed By: Vanetta Chou M.D. On: 10/09/2023 09:14  No results found for this or any previous visit.  No results found for this or any previous visit.  No results found for this or any previous visit.  No results found for this or any previous visit.  No results found for this or any previous visit.  No results found for this or any previous visit.  Results  for orders placed during the hospital encounter of 09/30/23  CT Renal Stone Study  Narrative CLINICAL DATA:  Left flank pain. Probable stone. Pain onset last night with nausea.  EXAM: CT ABDOMEN AND PELVIS WITHOUT CONTRAST  TECHNIQUE: Multidetector CT imaging of the abdomen and pelvis was performed following the standard protocol without IV contrast.  RADIATION DOSE REDUCTION: This exam was performed according to the departmental dose-optimization program which includes automated exposure control, adjustment of the mA and/or kV according to patient size and/or use of iterative reconstruction technique.  COMPARISON:  CTs without contrast 01/30/2020 and 12/15/2019  FINDINGS: Lower chest: The cardiac size is normal. There is linear scarring or atelectasis in the lung bases without infiltrate.  Hepatobiliary: The liver is 18.2 cm in length with mild steatosis. No mass is seen without contrast.  There is mild dilatation of the gallbladder without wall thickening or calcified stones. No biliary dilatation.  Pancreas: No mass is seen without contrast.  Spleen: Unremarkable without contrast.  No splenomegaly.  Adrenals/Urinary Tract: There is no adrenal mass. No contour deforming abnormality of either unenhanced kidney.  There is asymmetric left renal and perirenal edema, scattered perinephric fluid, and mild hydronephrosis.  There is a 5 x 5 x 3 mm left UPJ stone superimposing just inferior to the mid left L3 transverse process.  Both ureters are otherwise clear. There is a 2 mm nonobstructive caliceal stone in the inferior pole of the left kidney.  No other intrarenal stones are seen bilaterally. The bladder is unremarkable for the degree of distension.  Stomach/Bowel: Sleeve gastrectomy again noted and there appears to have been an interval gastric bypass.  No wall thickening or dilatation of the GI tract is seen. An appendix is not seen in this patient. It is  probably surgically absent.  There is mild fecal stasis. No evidence of colitis or diverticulitis. Scattered uncomplicated left colonic diverticula.  Vascular/Lymphatic: No significant vascular findings are present. No enlarged  abdominal or pelvic lymph nodes.  Reproductive: Uterus and bilateral adnexa are unremarkable.  Other: None.  Musculoskeletal: No acute or significant osseous findings.  IMPRESSION: 1. 5 x 5 x 3 mm left UPJ stone with mild hydronephrosis, left renal and perirenal edema and scattered perinephric fluid. Correlate clinically for infectious complication. 2. 2 mm nonobstructive caliceal stone inferior pole left kidney. 3. Mild hepatic steatosis. 4. Mild gallbladder dilatation without wall thickening or calcified stones. 5. Constipation and diverticulosis. 6. 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   Electronically Signed By: Francis Quam M.D. On: 09/30/2023 07:48   Assessment & Plan:    1. Kidney stones (Primary) Followup 1 month with KUB  - Urinalysis, Routine w reflex microscopic   No follow-ups on file.  Belvie Clara, MD  [facility] [place]

## 2023-10-31 NOTE — Patient Instructions (Signed)

## 2023-10-31 NOTE — Procedures (Signed)
 PROCEDURE SUMMARY:  Using direct ultrasound guidance, 5 passes were made using 25 g needles into the nodule within the left upper lobe of the thyroid .   Ultrasound was used to confirm needle placements on all occasions.   EBL = trace  Specimens were sent to Pathology for analysis.  See procedure note under Imaging tab in Epic for full procedure details.  Lavanda JAYSON Jurist PA-C 10/31/2023 10:08 AM

## 2023-11-01 ENCOUNTER — Encounter: Payer: Self-pay | Admitting: Urology

## 2023-11-05 LAB — CYTOLOGY - NON PAP

## 2023-11-06 ENCOUNTER — Ambulatory Visit: Payer: Self-pay | Admitting: Family Medicine

## 2023-12-06 ENCOUNTER — Ambulatory Visit: Admitting: Urology

## 2023-12-26 ENCOUNTER — Other Ambulatory Visit: Payer: Self-pay

## 2023-12-26 ENCOUNTER — Telehealth: Payer: Self-pay

## 2023-12-26 MED ORDER — MONTELUKAST SODIUM 10 MG PO TABS: 10.0000 mg | ORAL_TABLET | Freq: Every day | ORAL | 0 refills | Status: DC

## 2023-12-26 NOTE — Telephone Encounter (Signed)
 Medication sent in.

## 2023-12-26 NOTE — Telephone Encounter (Signed)
 Prescription Request  12/26/2023  LOV: 10/04/23  What is the name of the medication or equipment? montelukast  (SINGULAIR ) 10 MG tablet [[id number]]   Have you contacted your pharmacy to request a refill? Yes   Which pharmacy would you like this sent to?  [facility] - [place], [place] - [address] [place] [address] Phone: [phone] Fax: [phone]    Patient notified that their request is being sent to the clinical staff for review and that they should receive a response within 2 business days.   Please advise at [facility] [phone]

## 2024-01-10 ENCOUNTER — Ambulatory Visit: Admitting: Urology

## 2024-02-16 ENCOUNTER — Other Ambulatory Visit: Payer: Self-pay | Admitting: Family Medicine

## 2024-04-04 ENCOUNTER — Other Ambulatory Visit: Payer: Self-pay | Admitting: Family Medicine

## 2024-04-08 ENCOUNTER — Other Ambulatory Visit: Payer: Self-pay | Admitting: Family Medicine

## 2024-05-13 ENCOUNTER — Ambulatory Visit (HOSPITAL_COMMUNITY)
Admission: RE | Admit: 2024-05-13 | Discharge: 2024-05-13 | Disposition: A | Discharge status: Home / Self Care | Source: Ambulatory Visit | Attending: Urology | Admitting: Urology

## 2024-05-13 DIAGNOSIS — N200 Calculus of kidney: Secondary | ICD-10-CM | POA: Insufficient documentation

## 2024-05-14 ENCOUNTER — Ambulatory Visit: Admitting: Urology

## 2024-05-14 VITALS — BP 123/87 | HR 96

## 2024-05-14 DIAGNOSIS — N200 Calculus of kidney: Secondary | ICD-10-CM | POA: Diagnosis not present

## 2024-05-14 NOTE — Patient Instructions (Signed)

## 2024-05-14 NOTE — Progress Notes (Signed)
 "  05/14/2024 10:31 AM   Marko Hoit 03/22/1981 [id number]  Referring provider: Duanne Butler DASEN, MD [address] [place],  [place] [address]  Followup nephrolithiasis   HPI: Ms Sonya Olson is a 44yo here for followup for nephrolithiasis. No stone events since last visit. She denies any flank pain. She drinks 32-64 oz of water daily. No issues urinating. KUb does not show any calculi. No other complaints today   PMH: Past Medical History:  Diagnosis Date   Allergy    Anxiety    Phreesia 03/23/2020   Asthma    Depression    Phreesia 03/23/2020   Eczema    GERD (gastroesophageal reflux disease)    Phreesia 03/23/2020   Hydradenitis    on mtx and remicade through [facility]   Kidney stones 2008   last episode   PCOS (polycystic ovarian syndrome)    Sleep apnea    Phreesia 03/23/2020    Surgical History: Past Surgical History:  Procedure Laterality Date   APPENDECTOMY     EXTRACORPOREAL SHOCK WAVE LITHOTRIPSY Left 10/09/2023   Procedure: LITHOTRIPSY, ESWL;  Surgeon: Sherrilee Belvie CROME, MD;  Location: [facility];  Service: Urology;  Laterality: Left;   LITHOTRIPSY  2008   NECK SURGERY      Home Medications:  Allergies as of 05/14/2024       Reactions   Other Swelling   Grass, trees, pet dander, dust   Bee Pollen    Dog Epithelium Swelling   Strawberry Extract    Golimumab Itching        Medication List        Accurate as of May 14, 2024 10:31 AM. If you have any questions, ask your nurse or doctor.          acetaminophen  500 MG tablet Commonly known as: TYLENOL  Take 1 tablet (500 mg total) by mouth every 6 (six) hours as needed.   albuterol  108 (90 Base) MCG/ACT inhaler Commonly known as: VENTOLIN  HFA Inhale 1-2 puffs into the lungs every 6 (six) hours as needed for wheezing or shortness of breath.   cyclobenzaprine  10 MG tablet Commonly known as: FLEXERIL  Take 1 tablet (10 mg total) by mouth 3 (three) times daily as needed for muscle  spasms.   fluocinonide cream 0.05 % Commonly known as: LIDEX Apply 1 application topically daily.   folic acid  1 MG tablet Commonly known as: FOLVITE  Take 3 mg by mouth daily.   ibuprofen  600 MG tablet Commonly known as: ADVIL  Take 1 tablet (600 mg total) by mouth every 8 (eight) hours as needed.   inFLIXimab in sodium chloride  0.9 % Inject 5 mg/kg into the vein every 30 (thirty) days. remicade   levocetirizine 5 MG tablet Commonly known as: XYZAL  Take 1 tablet (5 mg total) by mouth every evening.   levonorgestrel -ethinyl estradiol 0.1-20 MG-MCG tablet Commonly known as: ALESSE Take 1 tablet by mouth.   loratadine  10 MG tablet Commonly known as: CLARITIN  Take 10 mg by mouth daily.   LORazepam  0.5 MG tablet Commonly known as: ATIVAN  TAKE ONE TABLET BY MOUTH EVERY 8 HOURS   methotrexate 2.5 MG tablet Commonly known as: RHEUMATREX Take 6 tablets by mouth once a week. Takes on Saturdays   montelukast  10 MG tablet Commonly known as: SINGULAIR  Take 1 tablet (10 mg total) by mouth at bedtime.   ondansetron  8 MG disintegrating tablet Commonly known as: ZOFRAN -ODT Take 1 tablet (8 mg total) by mouth every 8 (eight) hours as needed  for nausea.   oxyCODONE -acetaminophen  5-325 MG tablet Commonly known as: PERCOCET/ROXICET Take 1 tablet by mouth every 4 (four) hours as needed for severe pain (pain score 7-10).   pantoprazole  40 MG tablet Commonly known as: PROTONIX  Take 1 tablet (40 mg total) by mouth daily.   Remicade 100 MG injection Generic drug: inFLIXimab Inject into the vein.   tamsulosin  0.4 MG Caps capsule Commonly known as: FLOMAX  Take 1 capsule (0.4 mg total) by mouth daily.   traMADol 50 MG tablet Commonly known as: ULTRAM Take 50 mg by mouth every 6 (six) hours as needed.   Vitamin D  (Cholecalciferol) 25 MCG (1000 UT) Tabs Take 2 tablets (2,000 Units total) by mouth daily.   Vitamin D  (Ergocalciferol ) 1.25 MG (50000 UNIT) Caps capsule Commonly known  as: DRISDOL  Take 1 capsule (50,000 Units total) by mouth every 7 (seven) days.   vortioxetine  HBr 10 MG Tabs tablet Commonly known as: Trintellix  Take 1 tablet (10 mg total) by mouth daily.   Vyvanse 40 MG capsule Generic drug: lisdexamfetamine Take 40 mg by mouth every morning.        Allergies: Allergies[1]  Family History: Family History  Problem Relation Age of Onset   Hypertension Mother    Diabetes Mother    Heart disease Mother 72       CABG @ 47   Thyroid  disease Sister    Kidney disease Sister        2 kidney transplants   Cancer Sister        OVARIAN   Alcohol abuse Father    Asthma Daughter    Hypertension Maternal Uncle    Diabetes Maternal Uncle    Heart disease Maternal Uncle 75       CABG @ 37   Stroke Maternal Grandmother    Hypertension Maternal Grandfather    Heart disease Maternal Grandfather 67       Died of MI @ 52--No Dxed CAD prior   Mental illness Maternal Uncle     Social History:  reports that she has never smoked. She has never used smokeless tobacco. She reports that she does not drink alcohol and does not use drugs.  ROS: All other review of systems were reviewed and are negative except what is noted above in HPI  Physical Exam: BP 123/87   Pulse 96   Constitutional:  Alert and oriented, No acute distress. HEENT:  AT, moist mucus membranes.  Trachea midline, no masses. Cardiovascular: No clubbing, cyanosis, or edema. Respiratory: Normal respiratory effort, no increased work of breathing. GI: Abdomen is soft, nontender, nondistended, no abdominal masses GU: No CVA tenderness.  Lymph: No cervical or inguinal lymphadenopathy. Skin: No rashes, bruises or suspicious lesions. Neurologic: Grossly intact, no focal deficits, moving all 4 extremities. Psychiatric: Normal mood and affect.  Laboratory Data: Lab Results  Component Value Date   WBC 13.3 (H) 09/30/2023   HGB 12.1 09/30/2023   HCT 35.0 (L) 09/30/2023   MCV 75.9 (L)  09/30/2023   PLT 217 09/30/2023    Lab Results  Component Value Date   CREATININE 0.97 09/30/2023    No results found for: PSA  No results found for: TESTOSTERONE  Lab Results  Component Value Date   HGBA1C 5.7 (H) 10/04/2023    Urinalysis    Component Value Date/Time   COLORURINE YELLOW 09/30/2023 0600   APPEARANCEUR Clear 10/31/2023 1344   LABSPEC 1.020 09/30/2023 0600   PHURINE 6.0 09/30/2023 0600   GLUCOSEU Negative 10/31/2023 1344  HGBUR NEGATIVE 09/30/2023 0600   BILIRUBINUR Negative 10/31/2023 1344   KETONESUR 20 (A) 09/30/2023 0600   PROTEINUR Negative 10/31/2023 1344   PROTEINUR NEGATIVE 09/30/2023 0600   UROBILINOGEN 0.2 09/17/2009 1833   NITRITE Negative 10/31/2023 1344   NITRITE NEGATIVE 09/30/2023 0600   LEUKOCYTESUR 2+ (A) 10/31/2023 1344   LEUKOCYTESUR TRACE (A) 09/30/2023 0600    Lab Results  Component Value Date   LABMICR See below: 10/31/2023   WBCUA >30 (A) 10/31/2023   LABEPIT 0-10 10/31/2023   BACTERIA Moderate (A) 10/31/2023    Pertinent Imaging: KUB yesterday: Images reviewed and discussed with the patient Results for orders placed during the hospital encounter of 10/31/23  DG Abd 1 View  Narrative CLINICAL DATA:  Kidney stone.  EXAM: ABDOMEN - 1 VIEW  COMPARISON:  Most recent radiograph 10/09/2023, CT 09/30/2023  FINDINGS: The previous left ureteral calculus is no longer seen. Punctate calcification in the left pelvis may represent a stone fragment from recent lithotripsy. No visualized right-sided urolithiasis. Postsurgical change in the left upper quadrant. Normal bowel gas pattern  IMPRESSION: The previous left ureteral calculus is no longer seen. Punctate calcification in the left pelvis may represent a stone fragment from recent lithotripsy.   Electronically Signed By: Andrea Gasman M.D. On: 11/01/2023 11:03  No results found for this or any previous visit.  No results found for this or any previous  visit.  No results found for this or any previous visit.  No results found for this or any previous visit.  No results found for this or any previous visit.  No results found for this or any previous visit.  Results for orders placed during the hospital encounter of 09/30/23  CT Renal Stone Study  Narrative CLINICAL DATA:  Left flank pain. Probable stone. Pain onset last night with nausea.  EXAM: CT ABDOMEN AND PELVIS WITHOUT CONTRAST  TECHNIQUE: Multidetector CT imaging of the abdomen and pelvis was performed following the standard protocol without IV contrast.  RADIATION DOSE REDUCTION: This exam was performed according to the departmental dose-optimization program which includes automated exposure control, adjustment of the mA and/or kV according to patient size and/or use of iterative reconstruction technique.  COMPARISON:  CTs without contrast 01/30/2020 and 12/15/2019  FINDINGS: Lower chest: The cardiac size is normal. There is linear scarring or atelectasis in the lung bases without infiltrate.  Hepatobiliary: The liver is 18.2 cm in length with mild steatosis. No mass is seen without contrast.  There is mild dilatation of the gallbladder without wall thickening or calcified stones. No biliary dilatation.  Pancreas: No mass is seen without contrast.  Spleen: Unremarkable without contrast.  No splenomegaly.  Adrenals/Urinary Tract: There is no adrenal mass. No contour deforming abnormality of either unenhanced kidney.  There is asymmetric left renal and perirenal edema, scattered perinephric fluid, and mild hydronephrosis.  There is a 5 x 5 x 3 mm left UPJ stone superimposing just inferior to the mid left L3 transverse process.  Both ureters are otherwise clear. There is a 2 mm nonobstructive caliceal stone in the inferior pole of the left kidney.  No other intrarenal stones are seen bilaterally. The bladder is unremarkable for the degree of  distension.  Stomach/Bowel: Sleeve gastrectomy again noted and there appears to have been an interval gastric bypass.  No wall thickening or dilatation of the GI tract is seen. An appendix is not seen in this patient. It is probably surgically absent.  There is mild fecal stasis. No evidence  of colitis or diverticulitis. Scattered uncomplicated left colonic diverticula.  Vascular/Lymphatic: No significant vascular findings are present. No enlarged abdominal or pelvic lymph nodes.  Reproductive: Uterus and bilateral adnexa are unremarkable.  Other: None.  Musculoskeletal: No acute or significant osseous findings.  IMPRESSION: 1. 5 x 5 x 3 mm left UPJ stone with mild hydronephrosis, left renal and perirenal edema and scattered perinephric fluid. Correlate clinically for infectious complication. 2. 2 mm nonobstructive caliceal stone inferior pole left kidney. 3. Mild hepatic steatosis. 4. Mild gallbladder dilatation without wall thickening or calcified stones. 5. Constipation and diverticulosis. 6. 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   Electronically Signed By: Francis Quam M.D. On: 09/30/2023 07:48   Assessment & Plan:    1. Kidney stones (Primary) Followup 1 year with a KUB Dietary handout given - Urinalysis, Routine w reflex microscopic   No follow-ups on file.  Belvie Clara, MD  [facility] [place]       [1]  Allergies Allergen Reactions   Other Swelling    Grass, trees, pet dander, dust   Bee Pollen    Dog Epithelium Swelling   Strawberry Extract    Golimumab Itching   "

## 2024-05-15 ENCOUNTER — Encounter: Payer: Self-pay | Admitting: Urology

## 2024-05-27 ENCOUNTER — Emergency Department (HOSPITAL_COMMUNITY)
Admission: EM | Admit: 2024-05-27 | Discharge: 2024-05-28 | Disposition: A | Attending: Emergency Medicine | Admitting: Emergency Medicine

## 2024-05-27 ENCOUNTER — Other Ambulatory Visit: Payer: Self-pay

## 2024-05-27 ENCOUNTER — Encounter (HOSPITAL_COMMUNITY): Payer: Self-pay

## 2024-05-27 DIAGNOSIS — N23 Unspecified renal colic: Secondary | ICD-10-CM

## 2024-05-27 DIAGNOSIS — N132 Hydronephrosis with renal and ureteral calculous obstruction: Secondary | ICD-10-CM | POA: Insufficient documentation

## 2024-05-27 DIAGNOSIS — R10A1 Flank pain, right side: Secondary | ICD-10-CM | POA: Diagnosis present

## 2024-05-27 MED ORDER — ONDANSETRON HCL 4 MG/2ML IJ SOLN
4.0000 mg | Freq: Once | INTRAMUSCULAR | Status: AC
  Administered 2024-05-27: 4 mg via INTRAVENOUS
  Filled 2024-05-27: qty 2

## 2024-05-27 MED ORDER — HYDROMORPHONE HCL 1 MG/ML IJ SOLN
1.0000 mg | Freq: Once | INTRAMUSCULAR | Status: AC
  Administered 2024-05-27: 1 mg via INTRAVENOUS
  Filled 2024-05-27: qty 1

## 2024-05-27 NOTE — ED Triage Notes (Signed)
 Pt to ED from home with c/o right sided flank pain that started this evening with nausea and vomiting, pt with hx of kidney stone.

## 2024-05-27 NOTE — ED Provider Notes (Signed)
 " [place] [facility] DE[facility]MENT AT [facility] Provider Note   CSN: [id number] Arrival date & time: 05/27/24  2302     Patient presents with: Flank Pain   Sonya Olson is a 44 y.o. female.  {Add pertinent medical, surgical, social history, OB history to YEP:[id number]} Presents to the emergency department for evaluation of right flank pain.  Patient reports a history of kidney stones, had sudden onset of right flank pain with nausea and vomiting tonight.  Feels similar to stones in the past.       Prior to Admission medications  Medication Sig Start Date End Date Taking? Authorizing Provider  acetaminophen  (TYLENOL ) 500 MG tablet Take 1 tablet (500 mg total) by mouth every 6 (six) hours as needed. 09/30/23   Charlyn Sora, MD  albuterol  (VENTOLIN  HFA) 108 (90 Base) MCG/ACT inhaler Inhale 1-2 puffs into the lungs every 6 (six) hours as needed for wheezing or shortness of breath. 04/28/21   Chandra Harlene LABOR, NP  cyclobenzaprine  (FLEXERIL ) 10 MG tablet Take 1 tablet (10 mg total) by mouth 3 (three) times daily as needed for muscle spasms. 06/20/23   Howard, Amber S, FNP  fluocinonide cream (LIDEX) 0.05 % Apply 1 application topically daily.    [provider]  folic acid  (FOLVITE ) 1 MG tablet Take 3 mg by mouth daily. 11/19/19   [provider]  ibuprofen  (ADVIL ) 600 MG tablet Take 1 tablet (600 mg total) by mouth every 8 (eight) hours as needed. 09/30/23   Nanavati, Ankit, MD  inFLIXimab in sodium chloride  0.9 % Inject 5 mg/kg into the vein every 30 (thirty) days. remicade    [provider]  levocetirizine (XYZAL ) 5 MG tablet Take 1 tablet (5 mg total) by mouth every evening. 10/10/23   Duanne Butler DASEN, MD  levonorgestrel -ethinyl estradiol (AVIANE,ALESSE,LESSINA) 0.1-20 MG-MCG tablet Take 1 tablet by mouth.    [provider]  loratadine  (CLARITIN ) 10 MG tablet Take 10 mg by mouth daily.    [provider]  LORazepam  (ATIVAN ) 0.5 MG  tablet TAKE ONE TABLET BY MOUTH EVERY 8 HOURS 04/09/24   Duanne Butler DASEN, MD  methotrexate (RHEUMATREX) 2.5 MG tablet Take 6 tablets by mouth once a week. Takes on Saturdays 02/18/18   [provider]  montelukast  (SINGULAIR ) 10 MG tablet Take 1 tablet (10 mg total) by mouth at bedtime. 04/07/24   Duanne Butler DASEN, MD  ondansetron  (ZOFRAN -ODT) 8 MG disintegrating tablet Take 1 tablet (8 mg total) by mouth every 8 (eight) hours as needed for nausea. 10/09/23   McKenzie, Belvie CROME, MD  oxyCODONE -acetaminophen  (PERCOCET/ROXICET) 5-325 MG tablet Take 1 tablet by mouth every 4 (four) hours as needed for severe pain (pain score 7-10). 10/09/23   McKenzie, Belvie CROME, MD  pantoprazole  (PROTONIX ) 40 MG tablet Take 1 tablet (40 mg total) by mouth daily. 10/10/23   Duanne Butler DASEN, MD  REMICADE 100 MG injection Inject into the vein. 05/23/21   [provider]  tamsulosin  (FLOMAX ) 0.4 MG CAPS capsule Take 1 capsule (0.4 mg total) by mouth daily. 10/09/23   McKenzie, Belvie CROME, MD  traMADol (ULTRAM) 50 MG tablet Take 50 mg by mouth every 6 (six) hours as needed. 12/13/20   [provider]  Vitamin D , Cholecalciferol, 25 MCG (1000 UT) TABS Take 2 tablets (2,000 Units total) by mouth daily. 07/31/23   Duanne Butler DASEN, MD  Vitamin D , Ergocalciferol , (DRISDOL ) 1.25 MG (50000 UNIT) CAPS capsule Take 1 capsule (50,000 Units total) by mouth  every 7 (seven) days. 02/19/24   Duanne Butler DASEN, MD  vortioxetine  HBr (TRINTELLIX ) 10 MG TABS tablet Take 1 tablet (10 mg total) by mouth daily. 10/10/23   Duanne Butler DASEN, MD  VYVANSE 40 MG capsule Take 40 mg by mouth every morning. 09/04/22   [provider]    Allergies: Other, Bee pollen, Dog epithelium, Strawberry extract, and Golimumab    Review of Systems  Updated Vital Signs BP (!) 166/102 (BP Location: Right Arm)   Pulse 86   Temp 99 F (37.2 C) (Oral)   Resp 18   Ht 5' 7 (1.702 m)   Wt 115.6 kg   LMP 04/26/2024 (Approximate)    SpO2 99%   BMI 39.92 kg/m   Physical Exam Vitals and nursing note reviewed.  Constitutional:      General: She is in acute distress.     Appearance: She is well-developed.  HENT:     Head: Normocephalic and atraumatic.     Mouth/Throat:     Mouth: Mucous membranes are moist.  Eyes:     General: Vision grossly intact. Gaze aligned appropriately.     Extraocular Movements: Extraocular movements intact.     Conjunctiva/sclera: Conjunctivae normal.  Cardiovascular:     Rate and Rhythm: Normal rate and regular rhythm.     Pulses: Normal pulses.     Heart sounds: Normal heart sounds, S1 normal and S2 normal. No murmur heard.    No friction rub. No gallop.  Pulmonary:     Effort: Pulmonary effort is normal. No respiratory distress.     Breath sounds: Normal breath sounds.  Abdominal:     General: Bowel sounds are normal.     Palpations: Abdomen is soft.     Tenderness: There is no abdominal tenderness. There is no guarding or rebound.     Hernia: No hernia is present.  Musculoskeletal:        General: No swelling.     Cervical back: Full passive range of motion without pain, normal range of motion and neck supple. No spinous process tenderness or muscular tenderness. Normal range of motion.     Right lower leg: No edema.     Left lower leg: No edema.  Skin:    General: Skin is warm and dry.     Capillary Refill: Capillary refill takes less than 2 seconds.     Findings: No ecchymosis, erythema, rash or wound.  Neurological:     General: No focal deficit present.     Mental Status: She is alert and oriented to person, place, and time.     GCS: GCS eye subscore is 4. GCS verbal subscore is 5. GCS motor subscore is 6.     Cranial Nerves: Cranial nerves 2-12 are intact.     Sensory: Sensation is intact.     Motor: Motor function is intact.     Coordination: Coordination is intact.  Psychiatric:        Attention and Perception: Attention normal.        Mood and Affect: Mood  normal.        Speech: Speech normal.        Behavior: Behavior normal.     (all labs ordered are listed, but only abnormal results are displayed) Labs Reviewed  URINALYSIS, ROUTINE W REFLEX MICROSCOPIC  BASIC METABOLIC PANEL WITH GFR  CBC  POC URINE PREG, ED    EKG: None  Radiology: No results found.  {Document cardiac monitor, telemetry assessment  procedure when appropriate:32947} Procedures   Medications Ordered in the ED  ondansetron  (ZOFRAN ) injection 4 mg (has no administration in time range)  HYDROmorphone  (DILAUDID ) injection 1 mg (has no administration in time range)      {Click here for ABCD2, HEART and other calculators REFRESH Note before signing:1}                              Medical Decision Making Amount and/or Complexity of Data Reviewed Labs: ordered.  Risk Prescription drug management.   ***  {Document critical care time when appropriate  Document review of labs and clinical decision tools ie CHADS2VASC2, etc  Document your independent review of radiology images and any outside records  Document your discussion with family members, caretakers and with consultants  Document social determinants of health affecting pt's care  Document your decision making why or why not admission, treatments were needed:32947:::1}   Final diagnoses:  None    ED Discharge Orders     None        "

## 2024-05-28 ENCOUNTER — Emergency Department (HOSPITAL_COMMUNITY)

## 2024-05-28 LAB — URINALYSIS, ROUTINE W REFLEX MICROSCOPIC
Bacteria, UA: NONE SEEN
Bilirubin Urine: NEGATIVE
Glucose, UA: NEGATIVE mg/dL
Ketones, ur: 20 mg/dL — AB
Leukocytes,Ua: NEGATIVE
Nitrite: NEGATIVE
Protein, ur: NEGATIVE mg/dL
RBC / HPF: >50 RBC/hpf (ref 0–5)
Specific Gravity, Urine: 1.016 (ref 1.005–1.030)
pH: 5 (ref 5.0–8.0)

## 2024-05-28 LAB — BASIC METABOLIC PANEL WITH GFR
Anion gap: 14 (ref 5–15)
BUN: 11 mg/dL (ref 6–20)
CO2: 17 mmol/L — ABNORMAL LOW (ref 22–32)
Calcium: 8.6 mg/dL — ABNORMAL LOW (ref 8.9–10.3)
Chloride: 105 mmol/L (ref 98–111)
Creatinine, Ser: 0.81 mg/dL (ref 0.44–1.00)
GFR, Estimated: >60 mL/min
Glucose, Bld: 103 mg/dL — ABNORMAL HIGH (ref 70–99)
Potassium: 3.9 mmol/L (ref 3.5–5.1)
Sodium: 136 mmol/L (ref 135–145)

## 2024-05-28 LAB — HCG, SERUM, QUALITATIVE: Preg, Serum: NEGATIVE

## 2024-05-28 LAB — CBC
HCT: 37.6 % (ref 36.0–46.0)
Hemoglobin: 13 g/dL (ref 12.0–15.0)
MCH: 26.2 pg (ref 26.0–34.0)
MCHC: 34.6 g/dL (ref 30.0–36.0)
MCV: 75.7 fL — ABNORMAL LOW (ref 80.0–100.0)
Platelets: 240 10*3/uL (ref 150–400)
RBC: 4.97 MIL/uL (ref 3.87–5.11)
RDW: 15.9 % — ABNORMAL HIGH (ref 11.5–15.5)
WBC: 10.6 10*3/uL — ABNORMAL HIGH (ref 4.0–10.5)
nRBC: 0 % (ref 0.0–0.2)

## 2024-05-28 MED ORDER — OXYCODONE-ACETAMINOPHEN 5-325 MG PO TABS: 1.0000 | ORAL_TABLET | Freq: Four times a day (QID) | ORAL | 0 refills | Status: AC | PRN

## 2024-05-28 MED ORDER — KETOROLAC TROMETHAMINE 30 MG/ML IJ SOLN
15.0000 mg | Freq: Once | INTRAMUSCULAR | Status: AC
  Administered 2024-05-28: 15 mg via INTRAVENOUS
  Filled 2024-05-28: qty 1

## 2024-05-28 MED ORDER — METOCLOPRAMIDE HCL 5 MG/ML IJ SOLN
10.0000 mg | Freq: Once | INTRAMUSCULAR | Status: AC
  Administered 2024-05-28: 10 mg via INTRAVENOUS
  Filled 2024-05-28: qty 2

## 2024-05-28 NOTE — ED Notes (Signed)
 Pt in CT.

## 2024-05-29 MED FILL — Oxycodone w/ Acetaminophen Tab 5-325 MG: ORAL | Qty: 6 | Status: AC

## 2024-10-06 ENCOUNTER — Encounter: Admitting: Family Medicine
